# Patient Record
Sex: Female | Born: 1935
Health system: Southern US, Community
[De-identification: ages and names within clinical notes are randomized; demographics above are authoritative.]

## PROBLEM LIST (undated history)

## (undated) DIAGNOSIS — I10 Essential (primary) hypertension: Secondary | ICD-10-CM

## (undated) DIAGNOSIS — R7989 Other specified abnormal findings of blood chemistry: Secondary | ICD-10-CM

## (undated) DIAGNOSIS — M6282 Rhabdomyolysis: Secondary | ICD-10-CM

## (undated) DIAGNOSIS — E785 Hyperlipidemia, unspecified: Secondary | ICD-10-CM

## (undated) DIAGNOSIS — I639 Cerebral infarction, unspecified: Secondary | ICD-10-CM

## (undated) DIAGNOSIS — N289 Disorder of kidney and ureter, unspecified: Secondary | ICD-10-CM

## (undated) HISTORY — DX: Hyperlipidemia, unspecified: E78.5

## (undated) HISTORY — DX: Other specified abnormal findings of blood chemistry: R79.89

---

## 1978-05-18 HISTORY — PX: BREAST SURGERY: SHX581

## 1997-11-07 ENCOUNTER — Ambulatory Visit (HOSPITAL_COMMUNITY): Admission: RE | Admit: 1997-11-07 | Discharge: 1997-11-08 | Payer: Self-pay | Admitting: Cardiology

## 1998-02-20 ENCOUNTER — Encounter: Payer: Self-pay | Admitting: Endocrinology

## 1998-02-20 ENCOUNTER — Ambulatory Visit (HOSPITAL_COMMUNITY): Admission: RE | Admit: 1998-02-20 | Discharge: 1998-02-20 | Payer: Self-pay | Admitting: Endocrinology

## 1998-09-11 ENCOUNTER — Ambulatory Visit (HOSPITAL_COMMUNITY): Admission: RE | Admit: 1998-09-11 | Discharge: 1998-09-11 | Payer: Self-pay | Admitting: *Deleted

## 1999-03-17 ENCOUNTER — Encounter: Payer: Self-pay | Admitting: Endocrinology

## 1999-03-17 ENCOUNTER — Ambulatory Visit (HOSPITAL_COMMUNITY): Admission: RE | Admit: 1999-03-17 | Discharge: 1999-03-17 | Payer: Self-pay | Admitting: Endocrinology

## 1999-06-27 ENCOUNTER — Encounter: Payer: Self-pay | Admitting: Endocrinology

## 1999-06-27 ENCOUNTER — Ambulatory Visit (HOSPITAL_COMMUNITY): Admission: RE | Admit: 1999-06-27 | Discharge: 1999-06-27 | Payer: Self-pay | Admitting: Endocrinology

## 1999-11-25 ENCOUNTER — Ambulatory Visit (HOSPITAL_COMMUNITY): Admission: RE | Admit: 1999-11-25 | Discharge: 1999-11-25 | Payer: Self-pay | Admitting: Cardiology

## 2000-01-08 ENCOUNTER — Encounter: Payer: Self-pay | Admitting: Orthopedic Surgery

## 2000-01-12 ENCOUNTER — Inpatient Hospital Stay (HOSPITAL_COMMUNITY): Admission: RE | Admit: 2000-01-12 | Discharge: 2000-01-16 | Payer: Self-pay | Admitting: Orthopedic Surgery

## 2000-01-16 ENCOUNTER — Inpatient Hospital Stay (HOSPITAL_COMMUNITY)
Admission: RE | Admit: 2000-01-16 | Discharge: 2000-01-23 | Payer: Self-pay | Admitting: Physical Medicine & Rehabilitation

## 2000-01-22 ENCOUNTER — Encounter: Payer: Self-pay | Admitting: Physical Medicine & Rehabilitation

## 2000-03-04 ENCOUNTER — Encounter: Admission: RE | Admit: 2000-03-04 | Discharge: 2000-03-15 | Payer: Self-pay | Admitting: Orthopedic Surgery

## 2000-03-24 ENCOUNTER — Ambulatory Visit (HOSPITAL_COMMUNITY): Admission: RE | Admit: 2000-03-24 | Discharge: 2000-03-24 | Payer: Self-pay | Admitting: Orthopedic Surgery

## 2000-03-24 ENCOUNTER — Encounter: Payer: Self-pay | Admitting: Orthopedic Surgery

## 2000-04-06 ENCOUNTER — Encounter: Payer: Self-pay | Admitting: Orthopedic Surgery

## 2000-04-07 ENCOUNTER — Ambulatory Visit (HOSPITAL_COMMUNITY): Admission: RE | Admit: 2000-04-07 | Discharge: 2000-04-08 | Payer: Self-pay | Admitting: Orthopedic Surgery

## 2000-07-14 ENCOUNTER — Ambulatory Visit (HOSPITAL_BASED_OUTPATIENT_CLINIC_OR_DEPARTMENT_OTHER): Admission: RE | Admit: 2000-07-14 | Discharge: 2000-07-15 | Payer: Self-pay | Admitting: Orthopedic Surgery

## 2000-08-16 ENCOUNTER — Encounter (HOSPITAL_COMMUNITY): Payer: Self-pay | Admitting: Oncology

## 2000-08-16 ENCOUNTER — Ambulatory Visit (HOSPITAL_COMMUNITY): Admission: RE | Admit: 2000-08-16 | Discharge: 2000-08-16 | Payer: Self-pay | Admitting: Oncology

## 2000-09-20 ENCOUNTER — Encounter: Payer: Self-pay | Admitting: Endocrinology

## 2000-09-20 ENCOUNTER — Ambulatory Visit (HOSPITAL_COMMUNITY): Admission: RE | Admit: 2000-09-20 | Discharge: 2000-09-20 | Payer: Self-pay | Admitting: Endocrinology

## 2001-05-14 ENCOUNTER — Encounter: Payer: Self-pay | Admitting: Emergency Medicine

## 2001-05-15 ENCOUNTER — Encounter: Payer: Self-pay | Admitting: Orthopedic Surgery

## 2001-05-15 ENCOUNTER — Observation Stay (HOSPITAL_COMMUNITY): Admission: EM | Admit: 2001-05-15 | Discharge: 2001-05-15 | Payer: Self-pay | Admitting: Emergency Medicine

## 2001-09-21 ENCOUNTER — Encounter: Admission: RE | Admit: 2001-09-21 | Discharge: 2001-09-21 | Payer: Self-pay | Admitting: Endocrinology

## 2001-09-21 ENCOUNTER — Encounter: Payer: Self-pay | Admitting: Endocrinology

## 2001-09-22 ENCOUNTER — Ambulatory Visit (HOSPITAL_COMMUNITY): Admission: RE | Admit: 2001-09-22 | Discharge: 2001-09-22 | Payer: Self-pay | Admitting: *Deleted

## 2001-12-05 ENCOUNTER — Inpatient Hospital Stay (HOSPITAL_COMMUNITY): Admission: RE | Admit: 2001-12-05 | Discharge: 2001-12-07 | Payer: Self-pay | Admitting: Orthopedic Surgery

## 2001-12-05 ENCOUNTER — Encounter: Payer: Self-pay | Admitting: Orthopedic Surgery

## 2002-01-30 ENCOUNTER — Encounter: Admission: RE | Admit: 2002-01-30 | Discharge: 2002-03-29 | Payer: Self-pay | Admitting: Orthopedic Surgery

## 2003-05-03 ENCOUNTER — Ambulatory Visit (HOSPITAL_COMMUNITY): Admission: RE | Admit: 2003-05-03 | Discharge: 2003-05-03 | Payer: Self-pay | Admitting: *Deleted

## 2003-06-27 ENCOUNTER — Encounter: Admission: RE | Admit: 2003-06-27 | Discharge: 2003-06-27 | Payer: Self-pay | Admitting: Endocrinology

## 2003-11-22 ENCOUNTER — Ambulatory Visit (HOSPITAL_COMMUNITY): Admission: RE | Admit: 2003-11-22 | Discharge: 2003-11-22 | Payer: Self-pay | Admitting: Cardiology

## 2003-11-30 ENCOUNTER — Inpatient Hospital Stay (HOSPITAL_COMMUNITY): Admission: RE | Admit: 2003-11-30 | Discharge: 2003-12-04 | Payer: Self-pay | Admitting: Orthopedic Surgery

## 2003-12-31 ENCOUNTER — Encounter: Admission: RE | Admit: 2003-12-31 | Discharge: 2004-02-19 | Payer: Self-pay | Admitting: Orthopedic Surgery

## 2004-08-06 ENCOUNTER — Encounter: Admission: RE | Admit: 2004-08-06 | Discharge: 2004-08-06 | Payer: Self-pay | Admitting: Endocrinology

## 2004-12-24 ENCOUNTER — Ambulatory Visit (HOSPITAL_COMMUNITY): Admission: RE | Admit: 2004-12-24 | Discharge: 2004-12-24 | Payer: Self-pay | Admitting: *Deleted

## 2005-09-18 ENCOUNTER — Encounter: Admission: RE | Admit: 2005-09-18 | Discharge: 2005-09-18 | Payer: Self-pay | Admitting: Endocrinology

## 2011-08-04 DIAGNOSIS — L608 Other nail disorders: Secondary | ICD-10-CM | POA: Diagnosis not present

## 2011-08-04 DIAGNOSIS — E1149 Type 2 diabetes mellitus with other diabetic neurological complication: Secondary | ICD-10-CM | POA: Diagnosis not present

## 2011-10-21 DIAGNOSIS — I10 Essential (primary) hypertension: Secondary | ICD-10-CM | POA: Diagnosis not present

## 2011-10-21 DIAGNOSIS — M109 Gout, unspecified: Secondary | ICD-10-CM | POA: Diagnosis not present

## 2011-10-21 DIAGNOSIS — E789 Disorder of lipoprotein metabolism, unspecified: Secondary | ICD-10-CM | POA: Diagnosis not present

## 2011-10-21 DIAGNOSIS — N390 Urinary tract infection, site not specified: Secondary | ICD-10-CM | POA: Diagnosis not present

## 2011-12-08 DIAGNOSIS — R5381 Other malaise: Secondary | ICD-10-CM | POA: Diagnosis not present

## 2011-12-08 DIAGNOSIS — E789 Disorder of lipoprotein metabolism, unspecified: Secondary | ICD-10-CM | POA: Diagnosis not present

## 2011-12-08 DIAGNOSIS — R5383 Other fatigue: Secondary | ICD-10-CM | POA: Diagnosis not present

## 2011-12-08 DIAGNOSIS — M79609 Pain in unspecified limb: Secondary | ICD-10-CM | POA: Diagnosis not present

## 2011-12-14 DIAGNOSIS — E875 Hyperkalemia: Secondary | ICD-10-CM | POA: Diagnosis not present

## 2011-12-25 DIAGNOSIS — Q828 Other specified congenital malformations of skin: Secondary | ICD-10-CM | POA: Diagnosis not present

## 2011-12-25 DIAGNOSIS — M79609 Pain in unspecified limb: Secondary | ICD-10-CM | POA: Diagnosis not present

## 2011-12-25 DIAGNOSIS — B351 Tinea unguium: Secondary | ICD-10-CM | POA: Diagnosis not present

## 2012-03-25 DIAGNOSIS — Q828 Other specified congenital malformations of skin: Secondary | ICD-10-CM | POA: Diagnosis not present

## 2012-03-25 DIAGNOSIS — M79609 Pain in unspecified limb: Secondary | ICD-10-CM | POA: Diagnosis not present

## 2012-03-25 DIAGNOSIS — B351 Tinea unguium: Secondary | ICD-10-CM | POA: Diagnosis not present

## 2012-04-13 DIAGNOSIS — E789 Disorder of lipoprotein metabolism, unspecified: Secondary | ICD-10-CM | POA: Diagnosis not present

## 2012-04-13 DIAGNOSIS — I10 Essential (primary) hypertension: Secondary | ICD-10-CM | POA: Diagnosis not present

## 2012-04-21 DIAGNOSIS — Z853 Personal history of malignant neoplasm of breast: Secondary | ICD-10-CM | POA: Diagnosis not present

## 2012-04-21 DIAGNOSIS — C50919 Malignant neoplasm of unspecified site of unspecified female breast: Secondary | ICD-10-CM | POA: Diagnosis not present

## 2012-04-21 DIAGNOSIS — I700 Atherosclerosis of aorta: Secondary | ICD-10-CM | POA: Diagnosis not present

## 2012-04-21 DIAGNOSIS — IMO0001 Reserved for inherently not codable concepts without codable children: Secondary | ICD-10-CM | POA: Diagnosis not present

## 2012-04-21 DIAGNOSIS — M949 Disorder of cartilage, unspecified: Secondary | ICD-10-CM | POA: Diagnosis not present

## 2012-04-21 DIAGNOSIS — M899 Disorder of bone, unspecified: Secondary | ICD-10-CM | POA: Diagnosis not present

## 2012-04-21 DIAGNOSIS — E789 Disorder of lipoprotein metabolism, unspecified: Secondary | ICD-10-CM | POA: Diagnosis not present

## 2012-06-22 DIAGNOSIS — I10 Essential (primary) hypertension: Secondary | ICD-10-CM | POA: Diagnosis not present

## 2012-06-22 DIAGNOSIS — E789 Disorder of lipoprotein metabolism, unspecified: Secondary | ICD-10-CM | POA: Diagnosis not present

## 2012-06-22 DIAGNOSIS — M109 Gout, unspecified: Secondary | ICD-10-CM | POA: Diagnosis not present

## 2012-07-29 DIAGNOSIS — Q828 Other specified congenital malformations of skin: Secondary | ICD-10-CM | POA: Diagnosis not present

## 2012-07-29 DIAGNOSIS — E1149 Type 2 diabetes mellitus with other diabetic neurological complication: Secondary | ICD-10-CM | POA: Diagnosis not present

## 2012-09-21 DIAGNOSIS — M109 Gout, unspecified: Secondary | ICD-10-CM | POA: Diagnosis not present

## 2012-09-21 DIAGNOSIS — E789 Disorder of lipoprotein metabolism, unspecified: Secondary | ICD-10-CM | POA: Diagnosis not present

## 2012-09-21 DIAGNOSIS — I10 Essential (primary) hypertension: Secondary | ICD-10-CM | POA: Diagnosis not present

## 2012-11-08 DIAGNOSIS — L600 Ingrowing nail: Secondary | ICD-10-CM | POA: Diagnosis not present

## 2012-11-08 DIAGNOSIS — E1149 Type 2 diabetes mellitus with other diabetic neurological complication: Secondary | ICD-10-CM | POA: Diagnosis not present

## 2012-11-08 DIAGNOSIS — Q828 Other specified congenital malformations of skin: Secondary | ICD-10-CM | POA: Diagnosis not present

## 2012-11-30 ENCOUNTER — Other Ambulatory Visit: Payer: Self-pay | Admitting: Endocrinology

## 2012-11-30 DIAGNOSIS — I10 Essential (primary) hypertension: Secondary | ICD-10-CM | POA: Diagnosis not present

## 2012-11-30 DIAGNOSIS — R1310 Dysphagia, unspecified: Secondary | ICD-10-CM

## 2012-11-30 DIAGNOSIS — M109 Gout, unspecified: Secondary | ICD-10-CM | POA: Diagnosis not present

## 2012-11-30 DIAGNOSIS — E789 Disorder of lipoprotein metabolism, unspecified: Secondary | ICD-10-CM | POA: Diagnosis not present

## 2012-11-30 DIAGNOSIS — C50919 Malignant neoplasm of unspecified site of unspecified female breast: Secondary | ICD-10-CM | POA: Diagnosis not present

## 2012-12-09 ENCOUNTER — Other Ambulatory Visit: Payer: Self-pay

## 2012-12-21 DIAGNOSIS — M109 Gout, unspecified: Secondary | ICD-10-CM | POA: Diagnosis not present

## 2012-12-21 DIAGNOSIS — I10 Essential (primary) hypertension: Secondary | ICD-10-CM | POA: Diagnosis not present

## 2012-12-21 DIAGNOSIS — IMO0001 Reserved for inherently not codable concepts without codable children: Secondary | ICD-10-CM | POA: Diagnosis not present

## 2013-01-04 DIAGNOSIS — M109 Gout, unspecified: Secondary | ICD-10-CM | POA: Diagnosis not present

## 2013-01-04 DIAGNOSIS — I10 Essential (primary) hypertension: Secondary | ICD-10-CM | POA: Diagnosis not present

## 2013-01-04 DIAGNOSIS — M353 Polymyalgia rheumatica: Secondary | ICD-10-CM | POA: Diagnosis not present

## 2013-02-07 DIAGNOSIS — Q828 Other specified congenital malformations of skin: Secondary | ICD-10-CM | POA: Diagnosis not present

## 2013-02-07 DIAGNOSIS — E1149 Type 2 diabetes mellitus with other diabetic neurological complication: Secondary | ICD-10-CM | POA: Diagnosis not present

## 2013-04-06 DIAGNOSIS — M79609 Pain in unspecified limb: Secondary | ICD-10-CM | POA: Diagnosis not present

## 2013-04-06 DIAGNOSIS — R82998 Other abnormal findings in urine: Secondary | ICD-10-CM | POA: Diagnosis not present

## 2013-04-06 DIAGNOSIS — Z23 Encounter for immunization: Secondary | ICD-10-CM | POA: Diagnosis not present

## 2013-04-06 DIAGNOSIS — M109 Gout, unspecified: Secondary | ICD-10-CM | POA: Diagnosis not present

## 2013-05-16 ENCOUNTER — Ambulatory Visit (INDEPENDENT_AMBULATORY_CARE_PROVIDER_SITE_OTHER): Payer: Medicare Other

## 2013-05-16 VITALS — BP 169/70 | HR 80 | Resp 12

## 2013-05-16 DIAGNOSIS — M2040 Other hammer toe(s) (acquired), unspecified foot: Secondary | ICD-10-CM

## 2013-05-16 DIAGNOSIS — E1142 Type 2 diabetes mellitus with diabetic polyneuropathy: Secondary | ICD-10-CM | POA: Diagnosis not present

## 2013-05-16 DIAGNOSIS — Q828 Other specified congenital malformations of skin: Secondary | ICD-10-CM | POA: Diagnosis not present

## 2013-05-16 DIAGNOSIS — E1140 Type 2 diabetes mellitus with diabetic neuropathy, unspecified: Secondary | ICD-10-CM

## 2013-05-16 DIAGNOSIS — L608 Other nail disorders: Secondary | ICD-10-CM

## 2013-05-16 DIAGNOSIS — M79609 Pain in unspecified limb: Secondary | ICD-10-CM

## 2013-05-16 DIAGNOSIS — E1149 Type 2 diabetes mellitus with other diabetic neurological complication: Secondary | ICD-10-CM | POA: Diagnosis not present

## 2013-05-16 DIAGNOSIS — M2041 Other hammer toe(s) (acquired), right foot: Secondary | ICD-10-CM

## 2013-05-16 NOTE — Progress Notes (Signed)
   Subjective:    Patient ID: Toni Gomez, female    DOB: September 02, 1923, 79 y.o.   MRN: [id number]  HPI Comments: ''Toenails and corn trim.''     Review of Systems deferred at this visit     Objective:   Physical Exam Neurovascular status is intact as follows pedal pulses palpable dorsalis pedis pulse one over 4 bilateral PT thready nonpalpable bilateral. Capillary fill time 4 seconds all digits. Skin temperature warm turgor diminished no edema rubor noted mild varicosities noted bilateral. Neurologically epicritic and proprioceptive sensations intact and symmetric bilateral sensations diminished on Semmes Weinstein testing to the digits and plantar forefoot. Orthopedic biomechanical ex deviation digits overlapping is noted. The digits are rigidly contracted. There is associated keratoses distal clavus second digit right foot due to hammertoe deformity there is hemorrhage a keratoses no acute ulceration noted.am remarkable for severe digiDermatologically nails thick brittle crumbly friable discolored x10 also distal clavus second right. No open wounds or ulcerations occurred visit. Digital contractures HAV deformity bilateral       Assessment & Plan:  Assessment this time his diabetes with peripheral neuropathy. Severe arthropathy digital contractures HAV deformity bilateral. Nails thick brittle friable criptotic debrided x10 the presence of diabetes and complications. Also debridement of distal clavus second right and dispensed tubercle padding to cushion the toe. Maintain accommodative shoe gear reappointed in 3 months for continued palliative care  Alvan Dame DPM

## 2013-05-16 NOTE — Patient Instructions (Signed)
Diabetes and Foot Care Diabetes may cause you to have problems because of poor blood supply (circulation) to your feet and legs. This may cause the skin on your feet to become thinner, break easier, and heal more slowly. Your skin may become dry, and the skin may peel and crack. You may also have nerve damage in your legs and feet causing decreased feeling in them. You may not notice minor injuries to your feet that could lead to infections or more serious problems. Taking care of your feet is one of the most important things you can do for yourself.  HOME CARE INSTRUCTIONS  Wear shoes at all times, even in the house. Do not go barefoot. Bare feet are easily injured.  Check your feet daily for blisters, cuts, and redness. If you cannot see the bottom of your feet, use a mirror or ask someone for help.  Wash your feet with warm water (do not use hot water) and mild soap. Then pat your feet and the areas between your toes until they are completely dry. Do not soak your feet as this can dry your skin.  Apply a moisturizing lotion or petroleum jelly (that does not contain alcohol and is unscented) to the skin on your feet and to dry, brittle toenails. Do not apply lotion between your toes.  Trim your toenails straight across. Do not dig under them or around the cuticle. File the edges of your nails with an emery board or nail file.  Do not cut corns or calluses or try to remove them with medicine.  Wear clean socks or stockings every day. Make sure they are not too tight. Do not wear knee-high stockings since they may decrease blood flow to your legs.  Wear shoes that fit properly and have enough cushioning. To break in new shoes, wear them for just a few hours a day. This prevents you from injuring your feet. Always look in your shoes before you put them on to be sure there are no objects inside.  Do not cross your legs. This may decrease the blood flow to your feet.  If you find a minor scrape,  cut, or break in the skin on your feet, keep it and the skin around it clean and dry. These areas may be cleansed with mild soap and water. Do not cleanse the area with peroxide, alcohol, or iodine.  When you remove an adhesive bandage, be sure not to damage the skin around it.  If you have a wound, look at it several times a day to make sure it is healing.  Do not use heating pads or hot water bottles. They may burn your skin. If you have lost feeling in your feet or legs, you may not know it is happening until it is too late.  Make sure your health care provider performs a complete foot exam at least annually or more often if you have foot problems. Report any cuts, sores, or bruises to your health care provider immediately. SEEK MEDICAL CARE IF:   You have an injury that is not healing.  You have cuts or breaks in the skin.  You have an ingrown nail.  You notice redness on your legs or feet.  You feel burning or tingling in your legs or feet.  You have pain or cramps in your legs and feet.  Your legs or feet are numb.  Your feet always feel cold. SEEK IMMEDIATE MEDICAL CARE IF:   There is increasing redness,   swelling, or pain in or around a wound.  There is a red line that goes up your leg.  Pus is coming from a wound.  You develop a fever or as directed by your health care provider.  You notice a bad smell coming from an ulcer or wound. Document Released: 05/01/2000 Document Revised: 01/04/2013 Document Reviewed: 10/11/2012 [employer] Patient Information 2014 [employer].  

## 2013-05-22 DIAGNOSIS — Z79899 Other long term (current) drug therapy: Secondary | ICD-10-CM | POA: Diagnosis not present

## 2013-05-22 DIAGNOSIS — I10 Essential (primary) hypertension: Secondary | ICD-10-CM | POA: Diagnosis not present

## 2013-05-29 DIAGNOSIS — M79609 Pain in unspecified limb: Secondary | ICD-10-CM | POA: Diagnosis not present

## 2013-05-29 DIAGNOSIS — I10 Essential (primary) hypertension: Secondary | ICD-10-CM | POA: Diagnosis not present

## 2013-05-29 DIAGNOSIS — R0789 Other chest pain: Secondary | ICD-10-CM | POA: Diagnosis not present

## 2013-05-29 DIAGNOSIS — R0602 Shortness of breath: Secondary | ICD-10-CM | POA: Diagnosis not present

## 2013-05-29 DIAGNOSIS — R0609 Other forms of dyspnea: Secondary | ICD-10-CM | POA: Diagnosis not present

## 2013-06-22 DIAGNOSIS — H6120 Impacted cerumen, unspecified ear: Secondary | ICD-10-CM | POA: Diagnosis not present

## 2013-08-04 ENCOUNTER — Ambulatory Visit: Payer: Medicare Other

## 2013-08-21 DIAGNOSIS — I10 Essential (primary) hypertension: Secondary | ICD-10-CM | POA: Diagnosis not present

## 2013-08-23 ENCOUNTER — Ambulatory Visit: Payer: Medicare Other

## 2013-08-28 DIAGNOSIS — M79609 Pain in unspecified limb: Secondary | ICD-10-CM | POA: Diagnosis not present

## 2013-08-28 DIAGNOSIS — M109 Gout, unspecified: Secondary | ICD-10-CM | POA: Diagnosis not present

## 2013-08-28 DIAGNOSIS — I10 Essential (primary) hypertension: Secondary | ICD-10-CM | POA: Diagnosis not present

## 2013-08-28 DIAGNOSIS — R609 Edema, unspecified: Secondary | ICD-10-CM | POA: Diagnosis not present

## 2013-08-31 DIAGNOSIS — H6120 Impacted cerumen, unspecified ear: Secondary | ICD-10-CM | POA: Diagnosis not present

## 2013-09-19 ENCOUNTER — Ambulatory Visit (INDEPENDENT_AMBULATORY_CARE_PROVIDER_SITE_OTHER): Payer: Medicare Other

## 2013-09-19 VITALS — BP 111/70 | HR 64 | Resp 16

## 2013-09-19 DIAGNOSIS — B351 Tinea unguium: Secondary | ICD-10-CM | POA: Diagnosis not present

## 2013-09-19 DIAGNOSIS — M79609 Pain in unspecified limb: Secondary | ICD-10-CM

## 2013-09-19 DIAGNOSIS — L608 Other nail disorders: Secondary | ICD-10-CM

## 2013-09-19 DIAGNOSIS — Q828 Other specified congenital malformations of skin: Secondary | ICD-10-CM | POA: Diagnosis not present

## 2013-09-19 NOTE — Progress Notes (Signed)
   Subjective:    Patient ID: Toni Gomez, female    DOB: [birth year], 80 y.o.   MRN: [id number]  HPI Comments: "Trim the toenails and that corn"  Corn:  2nd toe right (tip)     Review of Systems no new systemic changes or findings no    Objective:   Physical Exam 80 year old feel is at this time for followup care of well-developed well-nourished oriented x3 patient does have intact pedal pulses DP plus one over 4 bilateral PT thready nonpalpable bilateral capillary fill time 4 seconds all digits skin temperature is warm turgor diminished no edema rubor pallor noted mild varicosities noted bilateral. Neurologically epicritic and proprioceptive sensations are intact and symmetric but diminished sensation to toes plantar foot on Semmes Weinstein testing patient also has rigid digital contractures and HAV deformity left more so than right there is severe distal clavus second digit right foot third digit left foot with associated keratoses distal tuft the digit with hemorrhage a keratoses almost pre-ulcerative keratoses subsecond right. Asked cushioning in the past and has had previous debridement. Open wounds no active ulcerations no infections no secondary lesions noted no ascending size lymphangitis and severe arthrosis and HAV deformity and digital contractures with abnormality gait. Nails thick brittle friable criptotic incurvated 1 through 5 bilateral painful both enclosed shoe and ambulation       Assessment & Plan:  Assessment this time is onychomycosis painful mycotic nails debrided 1 through 5 bilateral also painful distal clavus second digit right foot is debrided single keratotic lesion painful nucleated lesion is debrided posse noncovered service patient had previously been identified his diabetic are this time presents indicating she does not have diabetes according to her primary physician Dr.,.Kohut. This time nails are debrided [place] is single keratotic lesion some tube foam padding  applied to the second toe right foot reappointed 3 months for continued palliative mycotic nail care in the future as needed  Harriet Masson DPM

## 2013-09-19 NOTE — Patient Instructions (Signed)

## 2013-11-02 DIAGNOSIS — N390 Urinary tract infection, site not specified: Secondary | ICD-10-CM | POA: Diagnosis not present

## 2013-11-02 DIAGNOSIS — M109 Gout, unspecified: Secondary | ICD-10-CM | POA: Diagnosis not present

## 2013-11-02 DIAGNOSIS — I10 Essential (primary) hypertension: Secondary | ICD-10-CM | POA: Diagnosis not present

## 2013-11-02 DIAGNOSIS — G4700 Insomnia, unspecified: Secondary | ICD-10-CM | POA: Diagnosis not present

## 2013-11-23 DIAGNOSIS — I10 Essential (primary) hypertension: Secondary | ICD-10-CM | POA: Diagnosis not present

## 2013-11-29 DIAGNOSIS — M109 Gout, unspecified: Secondary | ICD-10-CM | POA: Diagnosis not present

## 2013-11-29 DIAGNOSIS — R5383 Other fatigue: Secondary | ICD-10-CM | POA: Diagnosis not present

## 2013-11-29 DIAGNOSIS — N19 Unspecified kidney failure: Secondary | ICD-10-CM | POA: Diagnosis not present

## 2013-11-29 DIAGNOSIS — I10 Essential (primary) hypertension: Secondary | ICD-10-CM | POA: Diagnosis not present

## 2013-11-29 DIAGNOSIS — R5381 Other malaise: Secondary | ICD-10-CM | POA: Diagnosis not present

## 2013-11-29 DIAGNOSIS — N390 Urinary tract infection, site not specified: Secondary | ICD-10-CM | POA: Diagnosis not present

## 2013-12-11 DIAGNOSIS — I10 Essential (primary) hypertension: Secondary | ICD-10-CM | POA: Diagnosis not present

## 2013-12-11 DIAGNOSIS — Z79899 Other long term (current) drug therapy: Secondary | ICD-10-CM | POA: Diagnosis not present

## 2013-12-13 DIAGNOSIS — R634 Abnormal weight loss: Secondary | ICD-10-CM | POA: Diagnosis not present

## 2013-12-13 DIAGNOSIS — I10 Essential (primary) hypertension: Secondary | ICD-10-CM | POA: Diagnosis not present

## 2013-12-13 DIAGNOSIS — M109 Gout, unspecified: Secondary | ICD-10-CM | POA: Diagnosis not present

## 2013-12-19 ENCOUNTER — Ambulatory Visit: Payer: Medicare Other

## 2014-01-08 DIAGNOSIS — I10 Essential (primary) hypertension: Secondary | ICD-10-CM | POA: Diagnosis not present

## 2014-01-08 DIAGNOSIS — Z79899 Other long term (current) drug therapy: Secondary | ICD-10-CM | POA: Diagnosis not present

## 2014-01-15 DIAGNOSIS — I10 Essential (primary) hypertension: Secondary | ICD-10-CM | POA: Diagnosis not present

## 2014-01-15 DIAGNOSIS — R630 Anorexia: Secondary | ICD-10-CM | POA: Diagnosis not present

## 2014-01-15 DIAGNOSIS — M109 Gout, unspecified: Secondary | ICD-10-CM | POA: Diagnosis not present

## 2014-01-16 ENCOUNTER — Ambulatory Visit (INDEPENDENT_AMBULATORY_CARE_PROVIDER_SITE_OTHER): Payer: Medicare Other

## 2014-01-16 DIAGNOSIS — B351 Tinea unguium: Secondary | ICD-10-CM

## 2014-01-16 DIAGNOSIS — Q828 Other specified congenital malformations of skin: Secondary | ICD-10-CM

## 2014-01-16 DIAGNOSIS — E1142 Type 2 diabetes mellitus with diabetic polyneuropathy: Secondary | ICD-10-CM | POA: Diagnosis not present

## 2014-01-16 DIAGNOSIS — L608 Other nail disorders: Secondary | ICD-10-CM

## 2014-01-16 DIAGNOSIS — E1149 Type 2 diabetes mellitus with other diabetic neurological complication: Secondary | ICD-10-CM | POA: Diagnosis not present

## 2014-01-16 DIAGNOSIS — M79676 Pain in unspecified toe(s): Secondary | ICD-10-CM

## 2014-01-16 DIAGNOSIS — E1140 Type 2 diabetes mellitus with diabetic neuropathy, unspecified: Secondary | ICD-10-CM

## 2014-01-16 DIAGNOSIS — M79609 Pain in unspecified limb: Secondary | ICD-10-CM

## 2014-01-16 DIAGNOSIS — M2041 Other hammer toe(s) (acquired), right foot: Secondary | ICD-10-CM

## 2014-01-16 DIAGNOSIS — M2040 Other hammer toe(s) (acquired), unspecified foot: Secondary | ICD-10-CM

## 2014-01-16 NOTE — Progress Notes (Signed)
   Subjective:    Patient ID: Toni Gomez, female    DOB: Mar 19, 1924, 80 y.o.   MRN: [id number]  HPI Comments: Pt request 10 toenails to be trimmed and the left 5th toe corn.     Review of Systems no new findings or systemic changes noted     Objective:   Physical Exam Lower extremity objective findings as follows vascular status is intact although diminished DP one over 4 bilateral PT thready nonpalpable bilateral capillary refill time 4 seconds all digits epicritic sensation diminished on Semmes Weinstein to forefoot digits and arch. Patient has a rigid digital contractures with arthritic changes the MTP and IP joints of digits there is distal clavus keratoses second digit right foot and diffuse keratosis and remaining digits left foot. The keratoses distal second right is pre-ulcerative no open wound ulcer no secondary infection is noted nails thick brittle yellow discolored and friable consistent with onychomycosis and proptosis of nails.       Assessment & Plan:  Assessment this time is onychomycosis painful mycotic dystrophic nails debridement presence of pain and complicating factors also single keratotic lesion distal tuft second digit right foot is debrided and the presence of pain in symptomology. Return in 3 months for long-term followup and appropriate continued palliative foot nail care in the future as needed next  Harriet Masson DPM

## 2014-01-16 NOTE — Patient Instructions (Signed)
Diabetes and Foot Care Diabetes may cause you to have problems because of poor blood supply (circulation) to your feet and legs. This may cause the skin on your feet to become thinner, break easier, and heal more slowly. Your skin may become dry, and the skin may peel and crack. You may also have nerve damage in your legs and feet causing decreased feeling in them. You may not notice minor injuries to your feet that could lead to infections or more serious problems. Taking care of your feet is one of the most important things you can do for yourself.  HOME CARE INSTRUCTIONS  Wear shoes at all times, even in the house. Do not go barefoot. Bare feet are easily injured.  Check your feet daily for blisters, cuts, and redness. If you cannot see the bottom of your feet, use a mirror or ask someone for help.  Wash your feet with warm water (do not use hot water) and mild soap. Then pat your feet and the areas between your toes until they are completely dry. Do not soak your feet as this can dry your skin.  Apply a moisturizing lotion or petroleum jelly (that does not contain alcohol and is unscented) to the skin on your feet and to dry, brittle toenails. Do not apply lotion between your toes.  Trim your toenails straight across. Do not dig under them or around the cuticle. File the edges of your nails with an emery board or nail file.  Do not cut corns or calluses or try to remove them with medicine.  Wear clean socks or stockings every day. Make sure they are not too tight. Do not wear knee-high stockings since they may decrease blood flow to your legs.  Wear shoes that fit properly and have enough cushioning. To break in new shoes, wear them for just a few hours a day. This prevents you from injuring your feet. Always look in your shoes before you put them on to be sure there are no objects inside.  Do not cross your legs. This may decrease the blood flow to your feet.  If you find a minor scrape,  cut, or break in the skin on your feet, keep it and the skin around it clean and dry. These areas may be cleansed with mild soap and water. Do not cleanse the area with peroxide, alcohol, or iodine.  When you remove an adhesive bandage, be sure not to damage the skin around it.  If you have a wound, look at it several times a day to make sure it is healing.  Do not use heating pads or hot water bottles. They may burn your skin. If you have lost feeling in your feet or legs, you may not know it is happening until it is too late.  Make sure your health care provider performs a complete foot exam at least annually or more often if you have foot problems. Report any cuts, sores, or bruises to your health care provider immediately. SEEK MEDICAL CARE IF:   You have an injury that is not healing.  You have cuts or breaks in the skin.  You have an ingrown nail.  You notice redness on your legs or feet.  You feel burning or tingling in your legs or feet.  You have pain or cramps in your legs and feet.  Your legs or feet are numb.  Your feet always feel cold. SEEK IMMEDIATE MEDICAL CARE IF:   There is increasing redness,   swelling, or pain in or around a wound.  There is a red line that goes up your leg.  Pus is coming from a wound.  You develop a fever or as directed by your health care provider.  You notice a bad smell coming from an ulcer or wound. Document Released: 05/01/2000 Document Revised: 01/04/2013 Document Reviewed: 10/11/2012 [employer] Patient Information 2015 [employer]. This information is not intended to replace advice given to you by your health care provider. Make sure you discuss any questions you have with your health care provider.  

## 2014-03-29 DIAGNOSIS — M25569 Pain in unspecified knee: Secondary | ICD-10-CM | POA: Diagnosis not present

## 2014-03-29 DIAGNOSIS — I10 Essential (primary) hypertension: Secondary | ICD-10-CM | POA: Diagnosis not present

## 2014-03-29 DIAGNOSIS — M109 Gout, unspecified: Secondary | ICD-10-CM | POA: Diagnosis not present

## 2014-03-30 ENCOUNTER — Other Ambulatory Visit: Payer: Self-pay | Admitting: Endocrinology

## 2014-03-30 DIAGNOSIS — R1310 Dysphagia, unspecified: Secondary | ICD-10-CM

## 2014-04-17 ENCOUNTER — Ambulatory Visit: Payer: Medicare Other

## 2014-05-04 ENCOUNTER — Ambulatory Visit
Admission: RE | Admit: 2014-05-04 | Discharge: 2014-05-04 | Disposition: A | Discharge status: Home / Self Care | Payer: Medicare Other | Source: Ambulatory Visit | Attending: Endocrinology | Admitting: Endocrinology

## 2014-05-04 DIAGNOSIS — R1310 Dysphagia, unspecified: Secondary | ICD-10-CM

## 2014-05-04 DIAGNOSIS — K224 Dyskinesia of esophagus: Secondary | ICD-10-CM | POA: Diagnosis not present

## 2014-05-04 DIAGNOSIS — K225 Diverticulum of esophagus, acquired: Secondary | ICD-10-CM | POA: Diagnosis not present

## 2014-05-15 ENCOUNTER — Ambulatory Visit: Payer: Medicare Other

## 2014-05-22 ENCOUNTER — Ambulatory Visit (INDEPENDENT_AMBULATORY_CARE_PROVIDER_SITE_OTHER): Payer: Medicare Other

## 2014-05-22 DIAGNOSIS — M79676 Pain in unspecified toe(s): Secondary | ICD-10-CM

## 2014-05-22 DIAGNOSIS — B351 Tinea unguium: Secondary | ICD-10-CM | POA: Diagnosis not present

## 2014-05-22 DIAGNOSIS — Q828 Other specified congenital malformations of skin: Secondary | ICD-10-CM | POA: Diagnosis not present

## 2014-05-22 DIAGNOSIS — E1140 Type 2 diabetes mellitus with diabetic neuropathy, unspecified: Secondary | ICD-10-CM

## 2014-05-22 NOTE — Progress Notes (Signed)
   Subjective:    Patient ID: Toni Gomez, female    DOB: Oct 11, 1923, 81 y.o.   MRN: [id number]  HPI Comments: "Cut the toenails"  Callus: 2nd toe right (tip)     Review of Systems no new findings or systemic changes noted     Objective:   Physical Exam No other new changes vascular status reveals pedal pulses diminished DP plus one over 4 PT thready nonpalpable bilateral noted HAV deformity hammertoe deformity bilateral distal clavus second right no open wounds no ulcers the to pan has been helping the calluses been over 3 months as debridement nails thick brittle [place] friable dystrophic and discolored all debrided this time and the presence of pain in symptomology and onychomycosis as well vascular compromise. No secondary infections no open wounds no cellulitis noted       Assessment & Plan:  Multiple dystrophic painful mycotic nails debrided 1 through 5 bilateral to foam padding applied to the second right after debridement of distal clavus second digit return for future palliative care is needed likely 3 month follow-up recommended  Harriet Masson DPM

## 2014-06-05 ENCOUNTER — Ambulatory Visit: Payer: Medicare Other

## 2014-07-24 DIAGNOSIS — I10 Essential (primary) hypertension: Secondary | ICD-10-CM | POA: Diagnosis not present

## 2014-07-30 DIAGNOSIS — E790 Hyperuricemia without signs of inflammatory arthritis and tophaceous disease: Secondary | ICD-10-CM | POA: Diagnosis not present

## 2014-07-30 DIAGNOSIS — I10 Essential (primary) hypertension: Secondary | ICD-10-CM | POA: Diagnosis not present

## 2014-08-28 ENCOUNTER — Ambulatory Visit: Payer: Medicare Other

## 2014-10-16 ENCOUNTER — Encounter: Payer: Self-pay | Admitting: Podiatry

## 2014-10-16 ENCOUNTER — Ambulatory Visit (INDEPENDENT_AMBULATORY_CARE_PROVIDER_SITE_OTHER): Payer: Medicare Other | Admitting: Podiatry

## 2014-10-16 DIAGNOSIS — M79676 Pain in unspecified toe(s): Secondary | ICD-10-CM

## 2014-10-16 DIAGNOSIS — B351 Tinea unguium: Secondary | ICD-10-CM | POA: Diagnosis not present

## 2014-10-16 NOTE — Progress Notes (Signed)
Patient ID: Toni Gomez, female   DOB: 11-06-1923, 81 y.o.   MRN: [id number] Complaint:  Visit Type: Patient returns to my office for continued preventative foot care services. Complaint: Patient states" my nails have grown long and thick and become painful to walk and wear shoes"  She presents for preventative foot care services. No changes to ROS  Podiatric Exam: Vascular: dorsalis pedis and posterior tibial pulses are palpable bilateral. Capillary return is immediate. Temperature gradient is WNL. Skin turgor WNL  Sensorium: Normal Semmes Weinstein monofilament test. Normal tactile sensation bilaterally. Nail Exam: Pt has thick disfigured discolored nails with subungual debris noted bilateral entire nail hallux through fifth toenails Ulcer Exam: There is no evidence of ulcer or pre-ulcerative changes or infection. Orthopedic Exam: Muscle tone and strength are WNL. No limitations in general ROM. No crepitus or effusions noted. Foot type and digits show no abnormalities. Bony prominences are unremarkable. Asymptomatic HAV B/L. Skin: No Porokeratosis. No infection or ulcers  Diagnosis:  Tinea unguium, Pain in right toe, pain in left toes  Treatment & Plan Procedures and Treatment: Consent by patient was obtained for treatment procedures. The patient understood the discussion of treatment and procedures well. All questions were answered thoroughly reviewed. Debridement of mycotic and hypertrophic toenails, 1 through 5 bilateral and clearing of subungual debris. No ulceration, no infection noted.  Return Visit-Office Procedure: Patient instructed to return to the office for a follow up visit 3 months for continued evaluation and treatment.

## 2014-10-29 DIAGNOSIS — Z0000 Encounter for general adult medical examination without abnormal findings: Secondary | ICD-10-CM | POA: Diagnosis not present

## 2014-10-29 DIAGNOSIS — I10 Essential (primary) hypertension: Secondary | ICD-10-CM | POA: Diagnosis not present

## 2014-10-29 DIAGNOSIS — N390 Urinary tract infection, site not specified: Secondary | ICD-10-CM | POA: Diagnosis not present

## 2014-10-29 DIAGNOSIS — Z79899 Other long term (current) drug therapy: Secondary | ICD-10-CM | POA: Diagnosis not present

## 2014-10-29 DIAGNOSIS — E756 Lipid storage disorder, unspecified: Secondary | ICD-10-CM | POA: Diagnosis not present

## 2014-11-05 DIAGNOSIS — M109 Gout, unspecified: Secondary | ICD-10-CM | POA: Diagnosis not present

## 2014-11-05 DIAGNOSIS — I739 Peripheral vascular disease, unspecified: Secondary | ICD-10-CM | POA: Diagnosis not present

## 2014-11-05 DIAGNOSIS — N390 Urinary tract infection, site not specified: Secondary | ICD-10-CM | POA: Diagnosis not present

## 2014-12-04 DIAGNOSIS — M25552 Pain in left hip: Secondary | ICD-10-CM | POA: Diagnosis not present

## 2014-12-04 DIAGNOSIS — S79912A Unspecified injury of left hip, initial encounter: Secondary | ICD-10-CM | POA: Diagnosis not present

## 2014-12-04 DIAGNOSIS — R634 Abnormal weight loss: Secondary | ICD-10-CM | POA: Diagnosis not present

## 2015-01-22 ENCOUNTER — Ambulatory Visit: Payer: Medicare Other | Admitting: Podiatry

## 2015-01-29 DIAGNOSIS — I10 Essential (primary) hypertension: Secondary | ICD-10-CM | POA: Diagnosis not present

## 2015-01-29 DIAGNOSIS — N390 Urinary tract infection, site not specified: Secondary | ICD-10-CM | POA: Diagnosis not present

## 2015-01-29 DIAGNOSIS — E789 Disorder of lipoprotein metabolism, unspecified: Secondary | ICD-10-CM | POA: Diagnosis not present

## 2015-01-31 ENCOUNTER — Ambulatory Visit (INDEPENDENT_AMBULATORY_CARE_PROVIDER_SITE_OTHER): Payer: Medicare Other | Admitting: Podiatry

## 2015-01-31 ENCOUNTER — Encounter: Payer: Self-pay | Admitting: Podiatry

## 2015-01-31 DIAGNOSIS — M79676 Pain in unspecified toe(s): Secondary | ICD-10-CM | POA: Diagnosis not present

## 2015-01-31 DIAGNOSIS — B351 Tinea unguium: Secondary | ICD-10-CM

## 2015-01-31 NOTE — Progress Notes (Signed)
She presents today with a chief complaint of painful elongated toenails. She states that she would like to have them cut.  Objective: Vital signs are stable she is alert and oriented 3 pulses are strongly palpable bilateral. Neurologic sensorium is intact. Her nails are thick yellow dystrophic onychomycotic and painfully elongated. She has hammertoe deformities and foot deformities associated with hallux valgus and pes planus.  Assessment: Pain in limb secondary to onychomycosis.  Plan: Debridement of nails 1 through 5 bilateral cover service secondary to pain.

## 2015-02-05 DIAGNOSIS — N390 Urinary tract infection, site not specified: Secondary | ICD-10-CM | POA: Diagnosis not present

## 2015-02-05 DIAGNOSIS — I10 Essential (primary) hypertension: Secondary | ICD-10-CM | POA: Diagnosis not present

## 2015-02-05 DIAGNOSIS — R634 Abnormal weight loss: Secondary | ICD-10-CM | POA: Diagnosis not present

## 2015-02-05 DIAGNOSIS — E790 Hyperuricemia without signs of inflammatory arthritis and tophaceous disease: Secondary | ICD-10-CM | POA: Diagnosis not present

## 2015-04-01 DIAGNOSIS — I10 Essential (primary) hypertension: Secondary | ICD-10-CM | POA: Diagnosis not present

## 2015-04-01 DIAGNOSIS — E789 Disorder of lipoprotein metabolism, unspecified: Secondary | ICD-10-CM | POA: Diagnosis not present

## 2015-04-08 DIAGNOSIS — F419 Anxiety disorder, unspecified: Secondary | ICD-10-CM | POA: Diagnosis not present

## 2015-04-08 DIAGNOSIS — I10 Essential (primary) hypertension: Secondary | ICD-10-CM | POA: Diagnosis not present

## 2015-04-08 DIAGNOSIS — F5101 Primary insomnia: Secondary | ICD-10-CM | POA: Diagnosis not present

## 2015-05-07 ENCOUNTER — Ambulatory Visit (INDEPENDENT_AMBULATORY_CARE_PROVIDER_SITE_OTHER): Payer: Medicare Other | Admitting: Podiatry

## 2015-05-07 ENCOUNTER — Encounter: Payer: Self-pay | Admitting: Podiatry

## 2015-05-07 DIAGNOSIS — M79676 Pain in unspecified toe(s): Secondary | ICD-10-CM | POA: Diagnosis not present

## 2015-05-07 DIAGNOSIS — B351 Tinea unguium: Secondary | ICD-10-CM | POA: Diagnosis not present

## 2015-05-07 NOTE — Progress Notes (Signed)
Patient ID: Toni Gomez, female   DOB: 1923-10-17, 81 y.o.   MRN: [id number] Complaint:  Visit Type: Patient returns to my office for continued preventative foot care services. Complaint: Patient states" my nails have grown long and thick and become painful to walk and wear shoes" . The patient presents for preventative foot care services. No changes to ROS  Podiatric Exam: Vascular: dorsalis pedis and posterior tibial pulses are palpable bilateral. Capillary return is immediate. Temperature gradient is WNL. Skin turgor WNL  Sensorium: Normal Semmes Weinstein monofilament test. Normal tactile sensation bilaterally. Nail Exam: Pt has thick disfigured discolored nails with subungual debris noted bilateral entire nail hallux through fifth toenails Ulcer Exam: There is no evidence of ulcer or pre-ulcerative changes or infection. Orthopedic Exam: Muscle tone and strength are WNL. No limitations in general ROM. No crepitus or effusions noted. Foot type and digits show no abnormalities. Bony prominences are unremarkable. Skin: No Porokeratosis. No infection or ulcers  Diagnosis:  Onychomycosis, , Pain in right toe, pain in left toes  Treatment & Plan Procedures and Treatment: Consent by patient was obtained for treatment procedures. The patient understood the discussion of treatment and procedures well. All questions were answered thoroughly reviewed. Debridement of mycotic and hypertrophic toenails, 1 through 5 bilateral and clearing of subungual debris. No ulceration, no infection noted.  Return Visit-Office Procedure: Patient instructed to return to the office for a follow up visit 3 months for continued evaluation and treatment.    Gardiner Barefoot DPM

## 2015-05-14 ENCOUNTER — Ambulatory Visit: Payer: Medicare Other | Admitting: Podiatry

## 2015-07-02 ENCOUNTER — Ambulatory Visit: Payer: Medicare Other | Admitting: Podiatry

## 2015-07-30 DIAGNOSIS — I10 Essential (primary) hypertension: Secondary | ICD-10-CM | POA: Diagnosis not present

## 2015-07-30 DIAGNOSIS — E756 Lipid storage disorder, unspecified: Secondary | ICD-10-CM | POA: Diagnosis not present

## 2015-08-06 DIAGNOSIS — I10 Essential (primary) hypertension: Secondary | ICD-10-CM | POA: Diagnosis not present

## 2015-08-06 DIAGNOSIS — F419 Anxiety disorder, unspecified: Secondary | ICD-10-CM | POA: Diagnosis not present

## 2015-08-16 ENCOUNTER — Encounter: Payer: Self-pay | Admitting: Podiatry

## 2015-08-16 ENCOUNTER — Ambulatory Visit (INDEPENDENT_AMBULATORY_CARE_PROVIDER_SITE_OTHER): Payer: Medicare Other | Admitting: Podiatry

## 2015-08-16 DIAGNOSIS — B351 Tinea unguium: Secondary | ICD-10-CM | POA: Diagnosis not present

## 2015-08-16 DIAGNOSIS — M79676 Pain in unspecified toe(s): Secondary | ICD-10-CM

## 2015-08-16 NOTE — Progress Notes (Signed)
Patient ID: Toni Gomez, female   DOB: 1923-08-19, 82 y.o.   MRN: [id number] Complaint:  Visit Type: Patient returns to my office for continued preventative foot care services. Complaint: Patient states" my nails have grown long and thick and become painful to walk and wear shoes" . The patient presents for preventative foot care services. No changes to ROS  Podiatric Exam: Vascular: dorsalis pedis and posterior tibial pulses are palpable bilateral. Capillary return is immediate. Temperature gradient is WNL. Skin turgor WNL  Sensorium: Normal Semmes Weinstein monofilament test. Normal tactile sensation bilaterally. Nail Exam: Pt has thick disfigured discolored nails with subungual debris noted bilateral entire nail hallux through fifth toenails Ulcer Exam: There is no evidence of ulcer or pre-ulcerative changes or infection. Orthopedic Exam: Muscle tone and strength are WNL. No limitations in general ROM. No crepitus or effusions noted. Foot type and digits show no abnormalities. Bony prominences are unremarkable. Skin: No Porokeratosis. No infection or ulcers  Diagnosis:  Onychomycosis, , Pain in right toe, pain in left toes  Treatment & Plan Procedures and Treatment: Consent by patient was obtained for treatment procedures. The patient understood the discussion of treatment and procedures well. All questions were answered thoroughly reviewed. Debridement of mycotic and hypertrophic toenails, 1 through 5 bilateral and clearing of subungual debris. No ulceration, no infection noted.  Return Visit-Office Procedure: Patient instructed to return to the office for a follow up visit 3 months for continued evaluation and treatment.    Gardiner Barefoot DPM

## 2015-11-22 ENCOUNTER — Ambulatory Visit: Payer: Medicare Other | Admitting: Podiatry

## 2015-12-05 DIAGNOSIS — R634 Abnormal weight loss: Secondary | ICD-10-CM | POA: Diagnosis not present

## 2015-12-05 DIAGNOSIS — E789 Disorder of lipoprotein metabolism, unspecified: Secondary | ICD-10-CM | POA: Diagnosis not present

## 2015-12-05 DIAGNOSIS — I10 Essential (primary) hypertension: Secondary | ICD-10-CM | POA: Diagnosis not present

## 2015-12-05 DIAGNOSIS — R630 Anorexia: Secondary | ICD-10-CM | POA: Diagnosis not present

## 2016-01-08 DIAGNOSIS — M549 Dorsalgia, unspecified: Secondary | ICD-10-CM | POA: Diagnosis not present

## 2016-01-08 DIAGNOSIS — Z0000 Encounter for general adult medical examination without abnormal findings: Secondary | ICD-10-CM | POA: Diagnosis not present

## 2016-01-08 DIAGNOSIS — M19011 Primary osteoarthritis, right shoulder: Secondary | ICD-10-CM | POA: Diagnosis not present

## 2016-01-08 DIAGNOSIS — I10 Essential (primary) hypertension: Secondary | ICD-10-CM | POA: Diagnosis not present

## 2016-01-08 DIAGNOSIS — M25519 Pain in unspecified shoulder: Secondary | ICD-10-CM | POA: Diagnosis not present

## 2016-02-11 DIAGNOSIS — M79676 Pain in unspecified toe(s): Secondary | ICD-10-CM | POA: Diagnosis not present

## 2016-02-11 DIAGNOSIS — L84 Corns and callosities: Secondary | ICD-10-CM | POA: Diagnosis not present

## 2016-02-11 DIAGNOSIS — B351 Tinea unguium: Secondary | ICD-10-CM | POA: Diagnosis not present

## 2016-03-24 DIAGNOSIS — M1990 Unspecified osteoarthritis, unspecified site: Secondary | ICD-10-CM | POA: Diagnosis not present

## 2016-03-24 DIAGNOSIS — I10 Essential (primary) hypertension: Secondary | ICD-10-CM | POA: Diagnosis not present

## 2016-03-24 DIAGNOSIS — R634 Abnormal weight loss: Secondary | ICD-10-CM | POA: Diagnosis not present

## 2016-07-02 DIAGNOSIS — I10 Essential (primary) hypertension: Secondary | ICD-10-CM | POA: Diagnosis not present

## 2016-07-02 DIAGNOSIS — M1990 Unspecified osteoarthritis, unspecified site: Secondary | ICD-10-CM | POA: Diagnosis not present

## 2016-07-02 DIAGNOSIS — F419 Anxiety disorder, unspecified: Secondary | ICD-10-CM | POA: Diagnosis not present

## 2016-09-08 DIAGNOSIS — M79676 Pain in unspecified toe(s): Secondary | ICD-10-CM | POA: Diagnosis not present

## 2016-09-08 DIAGNOSIS — B351 Tinea unguium: Secondary | ICD-10-CM | POA: Diagnosis not present

## 2016-09-29 DIAGNOSIS — M542 Cervicalgia: Secondary | ICD-10-CM | POA: Diagnosis not present

## 2016-09-29 DIAGNOSIS — F419 Anxiety disorder, unspecified: Secondary | ICD-10-CM | POA: Diagnosis not present

## 2016-09-29 DIAGNOSIS — I10 Essential (primary) hypertension: Secondary | ICD-10-CM | POA: Diagnosis not present

## 2016-11-03 DIAGNOSIS — F419 Anxiety disorder, unspecified: Secondary | ICD-10-CM | POA: Diagnosis not present

## 2016-11-03 DIAGNOSIS — I10 Essential (primary) hypertension: Secondary | ICD-10-CM | POA: Diagnosis not present

## 2016-11-03 DIAGNOSIS — G4700 Insomnia, unspecified: Secondary | ICD-10-CM | POA: Diagnosis not present

## 2016-12-08 DIAGNOSIS — M79676 Pain in unspecified toe(s): Secondary | ICD-10-CM | POA: Diagnosis not present

## 2016-12-08 DIAGNOSIS — B351 Tinea unguium: Secondary | ICD-10-CM | POA: Diagnosis not present

## 2017-03-02 DIAGNOSIS — M542 Cervicalgia: Secondary | ICD-10-CM | POA: Diagnosis not present

## 2017-03-02 DIAGNOSIS — F419 Anxiety disorder, unspecified: Secondary | ICD-10-CM | POA: Diagnosis not present

## 2017-03-02 DIAGNOSIS — I10 Essential (primary) hypertension: Secondary | ICD-10-CM | POA: Diagnosis not present

## 2017-03-02 DIAGNOSIS — M159 Polyosteoarthritis, unspecified: Secondary | ICD-10-CM | POA: Diagnosis not present

## 2017-03-16 DIAGNOSIS — M79676 Pain in unspecified toe(s): Secondary | ICD-10-CM | POA: Diagnosis not present

## 2017-03-16 DIAGNOSIS — B351 Tinea unguium: Secondary | ICD-10-CM | POA: Diagnosis not present

## 2017-06-15 DIAGNOSIS — M79676 Pain in unspecified toe(s): Secondary | ICD-10-CM | POA: Diagnosis not present

## 2017-06-15 DIAGNOSIS — B351 Tinea unguium: Secondary | ICD-10-CM | POA: Diagnosis not present

## 2017-07-21 DIAGNOSIS — Z9841 Cataract extraction status, right eye: Secondary | ICD-10-CM | POA: Diagnosis not present

## 2017-07-21 DIAGNOSIS — Z961 Presence of intraocular lens: Secondary | ICD-10-CM | POA: Diagnosis not present

## 2017-07-21 DIAGNOSIS — H53031 Strabismic amblyopia, right eye: Secondary | ICD-10-CM | POA: Diagnosis not present

## 2017-07-21 DIAGNOSIS — Z9842 Cataract extraction status, left eye: Secondary | ICD-10-CM | POA: Diagnosis not present

## 2017-08-17 DIAGNOSIS — E789 Disorder of lipoprotein metabolism, unspecified: Secondary | ICD-10-CM | POA: Diagnosis not present

## 2017-08-17 DIAGNOSIS — I10 Essential (primary) hypertension: Secondary | ICD-10-CM | POA: Diagnosis not present

## 2017-08-24 DIAGNOSIS — H6123 Impacted cerumen, bilateral: Secondary | ICD-10-CM | POA: Diagnosis not present

## 2017-08-24 DIAGNOSIS — E7800 Pure hypercholesterolemia, unspecified: Secondary | ICD-10-CM | POA: Diagnosis not present

## 2017-08-24 DIAGNOSIS — I10 Essential (primary) hypertension: Secondary | ICD-10-CM | POA: Diagnosis not present

## 2017-08-24 DIAGNOSIS — N183 Chronic kidney disease, stage 3 (moderate): Secondary | ICD-10-CM | POA: Diagnosis not present

## 2017-08-24 DIAGNOSIS — Z0000 Encounter for general adult medical examination without abnormal findings: Secondary | ICD-10-CM | POA: Diagnosis not present

## 2017-08-24 DIAGNOSIS — M542 Cervicalgia: Secondary | ICD-10-CM | POA: Diagnosis not present

## 2017-09-21 DIAGNOSIS — M79676 Pain in unspecified toe(s): Secondary | ICD-10-CM | POA: Diagnosis not present

## 2017-09-21 DIAGNOSIS — B351 Tinea unguium: Secondary | ICD-10-CM | POA: Diagnosis not present

## 2017-12-07 DIAGNOSIS — M79676 Pain in unspecified toe(s): Secondary | ICD-10-CM | POA: Diagnosis not present

## 2017-12-07 DIAGNOSIS — B351 Tinea unguium: Secondary | ICD-10-CM | POA: Diagnosis not present

## 2018-02-22 DIAGNOSIS — M79676 Pain in unspecified toe(s): Secondary | ICD-10-CM | POA: Diagnosis not present

## 2018-02-22 DIAGNOSIS — B351 Tinea unguium: Secondary | ICD-10-CM | POA: Diagnosis not present

## 2018-03-26 ENCOUNTER — Inpatient Hospital Stay (HOSPITAL_COMMUNITY)
Admission: EM | Admit: 2018-03-26 | Discharge: 2018-03-30 | DRG: 065 | Disposition: A | Discharge status: Skilled Nursing Facility | Payer: Medicare HMO | Attending: Family Medicine | Admitting: Family Medicine

## 2018-03-26 ENCOUNTER — Encounter (HOSPITAL_COMMUNITY): Payer: Self-pay | Admitting: Emergency Medicine

## 2018-03-26 ENCOUNTER — Emergency Department (HOSPITAL_COMMUNITY): Payer: Medicare HMO

## 2018-03-26 ENCOUNTER — Other Ambulatory Visit: Payer: Self-pay

## 2018-03-26 DIAGNOSIS — R748 Abnormal levels of other serum enzymes: Secondary | ICD-10-CM | POA: Diagnosis not present

## 2018-03-26 DIAGNOSIS — Z79899 Other long term (current) drug therapy: Secondary | ICD-10-CM

## 2018-03-26 DIAGNOSIS — I4510 Unspecified right bundle-branch block: Secondary | ICD-10-CM | POA: Diagnosis present

## 2018-03-26 DIAGNOSIS — R778 Other specified abnormalities of plasma proteins: Secondary | ICD-10-CM

## 2018-03-26 DIAGNOSIS — N179 Acute kidney failure, unspecified: Secondary | ICD-10-CM

## 2018-03-26 DIAGNOSIS — I10 Essential (primary) hypertension: Secondary | ICD-10-CM | POA: Diagnosis not present

## 2018-03-26 DIAGNOSIS — R531 Weakness: Secondary | ICD-10-CM | POA: Diagnosis not present

## 2018-03-26 DIAGNOSIS — F419 Anxiety disorder, unspecified: Secondary | ICD-10-CM | POA: Diagnosis not present

## 2018-03-26 DIAGNOSIS — Z8673 Personal history of transient ischemic attack (TIA), and cerebral infarction without residual deficits: Secondary | ICD-10-CM

## 2018-03-26 DIAGNOSIS — G9340 Encephalopathy, unspecified: Secondary | ICD-10-CM | POA: Diagnosis not present

## 2018-03-26 DIAGNOSIS — R0902 Hypoxemia: Secondary | ICD-10-CM | POA: Diagnosis not present

## 2018-03-26 DIAGNOSIS — N189 Chronic kidney disease, unspecified: Secondary | ICD-10-CM

## 2018-03-26 DIAGNOSIS — N183 Chronic kidney disease, stage 3 unspecified: Secondary | ICD-10-CM

## 2018-03-26 DIAGNOSIS — R55 Syncope and collapse: Secondary | ICD-10-CM

## 2018-03-26 DIAGNOSIS — Z7982 Long term (current) use of aspirin: Secondary | ICD-10-CM | POA: Diagnosis not present

## 2018-03-26 DIAGNOSIS — W19XXXA Unspecified fall, initial encounter: Secondary | ICD-10-CM

## 2018-03-26 DIAGNOSIS — I5030 Unspecified diastolic (congestive) heart failure: Secondary | ICD-10-CM | POA: Diagnosis not present

## 2018-03-26 DIAGNOSIS — R29706 NIHSS score 6: Secondary | ICD-10-CM | POA: Diagnosis present

## 2018-03-26 DIAGNOSIS — I639 Cerebral infarction, unspecified: Secondary | ICD-10-CM | POA: Diagnosis not present

## 2018-03-26 DIAGNOSIS — R2689 Other abnormalities of gait and mobility: Secondary | ICD-10-CM | POA: Diagnosis not present

## 2018-03-26 DIAGNOSIS — R600 Localized edema: Secondary | ICD-10-CM | POA: Diagnosis not present

## 2018-03-26 DIAGNOSIS — M6281 Muscle weakness (generalized): Secondary | ICD-10-CM | POA: Diagnosis not present

## 2018-03-26 DIAGNOSIS — I6389 Other cerebral infarction: Secondary | ICD-10-CM | POA: Diagnosis not present

## 2018-03-26 DIAGNOSIS — R4182 Altered mental status, unspecified: Secondary | ICD-10-CM | POA: Diagnosis not present

## 2018-03-26 DIAGNOSIS — R262 Difficulty in walking, not elsewhere classified: Secondary | ICD-10-CM | POA: Diagnosis not present

## 2018-03-26 DIAGNOSIS — M6282 Rhabdomyolysis: Secondary | ICD-10-CM | POA: Diagnosis present

## 2018-03-26 DIAGNOSIS — E785 Hyperlipidemia, unspecified: Secondary | ICD-10-CM | POA: Diagnosis present

## 2018-03-26 DIAGNOSIS — R7989 Other specified abnormal findings of blood chemistry: Secondary | ICD-10-CM

## 2018-03-26 DIAGNOSIS — I6523 Occlusion and stenosis of bilateral carotid arteries: Secondary | ICD-10-CM | POA: Diagnosis not present

## 2018-03-26 DIAGNOSIS — M79606 Pain in leg, unspecified: Secondary | ICD-10-CM

## 2018-03-26 DIAGNOSIS — S79911A Unspecified injury of right hip, initial encounter: Secondary | ICD-10-CM | POA: Diagnosis not present

## 2018-03-26 DIAGNOSIS — R9431 Abnormal electrocardiogram [ECG] [EKG]: Secondary | ICD-10-CM | POA: Diagnosis present

## 2018-03-26 DIAGNOSIS — R4020 Unspecified coma: Secondary | ICD-10-CM | POA: Diagnosis not present

## 2018-03-26 DIAGNOSIS — I454 Nonspecific intraventricular block: Secondary | ICD-10-CM | POA: Diagnosis not present

## 2018-03-26 DIAGNOSIS — I129 Hypertensive chronic kidney disease with stage 1 through stage 4 chronic kidney disease, or unspecified chronic kidney disease: Secondary | ICD-10-CM | POA: Diagnosis present

## 2018-03-26 DIAGNOSIS — S79912A Unspecified injury of left hip, initial encounter: Secondary | ICD-10-CM | POA: Diagnosis not present

## 2018-03-26 HISTORY — DX: Essential (primary) hypertension: I10

## 2018-03-26 HISTORY — DX: Other specified abnormal findings of blood chemistry: R79.89

## 2018-03-26 HISTORY — DX: Other specified abnormalities of plasma proteins: R77.8

## 2018-03-26 LAB — HEPATIC FUNCTION PANEL
ALT: 24 U/L (ref 0–44)
AST: 65 U/L — ABNORMAL HIGH (ref 15–41)
Albumin: 3.6 g/dL (ref 3.5–5.0)
Alkaline Phosphatase: 68 U/L (ref 38–126)
BILIRUBIN DIRECT: 0.3 mg/dL — AB (ref 0.0–0.2)
BILIRUBIN INDIRECT: 0.9 mg/dL (ref 0.3–0.9)
TOTAL PROTEIN: 6.6 g/dL (ref 6.5–8.1)
Total Bilirubin: 1.2 mg/dL (ref 0.3–1.2)

## 2018-03-26 LAB — BASIC METABOLIC PANEL
Anion gap: 9 (ref 5–15)
BUN: 23 mg/dL (ref 8–23)
CHLORIDE: 108 mmol/L (ref 98–111)
CO2: 21 mmol/L — ABNORMAL LOW (ref 22–32)
Calcium: 9.8 mg/dL (ref 8.9–10.3)
Creatinine, Ser: 1.33 mg/dL — ABNORMAL HIGH (ref 0.44–1.00)
GFR calc non Af Amer: 34 mL/min — ABNORMAL LOW (ref >60)
GFR, EST AFRICAN AMERICAN: 39 mL/min — AB (ref >60)
Glucose, Bld: 106 mg/dL — ABNORMAL HIGH (ref 70–99)
POTASSIUM: 3.7 mmol/L (ref 3.5–5.1)
SODIUM: 138 mmol/L (ref 135–145)

## 2018-03-26 LAB — TROPONIN I: Troponin I: 0.06 ng/mL (ref <0.03)

## 2018-03-26 LAB — CBC
HEMATOCRIT: 42.7 % (ref 36.0–46.0)
HEMOGLOBIN: 13.4 g/dL (ref 12.0–15.0)
MCH: 26.3 pg (ref 26.0–34.0)
MCHC: 31.4 g/dL (ref 30.0–36.0)
MCV: 83.7 fL (ref 80.0–100.0)
NRBC: 0 % (ref 0.0–0.2)
Platelets: 293 10*3/uL (ref 150–400)
RBC: 5.1 MIL/uL (ref 3.87–5.11)
RDW: 15.4 % (ref 11.5–15.5)
WBC: 9.2 10*3/uL (ref 4.0–10.5)

## 2018-03-26 LAB — BRAIN NATRIURETIC PEPTIDE: B NATRIURETIC PEPTIDE 5: 455 pg/mL — AB (ref 0.0–100.0)

## 2018-03-26 LAB — CBG MONITORING, ED: Glucose-Capillary: 95 mg/dL (ref 70–99)

## 2018-03-26 LAB — CK: CK TOTAL: 3077 U/L — AB (ref 38–234)

## 2018-03-26 MED ORDER — LACTATED RINGERS IV BOLUS
1000.0000 mL | Freq: Once | INTRAVENOUS | Status: AC
  Administered 2018-03-26: 1000 mL via INTRAVENOUS

## 2018-03-26 MED ORDER — SODIUM CHLORIDE 0.9 % IV SOLN
INTRAVENOUS | Status: DC
  Administered 2018-03-26: 21:00:00 via INTRAVENOUS

## 2018-03-26 NOTE — ED Notes (Signed)
CRITICAL VALUE ALERT  Critical Value:  Trop. 0.06  Date & Time Notied:  03/26/18 2114  Provider Notified: Dr. Rogene Houston  Orders Received/Actions taken: See Chart

## 2018-03-26 NOTE — ED Triage Notes (Addendum)
Pt comes from home via EMS. Pt was sitting in her chair at home with agonal respirations. Fire dept was bagging pt upon EMS arrival. Pts initial O2 saturation was 56%. When pt received O2 via nasal cannula pt became alert and responsive. Pt does live alone. Pt states that she did have a fall on Sunday.

## 2018-03-26 NOTE — ED Provider Notes (Addendum)
[name] EMERGENCY DEPARTMENT Provider Note   CSN: [id number] Arrival date & time: 03/26/18  1952     History   Chief Complaint Chief Complaint  Patient presents with  . Weakness    HPI Toni Gomez is a 84 y.o. female.  Patient brought in by EMS.  Patient lives at home.  Patient reportedly was sitting in her chair at home with agonal respirations at respirations.  Fire department was bagging patient upon EMS arrival.  Patient's initial O2 saturation was 56%.  When patient received oxygen nasal cannula patient became alert and responsive.  Patient does live alone.  Family members think that she fell out of her chair at some point in time on the floor.  The patient seems to be fairly confused.  Here patient's oxygen saturations are fine and patient appears to be in no acute distress.     Past Medical History:  Diagnosis Date  . Hypertension     Patient Active Problem List   Diagnosis Date Noted  . Altered mental status 03/26/2018    History reviewed. No pertinent surgical history.   OB History   None      Home Medications    Prior to Admission medications   Medication Sig Start Date End Date Taking? Authorizing Provider  amLODipine (NORVASC) 2.5 MG tablet Take 2.5 mg by mouth daily.    Yes [provider]  aspirin 81 MG tablet Take 81 mg by mouth daily.   Yes [provider]  LORazepam (ATIVAN) 0.5 MG tablet Take 0.5 mg by mouth every 4 (four) hours as needed for anxiety.   Yes [provider]  torsemide (DEMADEX) 20 MG tablet Take 10-20 mg by mouth daily as needed (for fluid).  04/25/13  Yes [provider]    Family History No family history on file.  Social History Social History   Tobacco Use  . Smoking status: Never Smoker  Substance Use Topics  . Alcohol use: No  . Drug use: No     Allergies   Patient has no known allergies.   Review of Systems Review of Systems  Unable to perform ROS: Mental status  change     Physical Exam Updated Vital Signs BP (!) 170/83   Pulse 60   Temp (!) 97.4 F (36.3 C) (Oral)   Resp 14   SpO2 100%   Physical Exam  Constitutional: She appears well-developed and well-nourished. No distress.  HENT:  Head: Normocephalic and atraumatic.  Mucous membranes dry  Eyes: Pupils are equal, round, and reactive to light. Conjunctivae and EOM are normal.  Neck: Neck supple.  Cardiovascular: Normal rate, regular rhythm and normal heart sounds.  Pulmonary/Chest: Effort normal and breath sounds normal.  Abdominal: Soft. Bowel sounds are normal. There is no tenderness.  Musculoskeletal: Normal range of motion. She exhibits deformity. She exhibits no edema.  Left leg appears to be shorter than right.  But with good range of motion no hip pain.  On either side.  Neurological: She is alert.  But seems to be very confused she is awake some difficulty following commands.  Moving all 4 extremities on her own.  No obvious focal deficit.  Is verbalizing.  Skin: Skin is warm. Capillary refill takes less than 2 seconds. No rash noted.  Nursing note and vitals reviewed.    ED Treatments / Results  Labs (all labs ordered are listed, but only abnormal results are displayed) Labs Reviewed  BASIC METABOLIC PANEL - Abnormal; Notable  for the following components:      Result Value   CO2 21 (*)    Glucose, Bld 106 (*)    Creatinine, Ser 1.33 (*)    GFR calc non Af Amer 34 (*)    GFR calc Af Amer 39 (*)    All other components within normal limits  HEPATIC FUNCTION PANEL - Abnormal; Notable for the following components:   AST 65 (*)    Bilirubin, Direct 0.3 (*)    All other components within normal limits  CK - Abnormal; Notable for the following components:   Total CK 3,077 (*)    All other components within normal limits  TROPONIN I - Abnormal; Notable for the following components:   Troponin I 0.06 (*)    All other components within normal limits  CBC  URINALYSIS,  ROUTINE W REFLEX MICROSCOPIC  CBG MONITORING, ED    EKG EKG Interpretation  Date/Time:  Saturday March 26 2018 20:01:36 EST Ventricular Rate:  69 PR Interval:    QRS Duration: 156 QT Interval:  463 QTC Calculation: 497 R Axis:   -35 Text Interpretation:  Sinus rhythm Prolonged PR interval Right bundle branch block Abnormal inferior Q waves Confirmed by Fredia Sorrow [phone]) on 03/26/2018 8:09:35 PM   Radiology Dg Chest 2 View  Result Date: 03/26/2018 CLINICAL DATA:  Again all respirations., altered mental status. EXAM: CHEST - 2 VIEW COMPARISON:  Chest x-ray dated 01/08/2016. FINDINGS: Given the supine patient positioning, lungs are clear. No pleural effusion or pneumothorax seen. Heart size and mediastinal contours are within normal limits. Osseous structures about the chest are unremarkable. IMPRESSION: No active cardiopulmonary disease. Electronically Signed   By: Franki Cabot M.D.   On: 03/26/2018 22:02   Ct Head Wo Contrast  Result Date: 03/26/2018 CLINICAL DATA:  Initial evaluation for acute altered mental status, unexplained. EXAM: CT HEAD WITHOUT CONTRAST TECHNIQUE: Contiguous axial images were obtained from the base of the skull through the vertex without intravenous contrast. COMPARISON:  None. FINDINGS: Brain: Generalized age related cerebral atrophy with mild chronic small vessel ischemic disease. No acute intracranial hemorrhage. No acute large vessel territory infarct. No mass lesion, midline shift or mass effect. No hydrocephalus. No extra-axial fluid collection. Vascular: No hyperdense vessel. Calcified atherosclerosis at the skull base. Skull: Scalp soft tissues and calvarium within normal limits. Sinuses/Orbits: Globes and orbital soft tissues demonstrate no acute finding. Visualized paranasal sinuses and mastoid air cells are clear. Other: None. IMPRESSION: 1. No acute intracranial abnormality. 2. Generalized age-related cerebral atrophy with mild chronic small vessel  ischemic disease. Electronically Signed   By: Jeannine Boga M.D.   On: 03/26/2018 21:56   Dg Hips Bilat W Or Wo Pelvis 3-4 Views  Result Date: 03/26/2018 CLINICAL DATA:  Fall, altered mental status. EXAM: DG HIP (WITH OR WITHOUT PELVIS) 3-4V BILAT COMPARISON:  None. FINDINGS: Osseous alignment is normal. No fracture line or displaced fracture fragment identified. No significant degenerative change at either hip joint. Intramedullary rod within the LEFT femur is incompletely imaged. Soft tissues about the pelvis and hips are unremarkable. IMPRESSION: No acute findings.  No osseous fracture or dislocation seen. Electronically Signed   By: Franki Cabot M.D.   On: 03/26/2018 22:04    Procedures Procedures (including critical care time)  Medications Ordered in ED Medications  0.9 %  sodium chloride infusion ( Intravenous New Bag/Given 03/26/18 2100)     Initial Impression / Assessment and Plan / ED Course  I have reviewed the triage  vital signs and the nursing notes.  Pertinent labs & imaging results that were available during my care of the patient were reviewed by me and considered in my medical decision making (see chart for details).    Patient will be admitted by the hospitalist service.  Patient here with improvement in her mental status seems to be more oriented.  Work-up head CT chest x-ray that any acute findings.  Urinalysis still pending.  Patient had mild elevation in her troponin but not denied any chest pain.  And patient also had some elevation in her CK.  Patient will receive IV fluids overnight and serial cardiac enzymes.  Urinalysis pending still.  Clinically had some concerns for possible hip fracture even though she had good range of motion.  X-rays of pelvis and both hips without any acute abnormalities.  Final Clinical Impressions(s) / ED Diagnoses   Final diagnoses:  Fall, initial encounter  Altered mental status, unspecified altered mental status type    Elevated troponin    ED Discharge Orders    None       Fredia Sorrow, MD 03/26/18 2302    Fredia Sorrow, MD 03/26/18 2303

## 2018-03-26 NOTE — H&P (Addendum)
History and Physical    Toni Gomez:[id number] DOB: [birth year] DOA: 03/26/2018  PCP: Anda Kraft, MD   Patient coming from: Home.  I have personally briefly reviewed patient's old medical records in [place]  Chief Complaint: AMS.  HPI: Toni Gomez is a 84 y.o. female with medical history significant of hypertension who was brought to the emergency department via EMS after being found sitting in her chair at home with agonal respirations.  EMS found her to be 56% on room air.  They gave her rescue breathing with AMBU bag and supplemental oxygen, she became responsive.  The patient is unable to remember what happened, but was able to response to all my questions properly.  She denies fever, headache, sore throat, wheezing, chest pain, palpitations, dizziness, diaphoresis, PND orthopnea.  She occasionally gets lower extremity edema.  She denies nausea, emesis, diarrhea, constipation, melena or hematochezia.  No dysuria or hematuria.  Denies polyuria, polydipsia or polyphagia.  She denies skin pruritus.  She stated that her appetite and sleep are good.  ED Course: Initial vital signs were temperature 97.4 F, pulse 96, respirations 15, blood pressure 163/81 mmHg and O2 sat 96% on room air.  No medications were given in the emergency department  Urinalysis still not collected.  AMS.  White counts 9.2, hemoglobin 13.4 g/dL and platelets 293.  Sodium 138, potassium 3.7, chloride 108 and CO2 21 mmol/L.  Glucose 106, BUN 23, creatinine 1.33 and calcium 9.8 mg/dL.  Troponin was 0.06 ng/mL.  BNP 455.0 pg/mL.  LFTs showed a mildly elevated AST of 65 U/L, but all other values are within normal limits.  CT head without contrast did not show any acute intracranial pathology.  Review of Systems: As per HPI otherwise 10 point review of systems negative.  Past Medical History:  Diagnosis Date  . Hypertension     History reviewed. No pertinent surgical history.   reports that she has  never smoked. She does not have any smokeless tobacco history on file. She reports that she does not drink alcohol or use drugs.  No Known Allergies  Family History  Problem Relation Age of Onset  . Hypertension Other    Prior to Admission medications   Medication Sig Start Date End Date Taking? Authorizing Provider  amLODipine (NORVASC) 2.5 MG tablet Take 2.5 mg by mouth daily.    Yes [provider]  aspirin 81 MG tablet Take 81 mg by mouth daily.   Yes [provider]  LORazepam (ATIVAN) 0.5 MG tablet Take 0.5 mg by mouth every 4 (four) hours as needed for anxiety.   Yes [provider]  torsemide (DEMADEX) 20 MG tablet Take 10-20 mg by mouth daily as needed (for fluid).  04/25/13  Yes [provider]    Physical Exam: Vitals:   03/26/18 2324 03/26/18 2330 03/26/18 2341 03/26/18 2348  BP: (!) 127/109 (!) 151/139 (!) 145/123 (!) 172/70  Pulse: 70 65 73   Resp: 14 15 15    Temp:      TempSrc:      SpO2: 100% 100% 97%     Constitutional: NAD, calm, comfortable Eyes: PERRL, lids and conjunctivae normal ENMT: Mucous membranes are moist. Posterior pharynx clear of any exudate or lesions. Neck: normal, supple, no masses, no thyromegaly Respiratory: Clear to auscultation bilaterally, no wheezing, no crackles. Normal respiratory effort. No accessory muscle use.  Cardiovascular: Regular rate and rhythm, no murmurs / rubs / gallops. No extremity edema. 2+ pedal pulses.  No carotid bruits.  Abdomen: Soft, no tenderness, no masses palpated. No hepatosplenomegaly. Bowel sounds positive.  Musculoskeletal: no clubbing / cyanosis.  Left lower extremities is shorter than the right one.  The patient states that this is not a new finding.  She denied any left hip, knee or foot tenderness.Good ROM, no contractures. Normal muscle tone.  Skin: Areas of ecchymosis on upper extremities. Neurologic: CN 2-12 grossly intact. Sensation intact, DTR normal. Strength 5/5 in  all 4.  Psychiatric: Normal judgment and insight. Alert and oriented x 4. Normal mood.   Labs on Admission: I have personally reviewed following labs and imaging studies  CBC: Recent Labs  Lab 03/26/18 2016  WBC 9.2  HGB 13.4  HCT 42.7  MCV 83.7  PLT 465   Basic Metabolic Panel: Recent Labs  Lab 03/26/18 2016  NA 138  K 3.7  CL 108  CO2 21*  GLUCOSE 106*  BUN 23  CREATININE 1.33*  CALCIUM 9.8   GFR: CrCl cannot be calculated (Unknown ideal weight.). Liver Function Tests: Recent Labs  Lab 03/26/18 2016  AST 65*  ALT 24  ALKPHOS 68  BILITOT 1.2  PROT 6.6  ALBUMIN 3.6   No results for input(s): LIPASE, AMYLASE in the last 168 hours. No results for input(s): AMMONIA in the last 168 hours. Coagulation Profile: No results for input(s): INR, PROTIME in the last 168 hours. Cardiac Enzymes: Recent Labs  Lab 03/26/18 2016  CKTOTAL 3,077*  TROPONINI 0.06*   BNP (last 3 results) No results for input(s): PROBNP in the last 8760 hours. HbA1C: No results for input(s): HGBA1C in the last 72 hours. CBG: Recent Labs  Lab 03/26/18 2033  GLUCAP 95   Lipid Profile: No results for input(s): CHOL, HDL, LDLCALC, TRIG, CHOLHDL, LDLDIRECT in the last 72 hours. Thyroid Function Tests: No results for input(s): TSH, T4TOTAL, FREET4, T3FREE, THYROIDAB in the last 72 hours. Anemia Panel: No results for input(s): VITAMINB12, FOLATE, FERRITIN, TIBC, IRON, RETICCTPCT in the last 72 hours. Urine analysis: No results found for: COLORURINE, APPEARANCEUR, LABSPEC, [place], GLUCOSEU, [place], [place], KETONESUR, PROTEINUR, UROBILINOGEN, NITRITE, LEUKOCYTESUR  Radiological Exams on Admission: Dg Chest 2 View  Result Date: 03/26/2018 CLINICAL DATA:  Again all respirations., altered mental status. EXAM: CHEST - 2 VIEW COMPARISON:  Chest x-ray dated 01/08/2016. FINDINGS: Given the supine patient positioning, lungs are clear. No pleural effusion or pneumothorax seen. Heart size and  mediastinal contours are within normal limits. Osseous structures about the chest are unremarkable. IMPRESSION: No active cardiopulmonary disease. Electronically Signed   By: Franki Cabot M.D.   On: 03/26/2018 22:02   Ct Head Wo Contrast  Result Date: 03/26/2018 CLINICAL DATA:  Initial evaluation for acute altered mental status, unexplained. EXAM: CT HEAD WITHOUT CONTRAST TECHNIQUE: Contiguous axial images were obtained from the base of the skull through the vertex without intravenous contrast. COMPARISON:  None. FINDINGS: Brain: Generalized age related cerebral atrophy with mild chronic small vessel ischemic disease. No acute intracranial hemorrhage. No acute large vessel territory infarct. No mass lesion, midline shift or mass effect. No hydrocephalus. No extra-axial fluid collection. Vascular: No hyperdense vessel. Calcified atherosclerosis at the skull base. Skull: Scalp soft tissues and calvarium within normal limits. Sinuses/Orbits: Globes and orbital soft tissues demonstrate no acute finding. Visualized paranasal sinuses and mastoid air cells are clear. Other: None. IMPRESSION: 1. No acute intracranial abnormality. 2. Generalized age-related cerebral atrophy with mild chronic small vessel ischemic disease. Electronically Signed   By: Pincus Badder.D.  On: 03/26/2018 21:56   Dg Hips Bilat W Or Wo Pelvis 3-4 Views  Result Date: 03/26/2018 CLINICAL DATA:  Fall, altered mental status. EXAM: DG HIP (WITH OR WITHOUT PELVIS) 3-4V BILAT COMPARISON:  None. FINDINGS: Osseous alignment is normal. No fracture line or displaced fracture fragment identified. No significant degenerative change at either hip joint. Intramedullary rod within the LEFT femur is incompletely imaged. Soft tissues about the pelvis and hips are unremarkable. IMPRESSION: No acute findings.  No osseous fracture or dislocation seen. Electronically Signed   By: Franki Cabot M.D.   On: 03/26/2018 22:04    EKG: Independently  reviewed.  Vent. rate 69 BPM PR interval * ms QRS duration 156 ms QT/QTc 463/497 ms P-R-T axes 76 -35 27 Sinus rhythm Prolonged PR interval Right bundle branch block Abnormal inferior Q waves  Assessment/Plan Principal Problem:   Rhabdomyolysis Likely due to prolonged immobilization while altered. Continue gentle IV fluids. Judicious use of IV diuretics. Monitor intake and output. Monitor renal function/electrolytes. Check echocardiogram for better management.  Active Problems:   Altered mental status Resolved. The patient was awake, alert, oriented x4.    Elevated troponin Likely secondary to rhabdomyolysis.    Hypertension Continue amlodipine 2.5 mg p.o. daily. Continue aspirin 81 mg p.o. daily. Continue lorazepam 0.5 mg every 4 hours as needed. Hold torsemide.    Abnormal EKG Check echocardiogram.    DVT prophylaxis: Heparin SQ. Code Status: Full code. Family Communication: None at bedside. Disposition Plan: Admit for IV hydration for rhabdomyolysis treatment. Consults called: Admission status: Inpatient/telemetry.   Reubin Milan MD [facility] Pager [phone].  If 7PM-7AM, please contact night-coverage [email or web address] Password [email or web address]  03/26/2018, 11:52 PM

## 2018-03-27 ENCOUNTER — Encounter (HOSPITAL_COMMUNITY): Payer: Self-pay

## 2018-03-27 ENCOUNTER — Inpatient Hospital Stay (HOSPITAL_COMMUNITY): Payer: Medicare HMO

## 2018-03-27 DIAGNOSIS — M6282 Rhabdomyolysis: Secondary | ICD-10-CM

## 2018-03-27 DIAGNOSIS — I10 Essential (primary) hypertension: Secondary | ICD-10-CM

## 2018-03-27 DIAGNOSIS — R7989 Other specified abnormal findings of blood chemistry: Secondary | ICD-10-CM

## 2018-03-27 DIAGNOSIS — R9431 Abnormal electrocardiogram [ECG] [EKG]: Secondary | ICD-10-CM

## 2018-03-27 DIAGNOSIS — R55 Syncope and collapse: Secondary | ICD-10-CM

## 2018-03-27 DIAGNOSIS — I5030 Unspecified diastolic (congestive) heart failure: Secondary | ICD-10-CM

## 2018-03-27 DIAGNOSIS — G9340 Encephalopathy, unspecified: Secondary | ICD-10-CM

## 2018-03-27 LAB — URINALYSIS, ROUTINE W REFLEX MICROSCOPIC
Bacteria, UA: NONE SEEN
Bilirubin Urine: NEGATIVE
Glucose, UA: NEGATIVE mg/dL
Ketones, ur: 5 mg/dL — AB
LEUKOCYTES UA: NEGATIVE
NITRITE: NEGATIVE
PROTEIN: 100 mg/dL — AB
SPECIFIC GRAVITY, URINE: 1.012 (ref 1.005–1.030)
pH: 5 (ref 5.0–8.0)

## 2018-03-27 LAB — CK: Total CK: 1856 U/L — ABNORMAL HIGH (ref 38–234)

## 2018-03-27 LAB — TROPONIN I: Troponin I: 0.06 ng/mL (ref <0.03)

## 2018-03-27 LAB — ECHOCARDIOGRAM COMPLETE
HEIGHTINCHES: 61 in
WEIGHTICAEL: 2412.71 [oz_av]

## 2018-03-27 MED ORDER — ASPIRIN EC 81 MG PO TBEC
81.0000 mg | DELAYED_RELEASE_TABLET | Freq: Every day | ORAL | Status: DC
  Administered 2018-03-27 – 2018-03-30 (×4): 81 mg via ORAL
  Filled 2018-03-27 (×4): qty 1

## 2018-03-27 MED ORDER — TORSEMIDE 20 MG PO TABS: 10.0000 mg | ORAL_TABLET | Freq: Every day | ORAL | Status: DC | PRN

## 2018-03-27 MED ORDER — ONDANSETRON HCL 4 MG/2ML IJ SOLN: 4.0000 mg | Freq: Four times a day (QID) | INTRAMUSCULAR | Status: DC | PRN

## 2018-03-27 MED ORDER — ONDANSETRON HCL 4 MG PO TABS: 4.0000 mg | ORAL_TABLET | Freq: Four times a day (QID) | ORAL | Status: DC | PRN

## 2018-03-27 MED ORDER — POTASSIUM CHLORIDE IN NACL 20-0.9 MEQ/L-% IV SOLN
INTRAVENOUS | Status: AC
  Administered 2018-03-27: 16:00:00 via INTRAVENOUS

## 2018-03-27 MED ORDER — LACTATED RINGERS IV SOLN
INTRAVENOUS | Status: DC
  Administered 2018-03-27: 06:00:00 via INTRAVENOUS

## 2018-03-27 MED ORDER — ACETAMINOPHEN 325 MG PO TABS
650.0000 mg | ORAL_TABLET | Freq: Four times a day (QID) | ORAL | Status: DC | PRN
  Administered 2018-03-28 – 2018-03-29 (×2): 650 mg via ORAL
  Filled 2018-03-27 (×2): qty 2

## 2018-03-27 MED ORDER — AMLODIPINE BESYLATE 5 MG PO TABS
2.5000 mg | ORAL_TABLET | Freq: Every day | ORAL | Status: DC
  Administered 2018-03-27: 2.5 mg via ORAL
  Filled 2018-03-27 (×2): qty 1

## 2018-03-27 MED ORDER — HEPARIN SODIUM (PORCINE) 5000 UNIT/ML IJ SOLN
5000.0000 [IU] | Freq: Three times a day (TID) | INTRAMUSCULAR | Status: DC
  Administered 2018-03-27 – 2018-03-30 (×10): 5000 [IU] via SUBCUTANEOUS
  Filled 2018-03-27 (×11): qty 1

## 2018-03-27 MED ORDER — ACETAMINOPHEN 650 MG RE SUPP: 650.0000 mg | Freq: Four times a day (QID) | RECTAL | Status: DC | PRN

## 2018-03-27 MED ORDER — HYDRALAZINE HCL 20 MG/ML IJ SOLN: 10.0000 mg | INTRAMUSCULAR | Status: DC | PRN

## 2018-03-27 MED ORDER — FUROSEMIDE 10 MG/ML IJ SOLN
20.0000 mg | Freq: Once | INTRAMUSCULAR | Status: AC
  Administered 2018-03-27: 20 mg via INTRAVENOUS
  Filled 2018-03-27: qty 2

## 2018-03-27 MED ORDER — LORAZEPAM 0.5 MG PO TABS: 0.5000 mg | ORAL_TABLET | ORAL | Status: DC | PRN

## 2018-03-27 NOTE — Progress Notes (Signed)
PROGRESS NOTE  Toni Gomez QQP:[id number] DOB: 19-May-1923 DOA: 03/26/2018 PCP: Anda Kraft, MD  Brief History:  84 year old female with history of hypertension and anxiety presenting with a syncopal episode.  The patient was sitting next to her nephew when she slumped over her chair and became unconscious in the afternoon of 03/26/2018.  The patient denied any aura, dizziness, chest discomfort, shortness breath, or palpitations.  EMS was activated, and the patient was noted to have oxygen saturation in the 50s.  The patient was subsequently Ambu bag and subsequently woke up.  During the right in the ambulance, the patient was awake and conversive, but was confused.  The patient's mental status continued to improve such that by the time she had a work-up in the emergency department, she was awake and conversant.  There was no tonic-clonic activity.  There was no tongue biting or bowel or bladder incontinence.  The patient's nephew states that the patient has been in her usual state of health prior to this episode.  There is been no new changes in her medications.  She denies taking any over-the-counter medicines except for occasional acetaminophen.  There is not been any headaches, nausea, vomiting, diarrhea, abdominal pain, dysuria, hematuria.  In the emergency department, the patient was afebrile hemodynamically stable.  BMP and CBC were essentially unremarkable.  CT the brain was negative.  Chest x-ray was negative.  EKG shows sinus rhythm with right bundle branch block.  Assessment/Plan: Syncope -Echocardiogram -Orthostatic vital signs -EEG  Acute encephalopathy, type unspecified -MRI brain -Urinalysis negative for pyuria -Serum Z12 -Folic acid -TSH -ammonia  Rhabdomyolysis -CPK peaked 1856 -Continue IV fluids -A.m. CPK  Lower extremity edema and pain -Venous duplex rule out DVT  Essential hypertension -Continue home dose of amlodipine  Elevated troponin -trend is  flat -personally reviewed EKG--sinus, RBBB -no chest pain     Disposition Plan:   Home vs SNF 1-2 days  Family Communication:   Niece/nephew at bedside 11/10  Consultants:  none  Code Status:  FULL  DVT Prophylaxis:  [place] Heparin    Procedures: As Listed in Progress Note Above  Antibiotics: None      Subjective: Patient denies fevers, chills, headache, chest pain, dyspnea, nausea, vomiting, diarrhea, abdominal pain, dysuria, hematuria, hematochezia, and melena.   Objective: Vitals:   03/27/18 0200 03/27/18 0226 03/27/18 0640 03/27/18 1348  BP: (!) 157/55 (!) 163/76 (!) 163/66 (!) 137/54  Pulse: (!) 59 71 72 86  Resp: 14 18 18 19   Temp:  (!) 97.5 F (36.4 C) 98.2 F (36.8 C) 98 F (36.7 C)  TempSrc:  Oral Oral Oral  SpO2: 100% 100% 100% 96%  Weight:  68.4 kg    Height:  5\' 1"  (1.549 m)      Intake/Output Summary (Last 24 hours) at 03/27/2018 1500 Last data filed at 03/27/2018 0608 Gross per 24 hour  Intake 1135.36 ml  Output -  Net 1135.36 ml   Weight change:  Exam:   General:  Pt is alert, follows commands appropriately, not in acute distress  HEENT: No icterus, No thrush, No neck mass, Hydro/AT  Cardiovascular: RRR, S1/S2, no rubs, no gallops  Respiratory: CTA bilaterally, no wheezing, no crackles, no rhonchi  Abdomen: Soft/+BS, non tender, non distended, no guarding  Extremities: 1 + LE edema, No lymphangitis, No petechiae, No rashes, no synovitis   Data Reviewed: I have personally reviewed following labs and imaging studies Basic Metabolic Panel: Recent  Labs  Lab 03/26/18 2016  NA 138  K 3.7  CL 108  CO2 21*  GLUCOSE 106*  BUN 23  CREATININE 1.33*  CALCIUM 9.8   Liver Function Tests: Recent Labs  Lab 03/26/18 2016  AST 65*  ALT 24  ALKPHOS 68  BILITOT 1.2  PROT 6.6  ALBUMIN 3.6   No results for input(s): LIPASE, AMYLASE in the last 168 hours. No results for input(s): AMMONIA in the last 168 hours. Coagulation  Profile: No results for input(s): INR, PROTIME in the last 168 hours. CBC: Recent Labs  Lab 03/26/18 2016  WBC 9.2  HGB 13.4  HCT 42.7  MCV 83.7  PLT 293   Cardiac Enzymes: Recent Labs  Lab 03/26/18 2016 03/27/18 0611  CKTOTAL 3,077* 1,856*  TROPONINI 0.06* 0.06*   BNP: Invalid input(s): POCBNP CBG: Recent Labs  Lab 03/26/18 2033  GLUCAP 95   HbA1C: No results for input(s): HGBA1C in the last 72 hours. Urine analysis:    Component Value Date/Time   COLORURINE YELLOW 03/27/2018 0800   APPEARANCEUR CLEAR 03/27/2018 0800   LABSPEC 1.012 03/27/2018 0800   PHURINE 5.0 03/27/2018 0800   GLUCOSEU NEGATIVE 03/27/2018 0800   HGBUR MODERATE (A) 03/27/2018 0800   BILIRUBINUR NEGATIVE 03/27/2018 0800   KETONESUR 5 (A) 03/27/2018 0800   PROTEINUR 100 (A) 03/27/2018 0800   NITRITE NEGATIVE 03/27/2018 0800   LEUKOCYTESUR NEGATIVE 03/27/2018 0800   Sepsis Labs: @LABRCNTIP (procalcitonin:4,lacticidven:4) )No results found for this or any previous visit (from the past 240 hour(s)).   Scheduled Meds: . amLODipine  2.5 mg Oral Daily  . aspirin EC  81 mg Oral Daily  . heparin  5,000 Units Subcutaneous Q8H   Continuous Infusions: . lactated ringers 75 mL/hr at 03/27/18 7616    Procedures/Studies: Dg Chest 2 View  Result Date: 03/26/2018 CLINICAL DATA:  Again all respirations., altered mental status. EXAM: CHEST - 2 VIEW COMPARISON:  Chest x-ray dated 01/08/2016. FINDINGS: Given the supine patient positioning, lungs are clear. No pleural effusion or pneumothorax seen. Heart size and mediastinal contours are within normal limits. Osseous structures about the chest are unremarkable. IMPRESSION: No active cardiopulmonary disease. Electronically Signed   By: Franki Cabot M.D.   On: 03/26/2018 22:02   Ct Head Wo Contrast  Result Date: 03/26/2018 CLINICAL DATA:  Initial evaluation for acute altered mental status, unexplained. EXAM: CT HEAD WITHOUT CONTRAST TECHNIQUE: Contiguous  axial images were obtained from the base of the skull through the vertex without intravenous contrast. COMPARISON:  None. FINDINGS: Brain: Generalized age related cerebral atrophy with mild chronic small vessel ischemic disease. No acute intracranial hemorrhage. No acute large vessel territory infarct. No mass lesion, midline shift or mass effect. No hydrocephalus. No extra-axial fluid collection. Vascular: No hyperdense vessel. Calcified atherosclerosis at the skull base. Skull: Scalp soft tissues and calvarium within normal limits. Sinuses/Orbits: Globes and orbital soft tissues demonstrate no acute finding. Visualized paranasal sinuses and mastoid air cells are clear. Other: None. IMPRESSION: 1. No acute intracranial abnormality. 2. Generalized age-related cerebral atrophy with mild chronic small vessel ischemic disease. Electronically Signed   By: Jeannine Boga M.D.   On: 03/26/2018 21:56   Dg Hips Bilat W Or Wo Pelvis 3-4 Views  Result Date: 03/26/2018 CLINICAL DATA:  Fall, altered mental status. EXAM: DG HIP (WITH OR WITHOUT PELVIS) 3-4V BILAT COMPARISON:  None. FINDINGS: Osseous alignment is normal. No fracture line or displaced fracture fragment identified. No significant degenerative change at either hip joint. Intramedullary rod within  the LEFT femur is incompletely imaged. Soft tissues about the pelvis and hips are unremarkable. IMPRESSION: No acute findings.  No osseous fracture or dislocation seen. Electronically Signed   By: Franki Cabot M.D.   On: 03/26/2018 22:04    Orson Eva, DO  [facility] Pager [phone]  If 7PM-7AM, please contact night-coverage [email or web address] Password [email or web address] 03/27/2018, 3:00 PM   LOS: 1 day

## 2018-03-27 NOTE — Progress Notes (Signed)
*  PRELIMINARY RESULTS* Echocardiogram 2D Echocardiogram has been performed.  Leavy Cella 03/27/2018, 11:48 AM

## 2018-03-28 ENCOUNTER — Inpatient Hospital Stay (HOSPITAL_COMMUNITY): Payer: Medicare HMO

## 2018-03-28 DIAGNOSIS — N183 Chronic kidney disease, stage 3 unspecified: Secondary | ICD-10-CM

## 2018-03-28 DIAGNOSIS — Z8673 Personal history of transient ischemic attack (TIA), and cerebral infarction without residual deficits: Secondary | ICD-10-CM

## 2018-03-28 DIAGNOSIS — W19XXXA Unspecified fall, initial encounter: Secondary | ICD-10-CM

## 2018-03-28 DIAGNOSIS — R4182 Altered mental status, unspecified: Secondary | ICD-10-CM

## 2018-03-28 DIAGNOSIS — N189 Chronic kidney disease, unspecified: Secondary | ICD-10-CM

## 2018-03-28 DIAGNOSIS — N179 Acute kidney failure, unspecified: Secondary | ICD-10-CM

## 2018-03-28 DIAGNOSIS — I639 Cerebral infarction, unspecified: Secondary | ICD-10-CM

## 2018-03-28 LAB — BASIC METABOLIC PANEL
ANION GAP: 7 (ref 5–15)
BUN: 22 mg/dL (ref 8–23)
CALCIUM: 9.1 mg/dL (ref 8.9–10.3)
CHLORIDE: 111 mmol/L (ref 98–111)
CO2: 21 mmol/L — ABNORMAL LOW (ref 22–32)
CREATININE: 1.54 mg/dL — AB (ref 0.44–1.00)
GFR calc non Af Amer: 28 mL/min — ABNORMAL LOW (ref >60)
GFR, EST AFRICAN AMERICAN: 33 mL/min — AB (ref >60)
Glucose, Bld: 82 mg/dL (ref 70–99)
Potassium: 3.7 mmol/L (ref 3.5–5.1)
SODIUM: 139 mmol/L (ref 135–145)

## 2018-03-28 LAB — LIPID PANEL
Cholesterol: 182 mg/dL (ref 0–200)
HDL: 41 mg/dL (ref >40)
LDL CALC: 118 mg/dL — AB (ref 0–99)
Total CHOL/HDL Ratio: 4.4 RATIO
Triglycerides: 113 mg/dL (ref <150)
VLDL: 23 mg/dL (ref 0–40)

## 2018-03-28 LAB — T4, FREE: Free T4: 0.89 ng/dL (ref 0.82–1.77)

## 2018-03-28 LAB — AMMONIA: AMMONIA: 25 umol/L (ref 9–35)

## 2018-03-28 LAB — HEMOGLOBIN A1C
HEMOGLOBIN A1C: 5.3 % (ref 4.8–5.6)
Mean Plasma Glucose: 105.41 mg/dL

## 2018-03-28 LAB — CK: Total CK: 598 U/L — ABNORMAL HIGH (ref 38–234)

## 2018-03-28 LAB — TSH: TSH: 3.619 u[IU]/mL (ref 0.350–4.500)

## 2018-03-28 LAB — VITAMIN B12: Vitamin B-12: 188 pg/mL (ref 180–914)

## 2018-03-28 LAB — FOLATE: FOLATE: 6 ng/mL (ref >5.9)

## 2018-03-28 MED ORDER — CLOPIDOGREL BISULFATE 75 MG PO TABS
75.0000 mg | ORAL_TABLET | Freq: Every day | ORAL | Status: DC
  Administered 2018-03-28 – 2018-03-30 (×3): 75 mg via ORAL
  Filled 2018-03-28 (×3): qty 1

## 2018-03-28 MED ORDER — CLOPIDOGREL BISULFATE 75 MG PO TABS: 75.0000 mg | ORAL_TABLET | Freq: Every day | ORAL | 0 refills | Status: DC

## 2018-03-28 MED ORDER — POTASSIUM CHLORIDE IN NACL 20-0.9 MEQ/L-% IV SOLN
INTRAVENOUS | Status: AC
  Administered 2018-03-28: 21:00:00 via INTRAVENOUS

## 2018-03-28 MED ORDER — HYDRALAZINE HCL 20 MG/ML IJ SOLN: 10.0000 mg | Freq: Four times a day (QID) | INTRAMUSCULAR | Status: DC | PRN

## 2018-03-28 MED ORDER — ASPIRIN 81 MG PO TBEC: 81.0000 mg | DELAYED_RELEASE_TABLET | Freq: Every day | ORAL | 0 refills | Status: DC

## 2018-03-28 NOTE — Final Consult Note (Addendum)
Cardiology Consultation:   Patient ID: Toni Gomez MRN: [id number]; DOB: [birth year]  Admit date: 03/26/2018 Date of Consult: 03/28/2018  Primary Care Provider: Anda Kraft, MD Primary Cardiologist: Kate Sable, MD  New Primary Electrophysiologist:  None    Patient Profile:   Toni Gomez is a 84 y.o. female with a hx of hypertension   who is being seen today for the evaluation of rhabdomyolysis and unresponsiveness at the request of Dr. Carles Collet.  History of Present Illness:   Toni Gomez is a 84 year old female patient with history of hypertension who came to the emergency room yesterday after she was witnessed slumping in her chair and became unresponsive with agonal respirations.  EMS found her to be at 56% on room air.  They gave her rescue breathing with a bag and supplemental oxygen and she became responsive.  She was given gentle IV fluids.  She was felt to have rhabdomyolysis secondary to prolonged immobilization.  EKG showed first-degree AV block, right bundle branch block.  No EKGs to compare to.  2D echo today showed normal LV function with mild LVH grade 1 DD mild to moderate aortic stenosis with mild AI and mild MR.  CK initially 3077, 1856, now 598 troponin 0.06.  BNP 455.  Creatinine 1.33 but now 1.54.  Minimal to moderate plaque in the carotids right greater than left not hemodynamically significant.  No DVT on lower extremity Dopplers.  MRI with subcentimeter acute infarct in the right cerebellum and punctate subacute infarct in the left frontoparietal white matter.  MRA without acute finding. She is very alert and oriented now. Only complaint is left leg isn't working like it used to. Very active 84 yo that still drives. Denies cardiac history-no chest pain, dyspnea, dizziness,presyncope.   Past Medical History:  Diagnosis Date  . Hypertension     History reviewed. No pertinent surgical history.   Home Medications:  Prior to Admission medications   Medication  Sig Start Date End Date Taking? Authorizing Provider  amLODipine (NORVASC) 2.5 MG tablet Take 2.5 mg by mouth daily.    Yes [provider]  aspirin 81 MG tablet Take 81 mg by mouth daily.   Yes [provider]  LORazepam (ATIVAN) 0.5 MG tablet Take 0.5 mg by mouth every 4 (four) hours as needed for anxiety.   Yes [provider]  torsemide (DEMADEX) 20 MG tablet Take 10-20 mg by mouth daily as needed (for fluid).  04/25/13  Yes [provider]    Inpatient Medications: Scheduled Meds: . aspirin EC  81 mg Oral Daily  . heparin  5,000 Units Subcutaneous Q8H   Continuous Infusions: . 0.9 % NaCl with KCl 20 mEq / L 75 mL/hr at 03/27/18 1536   PRN Meds: acetaminophen **OR** acetaminophen, hydrALAZINE, LORazepam, ondansetron **OR** ondansetron (ZOFRAN) IV  Allergies:   No Known Allergies  Social History:   Social History   Socioeconomic History  . Marital status: Widowed    Spouse name: Not on file  . Number of children: Not on file  . Years of education: Not on file  . Highest education level: Not on file  Occupational History  . Not on file  Social Needs  . Financial resource strain: Patient refused  . Food insecurity:    Worry: Patient refused    Inability: Patient refused  . Transportation needs:    Medical: Patient refused    Non-medical: Patient refused  Tobacco Use  . Smoking status: Never Smoker  Substance and  Sexual Activity  . Alcohol use: No  . Drug use: No  . Sexual activity: Not Currently  Lifestyle  . Physical activity:    Days per week: Patient refused    Minutes per session: Patient refused  . Stress: Patient refused  Relationships  . Social connections:    Talks on phone: Patient refused    Gets together: Patient refused    Attends religious service: Patient refused    Active member of club or organization: Patient refused    Attends meetings of clubs or organizations: Patient refused    Relationship status:  Patient refused  . Intimate partner violence:    Fear of current or ex partner: Patient refused    Emotionally abused: Patient refused    Physically abused: Patient refused    Forced sexual activity: Patient refused  Other Topics Concern  . Not on file  Social History Narrative  . Not on file    Family History:    Family History  Problem Relation Age of Onset  . Hypertension Other      ROS:  Please see the history of present illness.  Review of Systems  Constitution: Negative.  HENT: Negative.   Eyes: Negative.   Cardiovascular: Negative.   Respiratory: Negative.   Hematologic/Lymphatic: Negative.   Musculoskeletal: Positive for arthritis, joint pain and stiffness.  Gastrointestinal: Negative.   Genitourinary: Negative.   Neurological: Negative.     All other ROS reviewed and negative.     Physical Exam/Data:   Vitals:   03/27/18 1348 03/27/18 2157 03/28/18 0616 03/28/18 1000  BP: (!) 137/54 (!) 147/6 (!) 140/57 (!) 168/69  Pulse: 86 67 71 74  Resp: 19 18 18    Temp: 98 F (36.7 C) 98.5 F (36.9 C) 98.2 F (36.8 C) (!) 97.4 F (36.3 C)  TempSrc: Oral Oral Oral Axillary  SpO2: 96% 98% 99% 100%  Weight:      Height:        Intake/Output Summary (Last 24 hours) at 03/28/2018 1220 Last data filed at 03/28/2018 0617 Gross per 24 hour  Intake 1477.29 ml  Output 1400 ml  Net 77.29 ml   Filed Weights   03/27/18 0226  Weight: 68.4 kg   Body mass index is 28.49 kg/m.  General:  Well nourished, well developed, in no acute distress HEENT: normal Lymph: no adenopathy Neck: no JVD Endocrine:  No thryomegaly Vascular: No carotid bruits; FA pulses 2+ bilaterally without bruits  Cardiac:  normal S1, S2; RRR; no murmur  Lungs:  clear to auscultation bilaterally, no wheezing, rhonchi or rales  Abd: soft, nontender, no hepatomegaly  Ext: no edema Musculoskeletal:  No deformities, BUE and BLE strength normal and equal Skin: warm and dry  Neuro:  CNs 2-12  intact, no focal abnormalities noted Psych:  Normal affect   EKG:  The EKG was personally reviewed and demonstrates:   first-degree AV block, right bundle branch block  Telemetry:  Telemetry was personally reviewed and demonstrates:  NSR with PVC's  Relevant CV Studies: 2D echo 11/11/2019Study Conclusions   - Left ventricle: The cavity size was normal. Wall thickness was   increased in a pattern of mild LVH. Systolic function was   vigorous. The estimated ejection fraction was in the range of 65%   to 70%. Wall motion was normal; there were no regional wall   motion abnormalities. Doppler parameters are consistent with   abnormal left ventricular relaxation (grade 1 diastolic   dysfunction). Doppler parameters are consistent with  high   ventricular filling pressure. - Aortic valve: Moderately calcified annulus. Trileaflet;   moderately thickened leaflets. Morphologically, there appears to   be calcific aortic stenosis. Unable to quantify degree, although   may be mild to moderate. There was mild regurgitation. - Mitral valve: Normal thickness leaflets . There was mild   regurgitation. - Atrial septum: No defect or patent foramen ovale was identified. - Tricuspid valve: There was mild regurgitation.     Laboratory Data:  Chemistry Recent Labs  Lab 03/26/18 2016 03/28/18 0512  NA 138 139  K 3.7 3.7  CL 108 111  CO2 21* 21*  GLUCOSE 106* 82  BUN 23 22  CREATININE 1.33* 1.54*  CALCIUM 9.8 9.1  GFRNONAA 34* 28*  GFRAA 39* 33*  ANIONGAP 9 7    Recent Labs  Lab 03/26/18 2016  PROT 6.6  ALBUMIN 3.6  AST 65*  ALT 24  ALKPHOS 68  BILITOT 1.2   Hematology Recent Labs  Lab 03/26/18 2016  WBC 9.2  RBC 5.10  HGB 13.4  HCT 42.7  MCV 83.7  MCH 26.3  MCHC 31.4  RDW 15.4  PLT 293   Cardiac Enzymes Recent Labs  Lab 03/26/18 2016 03/27/18 0611  TROPONINI 0.06* 0.06*   No results for input(s): TROPIPOC in the last 168 hours.  BNP Recent Labs  Lab  03/26/18 2016  BNP 455.0*    DDimer No results for input(s): DDIMER in the last 168 hours.  Radiology/Studies:  Dg Chest 2 View  Result Date: 03/26/2018 CLINICAL DATA:  Again all respirations., altered mental status. EXAM: CHEST - 2 VIEW COMPARISON:  Chest x-ray dated 01/08/2016. FINDINGS: Given the supine patient positioning, lungs are clear. No pleural effusion or pneumothorax seen. Heart size and mediastinal contours are within normal limits. Osseous structures about the chest are unremarkable. IMPRESSION: No active cardiopulmonary disease. Electronically Signed   By: Franki Cabot M.D.   On: 03/26/2018 22:02   Ct Head Wo Contrast  Result Date: 03/26/2018 CLINICAL DATA:  Initial evaluation for acute altered mental status, unexplained. EXAM: CT HEAD WITHOUT CONTRAST TECHNIQUE: Contiguous axial images were obtained from the base of the skull through the vertex without intravenous contrast. COMPARISON:  None. FINDINGS: Brain: Generalized age related cerebral atrophy with mild chronic small vessel ischemic disease. No acute intracranial hemorrhage. No acute large vessel territory infarct. No mass lesion, midline shift or mass effect. No hydrocephalus. No extra-axial fluid collection. Vascular: No hyperdense vessel. Calcified atherosclerosis at the skull base. Skull: Scalp soft tissues and calvarium within normal limits. Sinuses/Orbits: Globes and orbital soft tissues demonstrate no acute finding. Visualized paranasal sinuses and mastoid air cells are clear. Other: None. IMPRESSION: 1. No acute intracranial abnormality. 2. Generalized age-related cerebral atrophy with mild chronic small vessel ischemic disease. Electronically Signed   By: Jeannine Boga M.D.   On: 03/26/2018 21:56   Mr Jodene Nam Head Wo Contrast  Result Date: 03/28/2018 CLINICAL DATA:  Altered mental status for 2 days EXAM: MRI HEAD WITHOUT CONTRAST MRA HEAD WITHOUT CONTRAST TECHNIQUE: Multiplanar, multiecho pulse sequences of the  brain and surrounding structures were obtained without intravenous contrast. Angiographic images of the head were obtained using MRA technique without contrast. COMPARISON:  Head CT from 2 days ago FINDINGS: MRI HEAD FINDINGS Brain: Subcentimeter acute infarct in the right upper cerebellum. There is a punctate focus of diffusion hyperintensity in the left frontal parietal white matter that is slightly dark on ADC map. Minimal small vessel ischemic gliosis in the cerebral white  matter. There have been remote lacunar infarcts in the centrum semiovale. Mild for age cerebral volume loss. No hemorrhage, hydrocephalus, collection, or masslike finding. Vascular: Major flow voids are preserved. Skull and upper cervical spine: Normal marrow signal. Sinuses/Orbits: No acute finding.  Bilateral cataract resection Other: Progressively motion degraded study. MRA HEAD FINDINGS Symmetric carotid and vertebral arteries. Bilateral conical inferiorly directed supraclinoid ICA outpouching, likely infundibula. Bilateral P2 segment stenosis that is at least moderate, greater on the left. There is a moderate or advanced distal left M1 segment narrowing. No major branch occlusion. IMPRESSION: Brain MRI: 1. Subcentimeter acute infarct in the right cerebellum. 2. Punctate subacute infarct in the left frontal parietal white matter. 3. Age normal brain volume and mild for age chronic microvascular ischemia. Intracranial MRA: 1. No emergent finding. 2. At least moderate narrowing of the left M1 and bilateral P2 segments. Electronically Signed   By: Monte Fantasia M.D.   On: 03/28/2018 08:31   Mr Brain Wo Contrast  Result Date: 03/28/2018 CLINICAL DATA:  Altered mental status for 2 days EXAM: MRI HEAD WITHOUT CONTRAST MRA HEAD WITHOUT CONTRAST TECHNIQUE: Multiplanar, multiecho pulse sequences of the brain and surrounding structures were obtained without intravenous contrast. Angiographic images of the head were obtained using MRA  technique without contrast. COMPARISON:  Head CT from 2 days ago FINDINGS: MRI HEAD FINDINGS Brain: Subcentimeter acute infarct in the right upper cerebellum. There is a punctate focus of diffusion hyperintensity in the left frontal parietal white matter that is slightly dark on ADC map. Minimal small vessel ischemic gliosis in the cerebral white matter. There have been remote lacunar infarcts in the centrum semiovale. Mild for age cerebral volume loss. No hemorrhage, hydrocephalus, collection, or masslike finding. Vascular: Major flow voids are preserved. Skull and upper cervical spine: Normal marrow signal. Sinuses/Orbits: No acute finding.  Bilateral cataract resection Other: Progressively motion degraded study. MRA HEAD FINDINGS Symmetric carotid and vertebral arteries. Bilateral conical inferiorly directed supraclinoid ICA outpouching, likely infundibula. Bilateral P2 segment stenosis that is at least moderate, greater on the left. There is a moderate or advanced distal left M1 segment narrowing. No major branch occlusion. IMPRESSION: Brain MRI: 1. Subcentimeter acute infarct in the right cerebellum. 2. Punctate subacute infarct in the left frontal parietal white matter. 3. Age normal brain volume and mild for age chronic microvascular ischemia. Intracranial MRA: 1. No emergent finding. 2. At least moderate narrowing of the left M1 and bilateral P2 segments. Electronically Signed   By: Monte Fantasia M.D.   On: 03/28/2018 08:31   US Renal  Result Date: 03/28/2018 CLINICAL DATA:  Acute on chronic renal failure, history hypertension EXAM: RENAL / URINARY TRACT ULTRASOUND COMPLETE COMPARISON:  None FINDINGS: Right Kidney: Renal measurements: 8.0 x 4.8 x 5.1 cm = volume: 101.7 mL. Normal cortical thickness for age. Increased cortical echogenicity. Single complicated cyst at mid RIGHT kidney 3.1 x 2.9 x 3.0 cm containing a single thin partial septation and minimal scattered internal echogenicity. No definite  hydronephrosis or shadowing calcification. Left Kidney: Renal measurements: 8.4 x 5.8 x 4.5 cm = volume: 114.3 mL. Increased cortical echogenicity. Probably normal cortical thickness for age. Large cyst at inferior pole 9.4 x 5.9 x 9.2 cm with minimal dependent debris. No septations or mural nodularity. Additional smaller cysts, largest 3.0 x 3.1 x 3.2 cm with simple features at mid LEFT kidney. Fever than 10 cysts. No definite hydronephrosis or shadowing calcification. Bladder: Appears normal for degree of bladder distention. IMPRESSION: Medical renal disease changes  of both kidneys. Multiple at lateral renal cysts including a large simple cyst at the inferior pole of the RIGHT kidney 9.4 cm in greatest size. Minimally complicated cyst mid RIGHT kidney 3.1 cm greatest size containing a thin partial septation and minimal internal echogenicity. Electronically Signed   By: Lavonia Dana M.D.   On: 03/28/2018 11:02   US Carotid Bilateral  Result Date: 03/28/2018 CLINICAL DATA:  Syncopal episode.  History of hypertension. EXAM: BILATERAL CAROTID DUPLEX ULTRASOUND TECHNIQUE: Pearline Cables scale imaging, color Doppler and duplex ultrasound were performed of bilateral carotid and vertebral arteries in the neck. COMPARISON:  None. FINDINGS: Criteria: Quantification of carotid stenosis is based on velocity parameters that correlate the residual internal carotid diameter with NASCET-based stenosis levels, using the diameter of the distal internal carotid lumen as the denominator for stenosis measurement. The following velocity measurements were obtained: RIGHT ICA:  61/15 cm/sec CCA:  17/6 cm/sec SYSTOLIC ICA/CCA RATIO:  1.0 ECA:  50 cm/sec LEFT ICA:  69/19 cm/sec CCA:  16/07 cm/sec SYSTOLIC ICA/CCA RATIO:  1.1 ECA:  41 cm/sec RIGHT CAROTID ARTERY: There is mild tortuosity of the right common carotid artery (representative image 3). There is a minimal amount of mixed echogenic plaque within the distal aspect the right common  carotid artery (image 11). There is a minimal amount of echogenic plaque within the right carotid bulb (images 14 and 16), extending to involve the origin and proximal aspects of the right internal carotid artery (image 24), not resulting in elevated peak systolic velocities within the interrogated course of the right internal carotid artery to suggest a hemodynamically significant stenosis. RIGHT VERTEBRAL ARTERY:  Antegrade flow LEFT CAROTID ARTERY: There is a minimal amount of echogenic plaque within the distal aspect the left common carotid artery (image 45). There is a minimal amount of mixed echogenic partially shadowing plaque within the left carotid bulb (image 50), extending to involve the origin and proximal aspects of the left internal carotid artery (image 58), not resulting in elevated peak systolic velocities within the interrogated course of the left internal carotid artery to suggest a hemodynamically significant stenosis. LEFT VERTEBRAL ARTERY:  Antegrade flow IMPRESSION: Minimal to moderate amount of bilateral atherosclerotic plaque, right greater than left, not resulting in a hemodynamically significant stenosis within either internal carotid artery. Electronically Signed   By: Sandi Mariscal M.D.   On: 03/28/2018 10:25   US Venous Img Lower Bilateral  Result Date: 03/28/2018 CLINICAL DATA:  BILATERAL leg pain and edema EXAM: BILATERAL LOWER EXTREMITY VENOUS DOPPLER ULTRASOUND TECHNIQUE: Gray-scale sonography with graded compression, as well as color Doppler and duplex ultrasound were performed to evaluate the lower extremity deep venous systems from the level of the common femoral vein and including the common femoral, femoral, profunda femoral, popliteal and calf veins including the posterior tibial, peroneal and gastrocnemius veins when visible. The superficial great saphenous vein was also interrogated. Spectral Doppler was utilized to evaluate flow at rest and with distal augmentation  maneuvers in the common femoral, femoral and popliteal veins. COMPARISON:  None FINDINGS: RIGHT LOWER EXTREMITY Common Femoral Vein: No evidence of thrombus. Normal compressibility, respiratory phasicity and response to augmentation. Saphenofemoral Junction: No evidence of thrombus. Normal compressibility and flow on color Doppler imaging. Profunda Femoral Vein: No evidence of thrombus. Normal compressibility and flow on color Doppler imaging. Femoral Vein: No evidence of thrombus. Normal compressibility, respiratory phasicity and response to augmentation. Popliteal Vein: No evidence of thrombus. Normal compressibility, respiratory phasicity and response to augmentation. Calf Veins: No  evidence of thrombus. Normal compressibility and flow on color Doppler imaging. Superficial Great Saphenous Vein: No evidence of thrombus. Normal compressibility. Venous Reflux:  None. Other Findings:  None. LEFT LOWER EXTREMITY Common Femoral Vein: No evidence of thrombus. Normal compressibility, respiratory phasicity and response to augmentation. Saphenofemoral Junction: No evidence of thrombus. Normal compressibility and flow on color Doppler imaging. Profunda Femoral Vein: No evidence of thrombus. Normal compressibility and flow on color Doppler imaging. Femoral Vein: No evidence of thrombus. Normal compressibility, respiratory phasicity and response to augmentation. Popliteal Vein: No evidence of thrombus. Normal compressibility, respiratory phasicity and response to augmentation. Calf Veins: No evidence of thrombus. Normal compressibility and flow on color Doppler imaging. Superficial Great Saphenous Vein: No evidence of thrombus. Normal compressibility. Venous Reflux:  None. Other Findings:  None. IMPRESSION: No evidence of deep venous thrombosis in either lower extremity. Electronically Signed   By: Lavonia Dana M.D.   On: 03/28/2018 09:45   Dg Hips Bilat W Or Wo Pelvis 3-4 Views  Result Date: 03/26/2018 CLINICAL DATA:   Fall, altered mental status. EXAM: DG HIP (WITH OR WITHOUT PELVIS) 3-4V BILAT COMPARISON:  None. FINDINGS: Osseous alignment is normal. No fracture line or displaced fracture fragment identified. No significant degenerative change at either hip joint. Intramedullary rod within the LEFT femur is incompletely imaged. Soft tissues about the pelvis and hips are unremarkable. IMPRESSION: No acute findings.  No osseous fracture or dislocation seen. Electronically Signed   By: Franki Cabot M.D.   On: 03/26/2018 22:04    Assessment and Plan:   1. Unresponsiveness with agonal breathing responded to oxygen suspect secondary to CVA. No evidence of cardiac event. Continue to monitor on telemetry. 2. Acute CVA on MRI 3. Abnormal EKG with right bundle branch block and first-degree AV block, normal LV function on 2D echo with mild LVH, grade 1 DD, mild to moderate aortic stenosis.  Troponins flat 4. Hypertension 5. Rhabdomyolysis secondary to collapse  [facility] will sign off.   Medication Recommendations:    Other recommendations (labs, testing, etc):  none Follow up as an outpatient:  prn  For questions or updates, please contact [place] Please consult [email or web address] for contact info under     Signed, Ermalinda Barrios, PA-C  03/28/2018 12:20 PM   The patient was seen and examined, and I agree with the history, physical exam, assessment and plan as documented above, with modifications as noted below. I have also personally reviewed all relevant documentation, old records, labs, and both radiographic and cardiovascular studies. I have also independently interpreted old and new ECG's.  Briefly, this is a 84 year old woman who lives independently and continues to drive.  She was sitting with her nephew and she slumped over in her chair and became unresponsive with agonal respirations.  She denies any antecedent chest pain, shortness of breath, or palpitations.  She was hypoxic with O2 sats of  56% and EMS provided rescue breathing.  She developed some rhabdomyolysis.  Her nephew provides a good history and says his aunt is always enjoyed good health and does things and apparently.  She told me that she walks with a walker.  I personally reviewed the ECG which demonstrates normal sinus rhythm with first-degree AV block and right bundle branch block.  I also reviewed the echocardiogram which demonstrates normal left ventricular systolic function, mild LVH, grade 1 diastolic dysfunction, and mild to moderate aortic stenosis with mild aortic and mitral regurgitation.  She is given IV fluids for rhabdomyolysis.  She  is acute on chronic renal insufficiency.  Creatinine is improving.  Carotid Doppler showed no evidence of hemodynamically significant ICA stenosis.  Brain MRI/MRA demonstrated a subcentimeter acute infarct in the right cerebellum and a punctate subacute infarct in the left frontal parietal white matter.  There was otherwise normal age-related chronic microvascular ischemia.  Her presentation appears to be consistent with her acute CVA.  This does not appear to be cardiac in etiology.  She has been monitored on telemetry which shows sinus rhythm with PVCs.  There has been no significant bradycardia nor sinus pauses.  At the present time, I do not feel any further cardiac testing is indicated.  Kate Sable, MD, [facility]  03/28/2018 12:36 PM

## 2018-03-28 NOTE — Progress Notes (Signed)
SLP Cancellation Note  Patient Details Name: Toni Gomez MRN: [id number] DOB: 01/21/1924   Cancelled treatment:       Reason Eval/Treat Not Completed: Other (comment)(Pt passed [facility] swallow screen and RN reports that Pt is eating and drinking well.). If SLE desired, please order.   Thank you,  Genene Churn, [place]    [place] 03/28/2018, 11:54 AM

## 2018-03-28 NOTE — Progress Notes (Signed)
OT Cancellation Note  Patient Details Name: Toni Gomez MRN: [id number] DOB: May 19, 1923   Cancelled Treatment:    Reason Eval/Treat Not Completed: Patient at procedure or test/ unavailable.   Ailene Ravel, OTR/L,CBIS  [phone]  03/28/2018, 9:11 AM

## 2018-03-28 NOTE — Evaluation (Signed)
Physical Therapy Evaluation Patient Details Name: Toni Gomez MRN: [id number] DOB: December 29, 1923 Today's Date: 03/28/2018   History of Present Illness   Toni Gomez is a 84 y.o. female with medical history significant of hypertension who was brought to the emergency department via EMS after being found sitting in her chair at home with agonal respirations.  EMS found her to be 56% on room air.  They gave her rescue breathing with AMBU bag and supplemental oxygen, she became responsive.  The patient is unable to remember what happened, but was able to response to all my questions properly.  She denies fever, headache, sore throat, wheezing, chest pain, palpitations, dizziness, diaphoresis, PND orthopnea.  She occasionally gets lower extremity edema.  She denies nausea, emesis, diarrhea, constipation, melena or hematochezia.  No dysuria or hematuria.  Denies polyuria, polydipsia or polyphagia.  She denies skin pruritus.  She stated that her appetite and sleep are good.    Clinical Impression  Patient demonstrates slow labored movement for sitting up at bedside, unable to stand using RW mostly due to poor carryover after instructions, tends to lean backwards pushing against RW, required Max assist stand pivot to transfer to chair, unable to take steps due to BLE weakness.  Patient tolerated sitting up in chair after therapy with her nephew present in room.  Patient will benefit from continued physical therapy in hospital and recommended venue below to increase strength, balance, endurance for safe ADLs and gait.    Follow Up Recommendations SNF    Equipment Recommendations  None recommended by PT    Recommendations for Other Services       Precautions / Restrictions Precautions Precautions: Fall Restrictions Weight Bearing Restrictions: No      Mobility  Bed Mobility Overal bed mobility: Needs Assistance Bed Mobility: Supine to Sit     Supine to sit: Max assist     General bed  mobility comments: slow labored  Transfers Overall transfer level: Needs assistance Equipment used: Rolling walker (2 wheeled) Transfers: Sit to/from [facility] Sit to Stand: Max assist Stand pivot transfers: Max assist       General transfer comment: Patient unable to stand using RW, required stand pivot to tansfer to chair  Ambulation/Gait                Stairs            Wheelchair Mobility    Modified Rankin (Stroke Patients Only)       Balance Overall balance assessment: Needs assistance Sitting-balance support: Feet supported;No upper extremity supported Sitting balance-Leahy Scale: Fair     Standing balance support: During functional activity;Bilateral upper extremity supported Standing balance-Leahy Scale: Poor Standing balance comment: unable to maintain standing balance using RW                             Pertinent Vitals/Pain Pain Assessment: No/denies pain    Home Living Family/patient expects to be discharged to:: Private residence Living Arrangements: Alone Available Help at Discharge: Family(nephew) Type of Home: House Home Access: Level entry     Home Layout: One level Home Equipment: Environmental consultant - 2 wheels;Cane - single point;Shower seat;Bedside commode;Wheelchair - manual      Prior Function Level of Independence: Independent with assistive device(s)         Comments: household ambulator with SPC, uses RW for longer distances     Hand Dominance  Extremity/Trunk Assessment   Upper Extremity Assessment Upper Extremity Assessment: Generalized weakness    Lower Extremity Assessment Lower Extremity Assessment: Generalized weakness    Cervical / Trunk Assessment Cervical / Trunk Assessment: Normal  Communication   Communication: No difficulties  Cognition Arousal/Alertness: Awake/alert Behavior During Therapy: WFL for tasks assessed/performed Overall Cognitive Status: Within  Functional Limits for tasks assessed                                        General Comments      Exercises     Assessment/Plan    PT Assessment Patient needs continued PT services  PT Problem List Decreased strength;Decreased activity tolerance;Decreased balance;Decreased mobility       PT Treatment Interventions Gait training;Functional mobility training;Therapeutic activities;Therapeutic exercise;Patient/family education    PT Goals (Current goals can be found in the Care Plan section)  Acute Rehab PT Goals Patient Stated Goal: return home after rehab PT Goal Formulation: With patient/family Time For Goal Achievement: 04/11/18 Potential to Achieve Goals: Good    Frequency Min 3X/week   Barriers to discharge        Co-evaluation               AM-PAC PT "6 Clicks" Daily Activity  Outcome Measure Difficulty turning over in bed (including adjusting bedclothes, sheets and blankets)?: Unable Difficulty moving from lying on back to sitting on the side of the bed? : Unable Difficulty sitting down on and standing up from a chair with arms (e.g., wheelchair, bedside commode, etc,.)?: Unable Help needed moving to and from a bed to chair (including a wheelchair)?: A Lot Help needed walking in hospital room?: Total Help needed climbing 3-5 steps with a railing? : Total 6 Click Score: 7    End of Session Equipment Utilized During Treatment: Gait belt Activity Tolerance: Patient limited by fatigue;Patient tolerated treatment well Patient left: in chair;with call bell/phone within reach;with family/visitor present;with chair alarm set Nurse Communication: Mobility status PT Visit Diagnosis: Unsteadiness on feet (R26.81);Other abnormalities of gait and mobility (R26.89);Muscle weakness (generalized) (M62.81)    Time: 4665-9935 PT Time Calculation (min) (ACUTE ONLY): 34 min   Charges:   PT Evaluation $PT Eval Moderate Complexity: 1 Mod PT  Treatments $Therapeutic Activity: 23-37 mins        2:39 PM, 03/28/18 Lonell Grandchild, MPT Physical Therapist with [facility] 336 [phone] office [phone] mobile phone

## 2018-03-28 NOTE — Clinical Social Work Note (Signed)
Clinical Social Work Assessment  Patient Details  Name: Toni Gomez MRN: [id number] Date of Birth: Apr 03, 1924  Date of referral:  03/28/18               Reason for consult:  Facility Placement                Permission sought to share information with:    Permission granted to share information::     Name::        Agency::     Relationship::     Contact Information:  neice and nephew both at bedside.   Housing/Transportation Living arrangements for the past 2 months:  [place] of Information:  Patient Patient Interpreter Needed:  None Criminal Activity/Legal Involvement Pertinent to Current Situation/Hospitalization:  No - Comment as needed Significant Relationships:  Other Family Members Lives with:  Self Do you feel safe going back to the place where you live?  Yes Need for family participation in patient care:  Yes (Comment)  Care giving concerns:  None identified at baseline. Patient was independent.    Social Worker assessment / plan:  Patient is independent at baseline. She drives and is independent in ADLs. She is agreeable to short term rehab at [facility].   Employment status:  Retired Nurse, adult PT Recommendations:  [place] / Referral to community resources:  [facility]  Patient/Family's Response to care:  Patient is agreeable to short term rehab at [facility].   Patient/Family's Understanding of and Emotional Response to Diagnosis, Current Treatment, and Prognosis:  Patient understands her diagnosis, treatment and prognosis and feels that short term rehab is appropriate at this time.   Emotional Assessment Appearance:  Appears stated age Attitude/Demeanor/Rapport:    Affect (typically observed):  Calm, Accepting Orientation:  Oriented to Self, Oriented to Place, Oriented to  Time, Oriented to Situation Alcohol / Substance use:  Not Applicable Psych involvement (Current and /or in  the community):  No (Comment)  Discharge Needs  Concerns to be addressed:  Discharge Planning Concerns Readmission within the last 30 days:  No Current discharge risk:  None Barriers to Discharge:  No Barriers Identified   Ihor Gully, LCSW 03/28/2018, 3:32 PM

## 2018-03-28 NOTE — NC FL2 (Signed)
[place] LEVEL OF CARE SCREENING TOOL     IDENTIFICATION  Patient Name: AKIMA SLAUGH Birthdate: [birth year] Sex: female Admission Date (Current Location): 03/26/2018  [facility] and [place] Number:  [employer] and Address:  [facility] [address] [place]      Provider Number: [id number]  Attending Physician Name and Address:  Orson Eva, MD  Relative Name and Phone Number:       Current Level of Care: Hospital Recommended Level of Care: [place] Prior Approval Number:    Date Approved/Denied:   PASRR Number: [id number]  Discharge Plan: SNF    Current Diagnoses: Patient Active Problem List   Diagnosis Date Noted  . Abnormal EKG 03/27/2018  . Syncope and collapse 03/27/2018  . Acute encephalopathy 03/27/2018  . Altered mental status 03/26/2018  . Rhabdomyolysis 03/26/2018  . Hypertension 03/26/2018  . Elevated troponin 03/26/2018    Orientation RESPIRATION BLADDER Height & Weight     Self, Time, Situation, Place  Normal Incontinent Weight: 150 lb 12.7 oz (68.4 kg) Height:  5\' 1"  (154.9 cm)  BEHAVIORAL SYMPTOMS/MOOD NEUROLOGICAL BOWEL NUTRITION STATUS      Continent Diet(regular )  AMBULATORY STATUS COMMUNICATION OF NEEDS Skin   Extensive Assist Verbally Normal                       Personal Care Assistance Level of Assistance  Bathing, Feeding, Dressing Bathing Assistance: Limited assistance Feeding assistance: Independent Dressing Assistance: Limited assistance     Functional Limitations Info  Sight, Hearing, Speech Sight Info: Adequate Hearing Info: Adequate Speech Info: Adequate    SPECIAL CARE FACTORS FREQUENCY  PT (By licensed PT)     PT Frequency: 5x/week              Contractures Contractures Info: Not present    Additional Factors Info  Code Status, Allergies, Psychotropic Code Status Info: Full Code Allergies Info: NKA Psychotropic Info: Ativan         Current Medications (03/28/2018):  This is the current hospital active medication list Current Facility-Administered Medications  Medication Dose Route Frequency Provider Last Rate Last Dose  . 0.9 % NaCl with KCl 20 mEq/ L  infusion   Intravenous Continuous Tat, David, MD 75 mL/hr at 03/27/18 1536    . acetaminophen (TYLENOL) tablet 650 mg  650 mg Oral Q6H PRN Reubin Milan, MD   650 mg at 03/28/18 1143   Or  . acetaminophen (TYLENOL) suppository 650 mg  650 mg Rectal Q6H PRN Reubin Milan, MD      . aspirin EC tablet 81 mg  81 mg Oral Daily Reubin Milan, MD   81 mg at 03/28/18 0939  . heparin injection 5,000 Units  5,000 Units Subcutaneous Q8H Reubin Milan, MD   5,000 Units at 03/28/18 0446  . hydrALAZINE (APRESOLINE) injection 10 mg  10 mg Intravenous Q4H PRN Reubin Milan, MD      . LORazepam (ATIVAN) tablet 0.5 mg  0.5 mg Oral Q4H PRN Reubin Milan, MD      . ondansetron [facility]) tablet 4 mg  4 mg Oral Q6H PRN Reubin Milan, MD       Or  . ondansetron [facility]) injection 4 mg  4 mg Intravenous Q6H PRN Reubin Milan, MD         Discharge Medications: Please see discharge summary for a list of discharge medications.  Relevant  Imaging Results:  Relevant Lab Results:   Additional Information SSN [id number] Clydene Pugh, LCSW

## 2018-03-28 NOTE — Progress Notes (Signed)
PROGRESS NOTE  Toni Gomez TGG:[id number] DOB: [birth year] DOA: 03/26/2018 PCP: Anda Kraft, MD   Brief History:  84 year old female with history of hypertension and anxiety presenting with a syncopal episode.  The patient was sitting next to her nephew when she slumped over her chair and became unconscious in the afternoon of 03/26/2018.  The patient denied any aura, dizziness, chest discomfort, shortness breath, or palpitations.  EMS was activated, and the patient was noted to have oxygen saturation in the 50s.  The patient was subsequently Ambu bag and subsequently woke up.  During the right in the ambulance, the patient was awake and conversive, but was confused.  The patient's mental status continued to improve such that by the time she had a work-up in the emergency department, she was awake and conversant.  There was no tonic-clonic activity.  There was no tongue biting or bowel or bladder incontinence.  The patient's nephew states that the patient has been in her usual state of health prior to this episode.  There is been no new changes in her medications.  She denies taking any over-the-counter medicines except for occasional acetaminophen.  There is not been any headaches, nausea, vomiting, diarrhea, abdominal pain, dysuria, hematuria.  In the emergency department, the patient was afebrile hemodynamically stable.  BMP and CBC were essentially unremarkable.  CT the brain was negative.  Chest x-ray was negative.  EKG shows sinus rhythm with right bundle branch block.  Assessment/Plan: Syncope -Echocardiogram--EF 65-70%, on WMA, mild AI,TR,MR; mild to mod AS -cardiology consult appreciated-->no further workup -Orthostatic vital signs -EEG--pending  Acute Ischemic Stroke -PT/OT evaluation-->SNF -Speech therapy eval--regular diet -CT brain--neg -MRI brain--sub cm acute infart of R-cerebellum; punctate subacute infarct L-frontal parietal lobe -Urinalysis negative for  pyuria -MRA brain--neg for hemodynamically significant stenosis -Carotid Duplex--neg for hemodynamically significant stenosis -Echo---EF 65-70%, on WMA, mild AI,TR,MR; mild to mod AS -LDL--118 -HbA1C--5.3 -Antiplatelet--ASA 81+plavix  Acute encephalopathy, type unspecified -MRI brain--sub cm acute infart of R-cerebellum; punctate subacute infarct L-frontal parietal lobe -Urinalysis negative for pyuria -Serum B12--188-->since low normal-->start supplementation -Folic VOJJ--0.0 -XFG--1.829 -ammonia--25  Rhabdomyolysis -CPK peaked 1856>>>598 -Continue IV fluids  Hyperlipidemia -start statin when CK improves  Lower extremity edema and pain -Venous duplex rule out DVT--neg  Essential hypertension -holding amlodipine to allow for permissive hypertension  Elevated troponin -trend is flat -personally reviewed EKG--sinus, RBBB -no chest pain  CKD stage 3 -baseline creatinine 1.3-1.5     Disposition Plan:   SNF 11/12 if stable  Family Communication:   Niece/nephew at bedside 11/11--Total time spent 35 minutes.  Greater than 50% spent face to face counseling and coordinating care.   Consultants:  none  Code Status:  FULL  DVT Prophylaxis:  [place] Heparin    Procedures: As Listed in Progress Note Above  Antibiotics: None     Subjective: Patient denies fevers, chills, headache, chest pain, dyspnea, nausea, vomiting, diarrhea, abdominal pain, dysuria, hematuria, hematochezia, and melena. She complains of some pain and soreness of bilateral legs, mild to moderate, worse with movement.  Objective: Vitals:   03/27/18 2157 03/28/18 0616 03/28/18 1000 03/28/18 1403  BP: (!) 147/6 (!) 140/57 (!) 168/69 (!) 158/60  Pulse: 67 71 74 61  Resp: 18 18  20   Temp: 98.5 F (36.9 C) 98.2 F (36.8 C) (!) 97.4 F (36.3 C)   TempSrc: Oral Oral Axillary   SpO2: 98% 99% 100% 100%  Weight:      Height:  Intake/Output Summary (Last 24 hours) at 03/28/2018  1651 Last data filed at 03/28/2018 1300 Gross per 24 hour  Intake 960 ml  Output 2000 ml  Net -1040 ml   Weight change:  Exam:   General:  Pt is alert, follows commands appropriately, not in acute distress  HEENT: No icterus, No thrush, No neck mass, Gerald/AT  Cardiovascular: RRR, S1/S2, no rubs, no gallops  Respiratory: CTA bilaterally, no wheezing, no crackles, no rhonchi  Abdomen: Soft/+BS, non tender, non distended, no guarding  Extremities: 1+ LE edema, No lymphangitis, No petechiae, No rashes, no synovitis   Data Reviewed: I have personally reviewed following labs and imaging studies Basic Metabolic Panel: Recent Labs  Lab 03/26/18 2016 03/28/18 0512  NA 138 139  K 3.7 3.7  CL 108 111  CO2 21* 21*  GLUCOSE 106* 82  BUN 23 22  CREATININE 1.33* 1.54*  CALCIUM 9.8 9.1   Liver Function Tests: Recent Labs  Lab 03/26/18 2016  AST 65*  ALT 24  ALKPHOS 68  BILITOT 1.2  PROT 6.6  ALBUMIN 3.6   No results for input(s): LIPASE, AMYLASE in the last 168 hours. Recent Labs  Lab 03/28/18 0512  AMMONIA 25   Coagulation Profile: No results for input(s): INR, PROTIME in the last 168 hours. CBC: Recent Labs  Lab 03/26/18 2016  WBC 9.2  HGB 13.4  HCT 42.7  MCV 83.7  PLT 293   Cardiac Enzymes: Recent Labs  Lab 03/26/18 2016 03/27/18 0611 03/28/18 0512  CKTOTAL 3,077* 1,856* 598*  TROPONINI 0.06* 0.06*  --    BNP: Invalid input(s): POCBNP CBG: Recent Labs  Lab 03/26/18 2033  GLUCAP 95   HbA1C: Recent Labs    03/27/18 0611  HGBA1C 5.3   Urine analysis:    Component Value Date/Time   COLORURINE YELLOW 03/27/2018 0800   APPEARANCEUR CLEAR 03/27/2018 0800   LABSPEC 1.012 03/27/2018 0800   PHURINE 5.0 03/27/2018 0800   GLUCOSEU NEGATIVE 03/27/2018 0800   HGBUR MODERATE (A) 03/27/2018 0800   BILIRUBINUR NEGATIVE 03/27/2018 0800   KETONESUR 5 (A) 03/27/2018 0800   PROTEINUR 100 (A) 03/27/2018 0800   NITRITE NEGATIVE 03/27/2018 0800    LEUKOCYTESUR NEGATIVE 03/27/2018 0800   Sepsis Labs: @LABRCNTIP (procalcitonin:4,lacticidven:4) )No results found for this or any previous visit (from the past 240 hour(s)).   Scheduled Meds: . aspirin EC  81 mg Oral Daily  . clopidogrel  75 mg Oral Daily  . heparin  5,000 Units Subcutaneous Q8H   Continuous Infusions:  Procedures/Studies: Dg Chest 2 View  Result Date: 03/26/2018 CLINICAL DATA:  Again all respirations., altered mental status. EXAM: CHEST - 2 VIEW COMPARISON:  Chest x-ray dated 01/08/2016. FINDINGS: Given the supine patient positioning, lungs are clear. No pleural effusion or pneumothorax seen. Heart size and mediastinal contours are within normal limits. Osseous structures about the chest are unremarkable. IMPRESSION: No active cardiopulmonary disease. Electronically Signed   By: Franki Cabot M.D.   On: 03/26/2018 22:02   Ct Head Wo Contrast  Result Date: 03/26/2018 CLINICAL DATA:  Initial evaluation for acute altered mental status, unexplained. EXAM: CT HEAD WITHOUT CONTRAST TECHNIQUE: Contiguous axial images were obtained from the base of the skull through the vertex without intravenous contrast. COMPARISON:  None. FINDINGS: Brain: Generalized age related cerebral atrophy with mild chronic small vessel ischemic disease. No acute intracranial hemorrhage. No acute large vessel territory infarct. No mass lesion, midline shift or mass effect. No hydrocephalus. No extra-axial fluid collection. Vascular: No hyperdense vessel.  Calcified atherosclerosis at the skull base. Skull: Scalp soft tissues and calvarium within normal limits. Sinuses/Orbits: Globes and orbital soft tissues demonstrate no acute finding. Visualized paranasal sinuses and mastoid air cells are clear. Other: None. IMPRESSION: 1. No acute intracranial abnormality. 2. Generalized age-related cerebral atrophy with mild chronic small vessel ischemic disease. Electronically Signed   By: Jeannine Boga M.D.   On:  03/26/2018 21:56   Mr Jodene Nam Head Wo Contrast  Result Date: 03/28/2018 CLINICAL DATA:  Altered mental status for 2 days EXAM: MRI HEAD WITHOUT CONTRAST MRA HEAD WITHOUT CONTRAST TECHNIQUE: Multiplanar, multiecho pulse sequences of the brain and surrounding structures were obtained without intravenous contrast. Angiographic images of the head were obtained using MRA technique without contrast. COMPARISON:  Head CT from 2 days ago FINDINGS: MRI HEAD FINDINGS Brain: Subcentimeter acute infarct in the right upper cerebellum. There is a punctate focus of diffusion hyperintensity in the left frontal parietal white matter that is slightly dark on ADC map. Minimal small vessel ischemic gliosis in the cerebral white matter. There have been remote lacunar infarcts in the centrum semiovale. Mild for age cerebral volume loss. No hemorrhage, hydrocephalus, collection, or masslike finding. Vascular: Major flow voids are preserved. Skull and upper cervical spine: Normal marrow signal. Sinuses/Orbits: No acute finding.  Bilateral cataract resection Other: Progressively motion degraded study. MRA HEAD FINDINGS Symmetric carotid and vertebral arteries. Bilateral conical inferiorly directed supraclinoid ICA outpouching, likely infundibula. Bilateral P2 segment stenosis that is at least moderate, greater on the left. There is a moderate or advanced distal left M1 segment narrowing. No major branch occlusion. IMPRESSION: Brain MRI: 1. Subcentimeter acute infarct in the right cerebellum. 2. Punctate subacute infarct in the left frontal parietal white matter. 3. Age normal brain volume and mild for age chronic microvascular ischemia. Intracranial MRA: 1. No emergent finding. 2. At least moderate narrowing of the left M1 and bilateral P2 segments. Electronically Signed   By: Monte Fantasia M.D.   On: 03/28/2018 08:31   Mr Brain Wo Contrast  Result Date: 03/28/2018 CLINICAL DATA:  Altered mental status for 2 days EXAM: MRI HEAD  WITHOUT CONTRAST MRA HEAD WITHOUT CONTRAST TECHNIQUE: Multiplanar, multiecho pulse sequences of the brain and surrounding structures were obtained without intravenous contrast. Angiographic images of the head were obtained using MRA technique without contrast. COMPARISON:  Head CT from 2 days ago FINDINGS: MRI HEAD FINDINGS Brain: Subcentimeter acute infarct in the right upper cerebellum. There is a punctate focus of diffusion hyperintensity in the left frontal parietal white matter that is slightly dark on ADC map. Minimal small vessel ischemic gliosis in the cerebral white matter. There have been remote lacunar infarcts in the centrum semiovale. Mild for age cerebral volume loss. No hemorrhage, hydrocephalus, collection, or masslike finding. Vascular: Major flow voids are preserved. Skull and upper cervical spine: Normal marrow signal. Sinuses/Orbits: No acute finding.  Bilateral cataract resection Other: Progressively motion degraded study. MRA HEAD FINDINGS Symmetric carotid and vertebral arteries. Bilateral conical inferiorly directed supraclinoid ICA outpouching, likely infundibula. Bilateral P2 segment stenosis that is at least moderate, greater on the left. There is a moderate or advanced distal left M1 segment narrowing. No major branch occlusion. IMPRESSION: Brain MRI: 1. Subcentimeter acute infarct in the right cerebellum. 2. Punctate subacute infarct in the left frontal parietal white matter. 3. Age normal brain volume and mild for age chronic microvascular ischemia. Intracranial MRA: 1. No emergent finding. 2. At least moderate narrowing of the left M1 and bilateral P2 segments. Electronically  Signed   By: Monte Fantasia M.D.   On: 03/28/2018 08:31   US Renal  Result Date: 03/28/2018 CLINICAL DATA:  Acute on chronic renal failure, history hypertension EXAM: RENAL / URINARY TRACT ULTRASOUND COMPLETE COMPARISON:  None FINDINGS: Right Kidney: Renal measurements: 8.0 x 4.8 x 5.1 cm = volume: 101.7 mL.  Normal cortical thickness for age. Increased cortical echogenicity. Single complicated cyst at mid RIGHT kidney 3.1 x 2.9 x 3.0 cm containing a single thin partial septation and minimal scattered internal echogenicity. No definite hydronephrosis or shadowing calcification. Left Kidney: Renal measurements: 8.4 x 5.8 x 4.5 cm = volume: 114.3 mL. Increased cortical echogenicity. Probably normal cortical thickness for age. Large cyst at inferior pole 9.4 x 5.9 x 9.2 cm with minimal dependent debris. No septations or mural nodularity. Additional smaller cysts, largest 3.0 x 3.1 x 3.2 cm with simple features at mid LEFT kidney. Fever than 10 cysts. No definite hydronephrosis or shadowing calcification. Bladder: Appears normal for degree of bladder distention. IMPRESSION: Medical renal disease changes of both kidneys. Multiple at lateral renal cysts including a large simple cyst at the inferior pole of the RIGHT kidney 9.4 cm in greatest size. Minimally complicated cyst mid RIGHT kidney 3.1 cm greatest size containing a thin partial septation and minimal internal echogenicity. Electronically Signed   By: Lavonia Dana M.D.   On: 03/28/2018 11:02   US Carotid Bilateral  Result Date: 03/28/2018 CLINICAL DATA:  Syncopal episode.  History of hypertension. EXAM: BILATERAL CAROTID DUPLEX ULTRASOUND TECHNIQUE: Pearline Cables scale imaging, color Doppler and duplex ultrasound were performed of bilateral carotid and vertebral arteries in the neck. COMPARISON:  None. FINDINGS: Criteria: Quantification of carotid stenosis is based on velocity parameters that correlate the residual internal carotid diameter with NASCET-based stenosis levels, using the diameter of the distal internal carotid lumen as the denominator for stenosis measurement. The following velocity measurements were obtained: RIGHT ICA:  61/15 cm/sec CCA:  81/8 cm/sec SYSTOLIC ICA/CCA RATIO:  1.0 ECA:  50 cm/sec LEFT ICA:  69/19 cm/sec CCA:  29/93 cm/sec SYSTOLIC ICA/CCA  RATIO:  1.1 ECA:  41 cm/sec RIGHT CAROTID ARTERY: There is mild tortuosity of the right common carotid artery (representative image 3). There is a minimal amount of mixed echogenic plaque within the distal aspect the right common carotid artery (image 11). There is a minimal amount of echogenic plaque within the right carotid bulb (images 14 and 16), extending to involve the origin and proximal aspects of the right internal carotid artery (image 24), not resulting in elevated peak systolic velocities within the interrogated course of the right internal carotid artery to suggest a hemodynamically significant stenosis. RIGHT VERTEBRAL ARTERY:  Antegrade flow LEFT CAROTID ARTERY: There is a minimal amount of echogenic plaque within the distal aspect the left common carotid artery (image 45). There is a minimal amount of mixed echogenic partially shadowing plaque within the left carotid bulb (image 50), extending to involve the origin and proximal aspects of the left internal carotid artery (image 58), not resulting in elevated peak systolic velocities within the interrogated course of the left internal carotid artery to suggest a hemodynamically significant stenosis. LEFT VERTEBRAL ARTERY:  Antegrade flow IMPRESSION: Minimal to moderate amount of bilateral atherosclerotic plaque, right greater than left, not resulting in a hemodynamically significant stenosis within either internal carotid artery. Electronically Signed   By: Sandi Mariscal M.D.   On: 03/28/2018 10:25   US Venous Img Lower Bilateral  Result Date: 03/28/2018 CLINICAL DATA:  BILATERAL  leg pain and edema EXAM: BILATERAL LOWER EXTREMITY VENOUS DOPPLER ULTRASOUND TECHNIQUE: Gray-scale sonography with graded compression, as well as color Doppler and duplex ultrasound were performed to evaluate the lower extremity deep venous systems from the level of the common femoral vein and including the common femoral, femoral, profunda femoral, popliteal and calf  veins including the posterior tibial, peroneal and gastrocnemius veins when visible. The superficial great saphenous vein was also interrogated. Spectral Doppler was utilized to evaluate flow at rest and with distal augmentation maneuvers in the common femoral, femoral and popliteal veins. COMPARISON:  None FINDINGS: RIGHT LOWER EXTREMITY Common Femoral Vein: No evidence of thrombus. Normal compressibility, respiratory phasicity and response to augmentation. Saphenofemoral Junction: No evidence of thrombus. Normal compressibility and flow on color Doppler imaging. Profunda Femoral Vein: No evidence of thrombus. Normal compressibility and flow on color Doppler imaging. Femoral Vein: No evidence of thrombus. Normal compressibility, respiratory phasicity and response to augmentation. Popliteal Vein: No evidence of thrombus. Normal compressibility, respiratory phasicity and response to augmentation. Calf Veins: No evidence of thrombus. Normal compressibility and flow on color Doppler imaging. Superficial Great Saphenous Vein: No evidence of thrombus. Normal compressibility. Venous Reflux:  None. Other Findings:  None. LEFT LOWER EXTREMITY Common Femoral Vein: No evidence of thrombus. Normal compressibility, respiratory phasicity and response to augmentation. Saphenofemoral Junction: No evidence of thrombus. Normal compressibility and flow on color Doppler imaging. Profunda Femoral Vein: No evidence of thrombus. Normal compressibility and flow on color Doppler imaging. Femoral Vein: No evidence of thrombus. Normal compressibility, respiratory phasicity and response to augmentation. Popliteal Vein: No evidence of thrombus. Normal compressibility, respiratory phasicity and response to augmentation. Calf Veins: No evidence of thrombus. Normal compressibility and flow on color Doppler imaging. Superficial Great Saphenous Vein: No evidence of thrombus. Normal compressibility. Venous Reflux:  None. Other Findings:  None.  IMPRESSION: No evidence of deep venous thrombosis in either lower extremity. Electronically Signed   By: Lavonia Dana M.D.   On: 03/28/2018 09:45   Dg Hips Bilat W Or Wo Pelvis 3-4 Views  Result Date: 03/26/2018 CLINICAL DATA:  Fall, altered mental status. EXAM: DG HIP (WITH OR WITHOUT PELVIS) 3-4V BILAT COMPARISON:  None. FINDINGS: Osseous alignment is normal. No fracture line or displaced fracture fragment identified. No significant degenerative change at either hip joint. Intramedullary rod within the LEFT femur is incompletely imaged. Soft tissues about the pelvis and hips are unremarkable. IMPRESSION: No acute findings.  No osseous fracture or dislocation seen. Electronically Signed   By: Franki Cabot M.D.   On: 03/26/2018 22:04    Orson Eva, DO  [facility] Pager [phone]  If 7PM-7AM, please contact night-coverage [email or web address] Password [email or web address] 03/28/2018, 4:51 PM   LOS: 2 days

## 2018-03-28 NOTE — Plan of Care (Signed)
  Problem: Acute Rehab PT Goals(only PT should resolve) Goal: Pt Will Go Supine/Side To Sit Outcome: Progressing Flowsheets (Taken 03/28/2018 1440) Pt will go Supine/Side to Sit: with moderate assist Goal: Patient Will Transfer Sit To/From Stand Outcome: Progressing Flowsheets (Taken 03/28/2018 1440) Patient will transfer sit to/from stand: with moderate assist Goal: Pt Will Transfer Bed To Chair/Chair To Bed Outcome: Progressing Flowsheets (Taken 03/28/2018 1440) Pt will Transfer Bed to Chair/Chair to Bed: with mod assist Goal: Pt Will Ambulate Outcome: Progressing Flowsheets (Taken 03/28/2018 1440) Pt will Ambulate: 15 feet; with moderate assist; with rolling walker   2:40 PM, 03/28/18 Lonell Grandchild, MPT Physical Therapist with [facility] [phone] office [phone] mobile phone

## 2018-03-28 NOTE — Discharge Summary (Signed)
Physician Discharge Summary  Toni Gomez:[id number] DOB: 1923/07/23 DOA: 03/26/2018  PCP: Anda Kraft, MD  Admit date: 03/26/2018 Discharge date: 03/30/2018  Admitted From: Home Disposition:  SNF  Recommendations for Outpatient Follow-up:  1. Follow up with PCP in 1-2 weeks 2. Please obtain BMP/CBC in one week    Discharge Condition: Stable CODE STATUS: FULL Diet recommendation: Heart Healthy   Brief/Interim Summary: 84 year old female with history of hypertension and anxiety presenting with a syncopal episode. The patient was sitting next to her nephew when she slumped over her chair and became unconscious in the afternoon of 03/26/2018. The patient denied any aura, dizziness, chest discomfort, shortness breath, or palpitations. EMS was activated, and the patient was noted to have oxygen saturation in the 50s. The patient was subsequently Ambu bag and subsequently woke up. During the right in the ambulance, the patient was awake and conversive, but was confused. The patient's mental status continued to improve such that by the time she had a work-up in the emergency department, she was awake and conversant. There was no tonic-clonic activity. There was no tongue biting or bowel or bladder incontinence. The patient's nephew states that the patient has been in her usual state of health prior to this episode. There is been no new changes in her medications. She denies taking any over-the-counter medicines except for occasional acetaminophen. There is not been any headaches, nausea, vomiting, diarrhea, abdominal pain, dysuria, hematuria. In the emergency department, the patient was afebrile hemodynamically stable. BMP and CBC were essentially unremarkable. CT the brain was negative. Chest x-ray was negative. EKG shows sinus rhythm with right bundle branch block.  Discharge Diagnoses:  Syncope -Echocardiogram--EF 65-70%, on WMA, mild AI,TR,MR; mild to mod  AS -cardiology consult appreciated-->no further workup -Orthostatic vital signs -EEG--results pending  Acute Ischemic Stroke -PT/OT evaluation-->SNF -Speech therapy eval--regular diet -CT brain--neg -MRI brain--sub cm acute infart of R-cerebellum; punctate subacute infarct L-frontal parietal lobe -Urinalysis negative for pyuria -MRA brain--neg for hemodynamically significant stenosis -Carotid Duplex--neg for hemodynamically significant stenosis -Echo---EF 65-70%, on WMA, mild AI,TR,MR; mild to mod AS -LDL--118 -HbA1C--5.3 -Antiplatelet--ASA 81+plavix x 21 days, then plavix alone starting 04/18/18  Acute encephalopathy, type unspecified -MRI brain--sub cm acute infart of R-cerebellum; punctate subacute infarct L-frontal parietal lobe -Urinalysis negative for pyuria -Serum B12--188-->since low normal-->start supplementation -Folic WJXB--1.4 -NWG--9.562 -ammonia--25  Rhabdomyolysis -CPK peaked [id number]>>>[id number]>>>296 -Continue IV fluids  Hyperlipidemia -start statin as CK is improved -start lipitor 20 mg daily  Lower extremity edema and pain -Venous duplex rule out DVT--neg  Essential hypertension -holding amlodipine to allow for permissive hypertension -restart after d.c  Elevated troponin -trend is flat -personally reviewed EKG--sinus, RBBB -no chest pain  CKD stage 3 -baseline creatinine 1.3-1.5 -serum creatinine 1.30 at time of d/c    Discharge Instructions   Allergies as of 03/29/2018   No Known Allergies     Medication List    STOP taking these medications   aspirin 81 MG tablet Replaced by:  aspirin 81 MG EC tablet     TAKE these medications   amLODipine 5 MG tablet Commonly known as:  NORVASC Take 0.5 tablets (2.5 mg total) by mouth daily. What changed:  medication strength   aspirin 81 MG EC tablet Take 1 tablet (81 mg total) by mouth daily. X 20 days--last day on 04/17/18 Replaces:  aspirin 81 MG tablet   atorvastatin 20 MG  tablet Commonly known as:  LIPITOR Take 1 tablet (20 mg total) by mouth daily at 6 PM.  clopidogrel 75 MG tablet Commonly known as:  PLAVIX Take 1 tablet (75 mg total) by mouth daily.   LORazepam 0.5 MG tablet Commonly known as:  ATIVAN Take 0.5 mg by mouth every 4 (four) hours as needed for anxiety.   torsemide 20 MG tablet Commonly known as:  DEMADEX Take 10-20 mg by mouth daily as needed (for fluid).      Contact information for after-discharge care    [facility] .   Service:  Skilled Nursing Contact information: [address] [place] [phone]             No Known Allergies  Consultations:  none   Procedures/Studies: Dg Chest 2 View  Result Date: 03/26/2018 CLINICAL DATA:  Again all respirations., altered mental status. EXAM: CHEST - 2 VIEW COMPARISON:  Chest x-ray dated 01/08/2016. FINDINGS: Given the supine patient positioning, lungs are clear. No pleural effusion or pneumothorax seen. Heart size and mediastinal contours are within normal limits. Osseous structures about the chest are unremarkable. IMPRESSION: No active cardiopulmonary disease. Electronically Signed   By: Franki Cabot M.D.   On: 03/26/2018 22:02   Ct Head Wo Contrast  Result Date: 03/26/2018 CLINICAL DATA:  Initial evaluation for acute altered mental status, unexplained. EXAM: CT HEAD WITHOUT CONTRAST TECHNIQUE: Contiguous axial images were obtained from the base of the skull through the vertex without intravenous contrast. COMPARISON:  None. FINDINGS: Brain: Generalized age related cerebral atrophy with mild chronic small vessel ischemic disease. No acute intracranial hemorrhage. No acute large vessel territory infarct. No mass lesion, midline shift or mass effect. No hydrocephalus. No extra-axial fluid collection. Vascular: No hyperdense vessel. Calcified atherosclerosis at the skull base. Skull: Scalp soft tissues and  calvarium within normal limits. Sinuses/Orbits: Globes and orbital soft tissues demonstrate no acute finding. Visualized paranasal sinuses and mastoid air cells are clear. Other: None. IMPRESSION: 1. No acute intracranial abnormality. 2. Generalized age-related cerebral atrophy with mild chronic small vessel ischemic disease. Electronically Signed   By: Jeannine Boga M.D.   On: 03/26/2018 21:56   Mr Jodene Nam Head Wo Contrast  Result Date: 03/28/2018 CLINICAL DATA:  Altered mental status for 2 days EXAM: MRI HEAD WITHOUT CONTRAST MRA HEAD WITHOUT CONTRAST TECHNIQUE: Multiplanar, multiecho pulse sequences of the brain and surrounding structures were obtained without intravenous contrast. Angiographic images of the head were obtained using MRA technique without contrast. COMPARISON:  Head CT from 2 days ago FINDINGS: MRI HEAD FINDINGS Brain: Subcentimeter acute infarct in the right upper cerebellum. There is a punctate focus of diffusion hyperintensity in the left frontal parietal white matter that is slightly dark on ADC map. Minimal small vessel ischemic gliosis in the cerebral white matter. There have been remote lacunar infarcts in the centrum semiovale. Mild for age cerebral volume loss. No hemorrhage, hydrocephalus, collection, or masslike finding. Vascular: Major flow voids are preserved. Skull and upper cervical spine: Normal marrow signal. Sinuses/Orbits: No acute finding.  Bilateral cataract resection Other: Progressively motion degraded study. MRA HEAD FINDINGS Symmetric carotid and vertebral arteries. Bilateral conical inferiorly directed supraclinoid ICA outpouching, likely infundibula. Bilateral P2 segment stenosis that is at least moderate, greater on the left. There is a moderate or advanced distal left M1 segment narrowing. No major branch occlusion. IMPRESSION: Brain MRI: 1. Subcentimeter acute infarct in the right cerebellum. 2. Punctate subacute infarct in the left frontal parietal white  matter. 3. Age normal brain volume and mild for age chronic microvascular  ischemia. Intracranial MRA: 1. No emergent finding. 2. At least moderate narrowing of the left M1 and bilateral P2 segments. Electronically Signed   By: Monte Fantasia M.D.   On: 03/28/2018 08:31   Mr Brain Wo Contrast  Result Date: 03/28/2018 CLINICAL DATA:  Altered mental status for 2 days EXAM: MRI HEAD WITHOUT CONTRAST MRA HEAD WITHOUT CONTRAST TECHNIQUE: Multiplanar, multiecho pulse sequences of the brain and surrounding structures were obtained without intravenous contrast. Angiographic images of the head were obtained using MRA technique without contrast. COMPARISON:  Head CT from 2 days ago FINDINGS: MRI HEAD FINDINGS Brain: Subcentimeter acute infarct in the right upper cerebellum. There is a punctate focus of diffusion hyperintensity in the left frontal parietal white matter that is slightly dark on ADC map. Minimal small vessel ischemic gliosis in the cerebral white matter. There have been remote lacunar infarcts in the centrum semiovale. Mild for age cerebral volume loss. No hemorrhage, hydrocephalus, collection, or masslike finding. Vascular: Major flow voids are preserved. Skull and upper cervical spine: Normal marrow signal. Sinuses/Orbits: No acute finding.  Bilateral cataract resection Other: Progressively motion degraded study. MRA HEAD FINDINGS Symmetric carotid and vertebral arteries. Bilateral conical inferiorly directed supraclinoid ICA outpouching, likely infundibula. Bilateral P2 segment stenosis that is at least moderate, greater on the left. There is a moderate or advanced distal left M1 segment narrowing. No major branch occlusion. IMPRESSION: Brain MRI: 1. Subcentimeter acute infarct in the right cerebellum. 2. Punctate subacute infarct in the left frontal parietal white matter. 3. Age normal brain volume and mild for age chronic microvascular ischemia. Intracranial MRA: 1. No emergent finding. 2. At least  moderate narrowing of the left M1 and bilateral P2 segments. Electronically Signed   By: Monte Fantasia M.D.   On: 03/28/2018 08:31   US Renal  Result Date: 03/28/2018 CLINICAL DATA:  Acute on chronic renal failure, history hypertension EXAM: RENAL / URINARY TRACT ULTRASOUND COMPLETE COMPARISON:  None FINDINGS: Right Kidney: Renal measurements: 8.0 x 4.8 x 5.1 cm = volume: 101.7 mL. Normal cortical thickness for age. Increased cortical echogenicity. Single complicated cyst at mid RIGHT kidney 3.1 x 2.9 x 3.0 cm containing a single thin partial septation and minimal scattered internal echogenicity. No definite hydronephrosis or shadowing calcification. Left Kidney: Renal measurements: 8.4 x 5.8 x 4.5 cm = volume: 114.3 mL. Increased cortical echogenicity. Probably normal cortical thickness for age. Large cyst at inferior pole 9.4 x 5.9 x 9.2 cm with minimal dependent debris. No septations or mural nodularity. Additional smaller cysts, largest 3.0 x 3.1 x 3.2 cm with simple features at mid LEFT kidney. Fever than 10 cysts. No definite hydronephrosis or shadowing calcification. Bladder: Appears normal for degree of bladder distention. IMPRESSION: Medical renal disease changes of both kidneys. Multiple at lateral renal cysts including a large simple cyst at the inferior pole of the RIGHT kidney 9.4 cm in greatest size. Minimally complicated cyst mid RIGHT kidney 3.1 cm greatest size containing a thin partial septation and minimal internal echogenicity. Electronically Signed   By: Lavonia Dana M.D.   On: 03/28/2018 11:02   US Carotid Bilateral  Result Date: 03/28/2018 CLINICAL DATA:  Syncopal episode.  History of hypertension. EXAM: BILATERAL CAROTID DUPLEX ULTRASOUND TECHNIQUE: Pearline Cables scale imaging, color Doppler and duplex ultrasound were performed of bilateral carotid and vertebral arteries in the neck. COMPARISON:  None. FINDINGS: Criteria: Quantification of carotid stenosis is based on velocity parameters  that correlate the residual internal carotid diameter with NASCET-based stenosis levels, using the  diameter of the distal internal carotid lumen as the denominator for stenosis measurement. The following velocity measurements were obtained: RIGHT ICA:  61/15 cm/sec CCA:  96/2 cm/sec SYSTOLIC ICA/CCA RATIO:  1.0 ECA:  50 cm/sec LEFT ICA:  69/19 cm/sec CCA:  95/28 cm/sec SYSTOLIC ICA/CCA RATIO:  1.1 ECA:  41 cm/sec RIGHT CAROTID ARTERY: There is mild tortuosity of the right common carotid artery (representative image 3). There is a minimal amount of mixed echogenic plaque within the distal aspect the right common carotid artery (image 11). There is a minimal amount of echogenic plaque within the right carotid bulb (images 14 and 16), extending to involve the origin and proximal aspects of the right internal carotid artery (image 24), not resulting in elevated peak systolic velocities within the interrogated course of the right internal carotid artery to suggest a hemodynamically significant stenosis. RIGHT VERTEBRAL ARTERY:  Antegrade flow LEFT CAROTID ARTERY: There is a minimal amount of echogenic plaque within the distal aspect the left common carotid artery (image 45). There is a minimal amount of mixed echogenic partially shadowing plaque within the left carotid bulb (image 50), extending to involve the origin and proximal aspects of the left internal carotid artery (image 58), not resulting in elevated peak systolic velocities within the interrogated course of the left internal carotid artery to suggest a hemodynamically significant stenosis. LEFT VERTEBRAL ARTERY:  Antegrade flow IMPRESSION: Minimal to moderate amount of bilateral atherosclerotic plaque, right greater than left, not resulting in a hemodynamically significant stenosis within either internal carotid artery. Electronically Signed   By: Sandi Mariscal M.D.   On: 03/28/2018 10:25   US Venous Img Lower Bilateral  Result Date: 03/28/2018 CLINICAL  DATA:  BILATERAL leg pain and edema EXAM: BILATERAL LOWER EXTREMITY VENOUS DOPPLER ULTRASOUND TECHNIQUE: Gray-scale sonography with graded compression, as well as color Doppler and duplex ultrasound were performed to evaluate the lower extremity deep venous systems from the level of the common femoral vein and including the common femoral, femoral, profunda femoral, popliteal and calf veins including the posterior tibial, peroneal and gastrocnemius veins when visible. The superficial great saphenous vein was also interrogated. Spectral Doppler was utilized to evaluate flow at rest and with distal augmentation maneuvers in the common femoral, femoral and popliteal veins. COMPARISON:  None FINDINGS: RIGHT LOWER EXTREMITY Common Femoral Vein: No evidence of thrombus. Normal compressibility, respiratory phasicity and response to augmentation. Saphenofemoral Junction: No evidence of thrombus. Normal compressibility and flow on color Doppler imaging. Profunda Femoral Vein: No evidence of thrombus. Normal compressibility and flow on color Doppler imaging. Femoral Vein: No evidence of thrombus. Normal compressibility, respiratory phasicity and response to augmentation. Popliteal Vein: No evidence of thrombus. Normal compressibility, respiratory phasicity and response to augmentation. Calf Veins: No evidence of thrombus. Normal compressibility and flow on color Doppler imaging. Superficial Great Saphenous Vein: No evidence of thrombus. Normal compressibility. Venous Reflux:  None. Other Findings:  None. LEFT LOWER EXTREMITY Common Femoral Vein: No evidence of thrombus. Normal compressibility, respiratory phasicity and response to augmentation. Saphenofemoral Junction: No evidence of thrombus. Normal compressibility and flow on color Doppler imaging. Profunda Femoral Vein: No evidence of thrombus. Normal compressibility and flow on color Doppler imaging. Femoral Vein: No evidence of thrombus. Normal compressibility,  respiratory phasicity and response to augmentation. Popliteal Vein: No evidence of thrombus. Normal compressibility, respiratory phasicity and response to augmentation. Calf Veins: No evidence of thrombus. Normal compressibility and flow on color Doppler imaging. Superficial Great Saphenous Vein: No evidence of thrombus. Normal compressibility. Venous  Reflux:  None. Other Findings:  None. IMPRESSION: No evidence of deep venous thrombosis in either lower extremity. Electronically Signed   By: Lavonia Dana M.D.   On: 03/28/2018 09:45   Dg Hips Bilat W Or Wo Pelvis 3-4 Views  Result Date: 03/26/2018 CLINICAL DATA:  Fall, altered mental status. EXAM: DG HIP (WITH OR WITHOUT PELVIS) 3-4V BILAT COMPARISON:  None. FINDINGS: Osseous alignment is normal. No fracture line or displaced fracture fragment identified. No significant degenerative change at either hip joint. Intramedullary rod within the LEFT femur is incompletely imaged. Soft tissues about the pelvis and hips are unremarkable. IMPRESSION: No acute findings.  No osseous fracture or dislocation seen. Electronically Signed   By: Franki Cabot M.D.   On: 03/26/2018 22:04        Discharge Exam: Vitals:   03/29/18 0606 03/29/18 0822  BP: (!) 167/67   Pulse: 76   Resp:    Temp: 98.7 F (37.1 C)   SpO2: 99% 98%   Vitals:   03/29/18 0202 03/29/18 0203 03/29/18 0606 03/29/18 0822  BP: (!) 176/61  (!) 167/67   Pulse: 79  76   Resp: 17     Temp:  98.1 F (36.7 C) 98.7 F (37.1 C)   TempSrc:  Oral Oral   SpO2: 100%  99% 98%  Weight:      Height:        General: Pt is alert, awake, not in acute distress Cardiovascular: RRR, S1/S2 +, no rubs, no gallops Respiratory: CTA bilaterally, no wheezing, no rhonchi Abdominal: Soft, NT, ND, bowel sounds + Extremities: trace LE edema, no cyanosis   The results of significant diagnostics from this hospitalization (including imaging, microbiology, ancillary and laboratory) are listed below for  reference.    Significant Diagnostic Studies: Dg Chest 2 View  Result Date: 03/26/2018 CLINICAL DATA:  Again all respirations., altered mental status. EXAM: CHEST - 2 VIEW COMPARISON:  Chest x-ray dated 01/08/2016. FINDINGS: Given the supine patient positioning, lungs are clear. No pleural effusion or pneumothorax seen. Heart size and mediastinal contours are within normal limits. Osseous structures about the chest are unremarkable. IMPRESSION: No active cardiopulmonary disease. Electronically Signed   By: Franki Cabot M.D.   On: 03/26/2018 22:02   Ct Head Wo Contrast  Result Date: 03/26/2018 CLINICAL DATA:  Initial evaluation for acute altered mental status, unexplained. EXAM: CT HEAD WITHOUT CONTRAST TECHNIQUE: Contiguous axial images were obtained from the base of the skull through the vertex without intravenous contrast. COMPARISON:  None. FINDINGS: Brain: Generalized age related cerebral atrophy with mild chronic small vessel ischemic disease. No acute intracranial hemorrhage. No acute large vessel territory infarct. No mass lesion, midline shift or mass effect. No hydrocephalus. No extra-axial fluid collection. Vascular: No hyperdense vessel. Calcified atherosclerosis at the skull base. Skull: Scalp soft tissues and calvarium within normal limits. Sinuses/Orbits: Globes and orbital soft tissues demonstrate no acute finding. Visualized paranasal sinuses and mastoid air cells are clear. Other: None. IMPRESSION: 1. No acute intracranial abnormality. 2. Generalized age-related cerebral atrophy with mild chronic small vessel ischemic disease. Electronically Signed   By: Jeannine Boga M.D.   On: 03/26/2018 21:56   Mr Jodene Nam Head Wo Contrast  Result Date: 03/28/2018 CLINICAL DATA:  Altered mental status for 2 days EXAM: MRI HEAD WITHOUT CONTRAST MRA HEAD WITHOUT CONTRAST TECHNIQUE: Multiplanar, multiecho pulse sequences of the brain and surrounding structures were obtained without intravenous  contrast. Angiographic images of the head were obtained using MRA technique without contrast.  COMPARISON:  Head CT from 2 days ago FINDINGS: MRI HEAD FINDINGS Brain: Subcentimeter acute infarct in the right upper cerebellum. There is a punctate focus of diffusion hyperintensity in the left frontal parietal white matter that is slightly dark on ADC map. Minimal small vessel ischemic gliosis in the cerebral white matter. There have been remote lacunar infarcts in the centrum semiovale. Mild for age cerebral volume loss. No hemorrhage, hydrocephalus, collection, or masslike finding. Vascular: Major flow voids are preserved. Skull and upper cervical spine: Normal marrow signal. Sinuses/Orbits: No acute finding.  Bilateral cataract resection Other: Progressively motion degraded study. MRA HEAD FINDINGS Symmetric carotid and vertebral arteries. Bilateral conical inferiorly directed supraclinoid ICA outpouching, likely infundibula. Bilateral P2 segment stenosis that is at least moderate, greater on the left. There is a moderate or advanced distal left M1 segment narrowing. No major branch occlusion. IMPRESSION: Brain MRI: 1. Subcentimeter acute infarct in the right cerebellum. 2. Punctate subacute infarct in the left frontal parietal white matter. 3. Age normal brain volume and mild for age chronic microvascular ischemia. Intracranial MRA: 1. No emergent finding. 2. At least moderate narrowing of the left M1 and bilateral P2 segments. Electronically Signed   By: Monte Fantasia M.D.   On: 03/28/2018 08:31   Mr Brain Wo Contrast  Result Date: 03/28/2018 CLINICAL DATA:  Altered mental status for 2 days EXAM: MRI HEAD WITHOUT CONTRAST MRA HEAD WITHOUT CONTRAST TECHNIQUE: Multiplanar, multiecho pulse sequences of the brain and surrounding structures were obtained without intravenous contrast. Angiographic images of the head were obtained using MRA technique without contrast. COMPARISON:  Head CT from 2 days ago FINDINGS:  MRI HEAD FINDINGS Brain: Subcentimeter acute infarct in the right upper cerebellum. There is a punctate focus of diffusion hyperintensity in the left frontal parietal white matter that is slightly dark on ADC map. Minimal small vessel ischemic gliosis in the cerebral white matter. There have been remote lacunar infarcts in the centrum semiovale. Mild for age cerebral volume loss. No hemorrhage, hydrocephalus, collection, or masslike finding. Vascular: Major flow voids are preserved. Skull and upper cervical spine: Normal marrow signal. Sinuses/Orbits: No acute finding.  Bilateral cataract resection Other: Progressively motion degraded study. MRA HEAD FINDINGS Symmetric carotid and vertebral arteries. Bilateral conical inferiorly directed supraclinoid ICA outpouching, likely infundibula. Bilateral P2 segment stenosis that is at least moderate, greater on the left. There is a moderate or advanced distal left M1 segment narrowing. No major branch occlusion. IMPRESSION: Brain MRI: 1. Subcentimeter acute infarct in the right cerebellum. 2. Punctate subacute infarct in the left frontal parietal white matter. 3. Age normal brain volume and mild for age chronic microvascular ischemia. Intracranial MRA: 1. No emergent finding. 2. At least moderate narrowing of the left M1 and bilateral P2 segments. Electronically Signed   By: Monte Fantasia M.D.   On: 03/28/2018 08:31   US Renal  Result Date: 03/28/2018 CLINICAL DATA:  Acute on chronic renal failure, history hypertension EXAM: RENAL / URINARY TRACT ULTRASOUND COMPLETE COMPARISON:  None FINDINGS: Right Kidney: Renal measurements: 8.0 x 4.8 x 5.1 cm = volume: 101.7 mL. Normal cortical thickness for age. Increased cortical echogenicity. Single complicated cyst at mid RIGHT kidney 3.1 x 2.9 x 3.0 cm containing a single thin partial septation and minimal scattered internal echogenicity. No definite hydronephrosis or shadowing calcification. Left Kidney: Renal measurements:  8.4 x 5.8 x 4.5 cm = volume: 114.3 mL. Increased cortical echogenicity. Probably normal cortical thickness for age. Large cyst at inferior pole 9.4 x 5.9  x 9.2 cm with minimal dependent debris. No septations or mural nodularity. Additional smaller cysts, largest 3.0 x 3.1 x 3.2 cm with simple features at mid LEFT kidney. Fever than 10 cysts. No definite hydronephrosis or shadowing calcification. Bladder: Appears normal for degree of bladder distention. IMPRESSION: Medical renal disease changes of both kidneys. Multiple at lateral renal cysts including a large simple cyst at the inferior pole of the RIGHT kidney 9.4 cm in greatest size. Minimally complicated cyst mid RIGHT kidney 3.1 cm greatest size containing a thin partial septation and minimal internal echogenicity. Electronically Signed   By: Lavonia Dana M.D.   On: 03/28/2018 11:02   US Carotid Bilateral  Result Date: 03/28/2018 CLINICAL DATA:  Syncopal episode.  History of hypertension. EXAM: BILATERAL CAROTID DUPLEX ULTRASOUND TECHNIQUE: Pearline Cables scale imaging, color Doppler and duplex ultrasound were performed of bilateral carotid and vertebral arteries in the neck. COMPARISON:  None. FINDINGS: Criteria: Quantification of carotid stenosis is based on velocity parameters that correlate the residual internal carotid diameter with NASCET-based stenosis levels, using the diameter of the distal internal carotid lumen as the denominator for stenosis measurement. The following velocity measurements were obtained: RIGHT ICA:  61/15 cm/sec CCA:  35/3 cm/sec SYSTOLIC ICA/CCA RATIO:  1.0 ECA:  50 cm/sec LEFT ICA:  69/19 cm/sec CCA:  29/92 cm/sec SYSTOLIC ICA/CCA RATIO:  1.1 ECA:  41 cm/sec RIGHT CAROTID ARTERY: There is mild tortuosity of the right common carotid artery (representative image 3). There is a minimal amount of mixed echogenic plaque within the distal aspect the right common carotid artery (image 11). There is a minimal amount of echogenic plaque within  the right carotid bulb (images 14 and 16), extending to involve the origin and proximal aspects of the right internal carotid artery (image 24), not resulting in elevated peak systolic velocities within the interrogated course of the right internal carotid artery to suggest a hemodynamically significant stenosis. RIGHT VERTEBRAL ARTERY:  Antegrade flow LEFT CAROTID ARTERY: There is a minimal amount of echogenic plaque within the distal aspect the left common carotid artery (image 45). There is a minimal amount of mixed echogenic partially shadowing plaque within the left carotid bulb (image 50), extending to involve the origin and proximal aspects of the left internal carotid artery (image 58), not resulting in elevated peak systolic velocities within the interrogated course of the left internal carotid artery to suggest a hemodynamically significant stenosis. LEFT VERTEBRAL ARTERY:  Antegrade flow IMPRESSION: Minimal to moderate amount of bilateral atherosclerotic plaque, right greater than left, not resulting in a hemodynamically significant stenosis within either internal carotid artery. Electronically Signed   By: Sandi Mariscal M.D.   On: 03/28/2018 10:25   US Venous Img Lower Bilateral  Result Date: 03/28/2018 CLINICAL DATA:  BILATERAL leg pain and edema EXAM: BILATERAL LOWER EXTREMITY VENOUS DOPPLER ULTRASOUND TECHNIQUE: Gray-scale sonography with graded compression, as well as color Doppler and duplex ultrasound were performed to evaluate the lower extremity deep venous systems from the level of the common femoral vein and including the common femoral, femoral, profunda femoral, popliteal and calf veins including the posterior tibial, peroneal and gastrocnemius veins when visible. The superficial great saphenous vein was also interrogated. Spectral Doppler was utilized to evaluate flow at rest and with distal augmentation maneuvers in the common femoral, femoral and popliteal veins. COMPARISON:  None  FINDINGS: RIGHT LOWER EXTREMITY Common Femoral Vein: No evidence of thrombus. Normal compressibility, respiratory phasicity and response to augmentation. Saphenofemoral Junction: No evidence of thrombus. Normal compressibility  and flow on color Doppler imaging. Profunda Femoral Vein: No evidence of thrombus. Normal compressibility and flow on color Doppler imaging. Femoral Vein: No evidence of thrombus. Normal compressibility, respiratory phasicity and response to augmentation. Popliteal Vein: No evidence of thrombus. Normal compressibility, respiratory phasicity and response to augmentation. Calf Veins: No evidence of thrombus. Normal compressibility and flow on color Doppler imaging. Superficial Great Saphenous Vein: No evidence of thrombus. Normal compressibility. Venous Reflux:  None. Other Findings:  None. LEFT LOWER EXTREMITY Common Femoral Vein: No evidence of thrombus. Normal compressibility, respiratory phasicity and response to augmentation. Saphenofemoral Junction: No evidence of thrombus. Normal compressibility and flow on color Doppler imaging. Profunda Femoral Vein: No evidence of thrombus. Normal compressibility and flow on color Doppler imaging. Femoral Vein: No evidence of thrombus. Normal compressibility, respiratory phasicity and response to augmentation. Popliteal Vein: No evidence of thrombus. Normal compressibility, respiratory phasicity and response to augmentation. Calf Veins: No evidence of thrombus. Normal compressibility and flow on color Doppler imaging. Superficial Great Saphenous Vein: No evidence of thrombus. Normal compressibility. Venous Reflux:  None. Other Findings:  None. IMPRESSION: No evidence of deep venous thrombosis in either lower extremity. Electronically Signed   By: Lavonia Dana M.D.   On: 03/28/2018 09:45   Dg Hips Bilat W Or Wo Pelvis 3-4 Views  Result Date: 03/26/2018 CLINICAL DATA:  Fall, altered mental status. EXAM: DG HIP (WITH OR WITHOUT PELVIS) 3-4V BILAT  COMPARISON:  None. FINDINGS: Osseous alignment is normal. No fracture line or displaced fracture fragment identified. No significant degenerative change at either hip joint. Intramedullary rod within the LEFT femur is incompletely imaged. Soft tissues about the pelvis and hips are unremarkable. IMPRESSION: No acute findings.  No osseous fracture or dislocation seen. Electronically Signed   By: Franki Cabot M.D.   On: 03/26/2018 22:04     Microbiology: No results found for this or any previous visit (from the past 240 hour(s)).   Labs: Basic Metabolic Panel: Recent Labs  Lab 03/26/18 2016 03/28/18 0512 03/29/18 0532  NA 138 139 141  K 3.7 3.7 4.2  CL 108 111 116*  CO2 21* 21* 20*  GLUCOSE 106* 82 84  BUN 23 22 22   CREATININE 1.33* 1.54* 1.30*  CALCIUM 9.8 9.1 9.3   Liver Function Tests: Recent Labs  Lab 03/26/18 2016  AST 65*  ALT 24  ALKPHOS 68  BILITOT 1.2  PROT 6.6  ALBUMIN 3.6   No results for input(s): LIPASE, AMYLASE in the last 168 hours. Recent Labs  Lab 03/28/18 0512  AMMONIA 25   CBC: Recent Labs  Lab 03/26/18 2016  WBC 9.2  HGB 13.4  HCT 42.7  MCV 83.7  PLT 293   Cardiac Enzymes: Recent Labs  Lab 03/26/18 2016 03/27/18 0611 03/28/18 0512 03/29/18 0532  CKTOTAL 3,077* 1,856* 598* 296*  TROPONINI 0.06* 0.06*  --   --    BNP: Invalid input(s): POCBNP CBG: Recent Labs  Lab 03/26/18 2033  GLUCAP 95    Time coordinating discharge:  36 minutes  Signed:  Orson Eva, DO [facility] Pager: [phone] 03/29/2018, 3:00 PM

## 2018-03-29 ENCOUNTER — Inpatient Hospital Stay (HOSPITAL_COMMUNITY)
Admit: 2018-03-29 | Discharge: 2018-03-29 | Disposition: A | Discharge status: Home / Self Care | Payer: Medicare HMO | Attending: Internal Medicine | Admitting: Internal Medicine

## 2018-03-29 LAB — BASIC METABOLIC PANEL
ANION GAP: 5 (ref 5–15)
BUN: 22 mg/dL (ref 8–23)
CO2: 20 mmol/L — ABNORMAL LOW (ref 22–32)
Calcium: 9.3 mg/dL (ref 8.9–10.3)
Chloride: 116 mmol/L — ABNORMAL HIGH (ref 98–111)
Creatinine, Ser: 1.3 mg/dL — ABNORMAL HIGH (ref 0.44–1.00)
GFR calc Af Amer: 40 mL/min — ABNORMAL LOW (ref >60)
GFR calc non Af Amer: 35 mL/min — ABNORMAL LOW (ref >60)
Glucose, Bld: 84 mg/dL (ref 70–99)
Potassium: 4.2 mmol/L (ref 3.5–5.1)
SODIUM: 141 mmol/L (ref 135–145)

## 2018-03-29 LAB — CK: CK TOTAL: 296 U/L — AB (ref 38–234)

## 2018-03-29 MED ORDER — AMLODIPINE BESYLATE 5 MG PO TABS: 2.5000 mg | ORAL_TABLET | Freq: Every day | ORAL | 0 refills | Status: DC

## 2018-03-29 MED ORDER — ATORVASTATIN CALCIUM 20 MG PO TABS: 20.0000 mg | ORAL_TABLET | Freq: Every day | ORAL | 1 refills | Status: DC

## 2018-03-29 MED ORDER — ATORVASTATIN CALCIUM 20 MG PO TABS
20.0000 mg | ORAL_TABLET | Freq: Every day | ORAL | Status: DC
  Administered 2018-03-29: 20 mg via ORAL
  Filled 2018-03-29: qty 1

## 2018-03-29 NOTE — Procedures (Signed)
ELECTROENCEPHALOGRAM REPORT   Patient: Toni Gomez       Room #: A301 EEG No. ID: [id number] Age: 84 y.o.        Sex: female Referring Physician: Tat Report Date:  03/29/2018        Interpreting Physician: Alexis Goodell  History: Toni Gomez is an 84 y.o. female with a history of syncope evaluated to rule out seizure  Medications:  ASA, Lipitor, Plavix  Conditions of Recording:  This is a 21 channel routine scalp EEG performed with bipolar and monopolar montages arranged in accordance to the international 10/20 system of electrode placement. One channel was dedicated to EKG recording.  The patient is in the awake and drowsy states.  Description:  The waking background activity consists of a low voltage, symmetrical, fairly well organized, 10 Hz alpha activity, seen from the parieto-occipital and posterior temporal regions.  Low voltage fast activity, poorly organized, is seen anteriorly and is at times superimposed on more posterior regions.  A mixture of theta and alpha rhythms are seen from the central and temporal regions. The patient drowses with slowing to irregular, low voltage theta and beta activity.   Stage II sleep is not obtained. No epileptiform activity is noted.   Hyperventilation and intermittent photic stimulation were not performed.   IMPRESSION: Normal electroencephalogram, awake and drowsy. There are no focal lateralizing or epileptiform features.   Alexis Goodell, MD Neurology [phone] 03/29/2018, 5:02 PM

## 2018-03-29 NOTE — Progress Notes (Signed)
EEG Completed; Results Pending. Dr Doy Mince notified.

## 2018-03-29 NOTE — Evaluation (Signed)
Occupational Therapy Evaluation Patient Details Name: Toni Gomez MRN: [id number] DOB: 1924/02/22 Today's Date: 03/29/2018    History of Present Illness  Toni Gomez is a 84 y.o. female with medical history significant of hypertension who was brought to the emergency department via EMS after being found sitting in her chair at home with agonal respirations.  EMS found her to be 56% on room air.  They gave her rescue breathing with AMBU bag and supplemental oxygen, she became responsive.  The patient is unable to remember what happened, but was able to response to all my questions properly.  She denies fever, headache, sore throat, wheezing, chest pain, palpitations, dizziness, diaphoresis, PND orthopnea.  She occasionally gets lower extremity edema.  She denies nausea, emesis, diarrhea, constipation, melena or hematochezia.  No dysuria or hematuria.  Denies polyuria, polydipsia or polyphagia.  She denies skin pruritus.  She stated that her appetite and sleep are good.   Clinical Impression   Pt received supine in bed, agreeable to OT evaluation. PTA pt independent in ADLs and functional mobility.  Pt performing seated ADLs with set-up this am, unable to complete standing due to weakness and balance deficits. Pt with BUE weakness, sensation and coordination are intact. Pt performing functional mobility tasks with min/mod assist today, increased time and cuing for safety required. Recommend SNF on discharge to improve safety and independence in ADL completion and functional mobility tasks. No further acute care needs at this time.     Follow Up Recommendations  SNF    Equipment Recommendations  None recommended by OT       Precautions / Restrictions Precautions Precautions: Fall Restrictions Weight Bearing Restrictions: No      Mobility Bed Mobility Overal bed mobility: Needs Assistance Bed Mobility: Supine to Sit     Supine to sit: Min guard     General bed mobility  comments: pt required increased time, using bed rails to pull up on  Transfers Overall transfer level: Needs assistance Equipment used: Rolling walker (2 wheeled) Transfers: Sit to/from [facility] Sit to Stand: Mod assist Stand pivot transfers: Min assist;Mod assist       General transfer comment: Pt able to use RW for standing and transfer, increased time required and cuing for safety with walker        ADL either performed or assessed with clinical judgement   ADL Overall ADL's : Needs assistance/impaired Eating/Feeding: Modified independent;Sitting   Grooming: Set up;Sitting Grooming Details (indicate cue type and reason): Pt unable to complete tasks in standing due to BLE weakness limiting standing tolerance and balance deficits             Lower Body Dressing: Supervision/safety;Sitting/lateral leans                 General ADL Comments: Pt requiring increased assistance for ADL completion due to generalized weakness, poor activity tolerance, and balance deficits     Vision Baseline Vision/History: Wears glasses Wears Glasses: Distance only Patient Visual Report: No change from baseline Vision Assessment?: Yes Eye Alignment: Within Functional Limits Ocular Range of Motion: Within Functional Limits Alignment/Gaze Preference: Within Defined Limits Tracking/Visual Pursuits: Able to track stimulus in all quads without difficulty Saccades: Within functional limits Convergence: Within functional limits Visual Fields: No apparent deficits            Pertinent Vitals/Pain Pain Assessment: 0-10 Pain Score: 5  Pain Location: headache Pain Descriptors / Indicators: Headache Pain Intervention(s): Limited activity within patient's tolerance;RN gave  pain meds during session     Hand Dominance Right   Extremity/Trunk Assessment Upper Extremity Assessment Upper Extremity Assessment: Generalized weakness(grossly 3+/5 throughout BUE)   Lower  Extremity Assessment Lower Extremity Assessment: Defer to PT evaluation   Cervical / Trunk Assessment Cervical / Trunk Assessment: Normal   Communication Communication Communication: No difficulties   Cognition Arousal/Alertness: Awake/alert Behavior During Therapy: WFL for tasks assessed/performed Overall Cognitive Status: Within Functional Limits for tasks assessed                                                Home Living Family/patient expects to be discharged to:: Private residence Living Arrangements: Alone Available Help at Discharge: Family(nephew) Type of Home: House Home Access: Level entry     Home Layout: One level     Bathroom Shower/Tub: Teacher, early years/pre: Standard     Home Equipment: Environmental consultant - 2 wheels;Cane - single point;Shower seat;Bedside commode;Wheelchair - manual          Prior Functioning/Environment Level of Independence: Independent with assistive device(s)        Comments: household ambulator with SPC, uses RW for longer distances. Independent in ADL completion        OT Problem List: Decreased strength;Decreased activity tolerance;Impaired balance (sitting and/or standing);Decreased safety awareness;Decreased knowledge of use of DME or AE;Impaired UE functional use      OT Treatment/Interventions:      OT Goals(Current goals can be found in the care plan section) Acute Rehab OT Goals Patient Stated Goal: return home after rehab  OT Frequency:      End of Session Equipment Utilized During Treatment: Gait belt;Rolling walker Nurse Communication: Mobility status  Activity Tolerance: Patient tolerated treatment well Patient left: in chair;with call bell/phone within reach  OT Visit Diagnosis: Muscle weakness (generalized) (M62.81)                Time: 6256-3893 OT Time Calculation (min): 37 min Charges:  OT General Charges $OT Visit: 1 Visit OT Evaluation $OT Eval Low Complexity: [facility], OTR/L  [phone] 03/29/2018, 8:17 AM

## 2018-03-30 ENCOUNTER — Inpatient Hospital Stay
Admission: RE | Admit: 2018-03-30 | Discharge: 2018-05-02 | Disposition: A | Discharge status: Other Acute Inpatient Hospital | Payer: Medicare HMO | Source: Ambulatory Visit | Attending: Internal Medicine | Admitting: Internal Medicine

## 2018-03-30 ENCOUNTER — Encounter: Payer: Self-pay | Admitting: Internal Medicine

## 2018-03-30 ENCOUNTER — Other Ambulatory Visit: Payer: Self-pay

## 2018-03-30 ENCOUNTER — Non-Acute Institutional Stay (SKILLED_NURSING_FACILITY): Payer: Medicare HMO | Admitting: Internal Medicine

## 2018-03-30 DIAGNOSIS — M6282 Rhabdomyolysis: Secondary | ICD-10-CM

## 2018-03-30 DIAGNOSIS — N179 Acute kidney failure, unspecified: Secondary | ICD-10-CM

## 2018-03-30 DIAGNOSIS — J069 Acute upper respiratory infection, unspecified: Secondary | ICD-10-CM | POA: Diagnosis not present

## 2018-03-30 DIAGNOSIS — I10 Essential (primary) hypertension: Secondary | ICD-10-CM

## 2018-03-30 DIAGNOSIS — N189 Chronic kidney disease, unspecified: Secondary | ICD-10-CM | POA: Diagnosis not present

## 2018-03-30 DIAGNOSIS — R0902 Hypoxemia: Secondary | ICD-10-CM | POA: Diagnosis not present

## 2018-03-30 DIAGNOSIS — Z8673 Personal history of transient ischemic attack (TIA), and cerebral infarction without residual deficits: Secondary | ICD-10-CM | POA: Diagnosis not present

## 2018-03-30 DIAGNOSIS — R262 Difficulty in walking, not elsewhere classified: Secondary | ICD-10-CM | POA: Diagnosis not present

## 2018-03-30 DIAGNOSIS — M6281 Muscle weakness (generalized): Secondary | ICD-10-CM | POA: Diagnosis not present

## 2018-03-30 DIAGNOSIS — R55 Syncope and collapse: Secondary | ICD-10-CM | POA: Diagnosis not present

## 2018-03-30 DIAGNOSIS — R4020 Unspecified coma: Secondary | ICD-10-CM | POA: Diagnosis not present

## 2018-03-30 DIAGNOSIS — R05 Cough: Secondary | ICD-10-CM | POA: Diagnosis not present

## 2018-03-30 DIAGNOSIS — R531 Weakness: Secondary | ICD-10-CM | POA: Diagnosis not present

## 2018-03-30 DIAGNOSIS — R52 Pain, unspecified: Secondary | ICD-10-CM | POA: Diagnosis not present

## 2018-03-30 DIAGNOSIS — Z7901 Long term (current) use of anticoagulants: Secondary | ICD-10-CM | POA: Diagnosis not present

## 2018-03-30 DIAGNOSIS — E785 Hyperlipidemia, unspecified: Secondary | ICD-10-CM | POA: Diagnosis not present

## 2018-03-30 DIAGNOSIS — R5381 Other malaise: Secondary | ICD-10-CM | POA: Diagnosis not present

## 2018-03-30 DIAGNOSIS — Z79899 Other long term (current) drug therapy: Secondary | ICD-10-CM | POA: Diagnosis not present

## 2018-03-30 DIAGNOSIS — F419 Anxiety disorder, unspecified: Secondary | ICD-10-CM | POA: Diagnosis not present

## 2018-03-30 DIAGNOSIS — J209 Acute bronchitis, unspecified: Secondary | ICD-10-CM | POA: Diagnosis not present

## 2018-03-30 DIAGNOSIS — R4182 Altered mental status, unspecified: Secondary | ICD-10-CM | POA: Diagnosis not present

## 2018-03-30 DIAGNOSIS — I639 Cerebral infarction, unspecified: Secondary | ICD-10-CM

## 2018-03-30 DIAGNOSIS — I129 Hypertensive chronic kidney disease with stage 1 through stage 4 chronic kidney disease, or unspecified chronic kidney disease: Secondary | ICD-10-CM | POA: Diagnosis not present

## 2018-03-30 DIAGNOSIS — R0989 Other specified symptoms and signs involving the circulatory and respiratory systems: Secondary | ICD-10-CM | POA: Diagnosis not present

## 2018-03-30 DIAGNOSIS — I4430 Unspecified atrioventricular block: Secondary | ICD-10-CM | POA: Diagnosis not present

## 2018-03-30 DIAGNOSIS — R2689 Other abnormalities of gait and mobility: Secondary | ICD-10-CM | POA: Diagnosis not present

## 2018-03-30 DIAGNOSIS — I440 Atrioventricular block, first degree: Secondary | ICD-10-CM | POA: Diagnosis not present

## 2018-03-30 LAB — BASIC METABOLIC PANEL
ANION GAP: 5 (ref 5–15)
BUN: 23 mg/dL (ref 8–23)
CHLORIDE: 116 mmol/L — AB (ref 98–111)
CO2: 21 mmol/L — ABNORMAL LOW (ref 22–32)
Calcium: 9.4 mg/dL (ref 8.9–10.3)
Creatinine, Ser: 1.35 mg/dL — ABNORMAL HIGH (ref 0.44–1.00)
GFR calc Af Amer: 38 mL/min — ABNORMAL LOW (ref >60)
GFR calc non Af Amer: 33 mL/min — ABNORMAL LOW (ref >60)
GLUCOSE: 81 mg/dL (ref 70–99)
POTASSIUM: 4.2 mmol/L (ref 3.5–5.1)
SODIUM: 142 mmol/L (ref 135–145)

## 2018-03-30 LAB — CK: CK TOTAL: 205 U/L (ref 38–234)

## 2018-03-30 MED ORDER — LORAZEPAM 0.5 MG PO TABS: 0.5000 mg | ORAL_TABLET | ORAL | 0 refills | Status: DC | PRN

## 2018-03-30 NOTE — Progress Notes (Signed)
.  This is an acute visit.  Level care skilled.  Facility is [facility].  Chief complaint acute visit status post hospitalization for CVA.  History of present illness.  Patient is a very pleasant 84 year old female here for rehab after sustaining a CVA.  She has a previous history of hypertension and anxiety and apparently had an acute change when she was visiting with family and slumped over in her chair and became temporarily unconscious.  Was noted to have oxygen saturation in the 50s when EMS arrived.  She subsequently did wake up after treatment with a Ambu bag- she became conversant but was somewhat confused.  However her mental status continued to improve by the time she was worked up in the ER she was awake conversant and lucid.  .  In the hospital her lab work was essentially unremarkable as well as the CT of the brain.  However MRI of the brain did show an infarct of the right cerebellum punctate subacute infarct left frontoparietal lobe.  MRA of the brain did not show hemodynamically significant stenosis carotid Dopplers were negative for significant stenosis her echo was 65 to 70%-.  LDL was mildly elevated at 118 hemoglobin A1c was satisfactory at 5.3.  Recommendation was to start aspirin 81 mg a day plus Plavix for 21 days and then continue with Plavix alone  Again her encephalopathy resolved urinalysis were negative B12 was borderline low and was suggested to possibly supplement.  Folic acid and TSH as well as ammonia were within normal limits.  She did apparently have rhabdomylolysis with a CPK peaking at over 1800 on discharge it was down in the lower 200s she did receive IV fluids. She was started on a statin when her CK improved she is now on Lipitor 20 mg a day.  She was on Norvasc low-dose this was held during her hospitalization to allow for permissive hypertension- this has been restarted on her discharge meds- blood pressure when she arrived in  the facility appear to be somewhat elevated with systolic running between 1 70-200 however once patient had been in facility for a while and appeared to be less anxious  did come down to a systolic of 782 diastolic was running about 90.  Of note she was seen by cardiology and it was thought her acute event was not cardiology related she did have an elevated troponin but the trend was flat EKG showed sinus rhythm with a right bundle branch block she did not have any chest pain   Currently she is lying in bed comfortably initially appeared a bit anxious after arriving in facility but appeared to be less anxious as time went on-and her blood pressure appeared to go down somewhat accordingly.  Currently she denies any headache dizziness chest pain-she appears quite motivated to get back home apparently she does live alone but has supportive care within close proximity   Past Medical History:  Diagnosis Date  . Hypertension     History reviewed. No pertinent surgical history.   reports that she has never smoked. She does not have any smokeless tobacco history on file. She reports that she does not drink alcohol or use drugs.  No Known Allergies       Family History  Problem Relation Age of Onset  . Hypertension     MEDICATIONS   amLODipine 5 MG tablet Commonly known as:  NORVASC Take 0.5 tablets (2.5 mg total) by mouth daily. What changed:  medication strength  aspirin 81 MG EC tablet Take 1 tablet (81 mg total) by mouth daily. X 20 days--last day on 04/17/18 Replaces:  aspirin 81 MG tablet   atorvastatin 20 MG tablet Commonly known as:  LIPITOR Take 1 tablet (20 mg total) by mouth daily at 6 PM.   clopidogrel 75 MG tablet Commonly known as:  PLAVIX Take 1 tablet (75 mg total) by mouth daily.   LORazepam 0.5 MG tablet Commonly known as:  ATIVAN Take 0.5 mg by mouth every 4 (four) hours as needed for anxiety.          Review of systems.  In general she  is not complaining any fever chills she is bright and alert.  Skin does not complain of rashes itching or diaphoresis.  Head ears eyes nose mouth and throat she has prescription lenses is not complaining of visual changes or sore throat or difficulty swallowing.  Her graph respiratory does not complain of shortness of breath or cough.  Cardiac denies chest pain has mild lower extremity edema.  GI is not complaining of abdominal pain nausea vomiting diarrhea or constipation.  GU does not complain of dysuria.  Musculoskeletal has some mild lower extremity weakness-does not complain of joint pain.  Neurologic has a history of some mild lower extremity weakness apparently this is baseline does not complain of any headache dizziness or syncope.  Psych does not complain of being depressed or anxious appears to be in good spirits.  Physical exam.  She is afebrile pulse is 60 respirations of 17 blood pressure initially systolically 1 44-010 with diastolic of 90 however over time the patient became somewhat less anxious systolic did go down to 272.  In general this is a very pleasant elderly female who does not appear to be in any distress she appears somewhat younger than her stated age.  Her skin is warm and dry.  Eyes she has prescription lenses visual acuity appears to be intact pupils appear to be reactive to light.  Oropharynx is clear mucous membranes moist tongue is midline with full range of motion.  Chest is clear to auscultation there is no labored breathing.  Heart is regular rate and rhythm without murmur gallop or rub she has mild lower extremity edema.  Abdomen is soft nontender with positive bowel sounds.  Musculoskeletal appears able to move all extremities x4 does have some lower extremity weakness although extent is difficult to fully ascertain since he is in bed grip strength is very strong bilaterally.  Neurologic appears grossly intact with some mild lower extremity  weakness although this apparently is somewhat chronic-her speech is clear cranial nerves appear to be in grossly intact.  Psych she is alert and oriented pleasant and appropriate.  Labs.  March 30, 2018.  Sodium 142 potassium 4.2 BUN 23 creatinine 1.35.  CK level-205.  March 28, 2018.  Lipid panel showed LDL was 118.  B12 was 188.  BNP was 455 on November 9  Liver function test within normal limits except direct bilirubin of 0.3 AST was 65.  WBC was 9.2 hemoglobin 13.4 platelets 293  Assessment and plan.  1.-  History of acute ischemic CVA MRI did show an infarct of the right cerebellum punctate infarct of left frontal parietal lobe- MRA was negative for significant stenosis-she is been started on aspirin 81 mg a day with Plavix for 21 days and then recommendation to discontinue Plavix alone she is also been started on a statin with an LDL noted of 118.  2.  Rhabdomylolysis her CK is trending down- that she was started on a statin she did receive IV fluids.  Continues to have some weakness but apparently has made progress.  She will benefit from PT and OT.  3.  Hyperlipidemia as noted above LDL was 118 and she has been started on Lipitor.  4.  Hypertension recommendation to allow some permissive hypertension again her systolic was fairly elevated when she first came to facility but this is trending down as noted above at this point will monitor.  She has been started on low-dose Norvasc.  5.  History of chronic kidney disease stage III creatinine was 1.5 on discharge which appears to be relatively her baseline this will need updating first laboratory data next week.  6.  History of B12 deficiency-her B12 level was low normal consider possibly starting B12 supplementation according to discharge summary she apparently did receive some B12 in the hospital although there is somewhat unclear- will await clarification.  7 history of anxiety continues on Ativan as needed  every 4 hours at this point will monitor.  8.  History of edema-per echo left ventricular ejection fraction was vigorous 65 to 70%- suggestion of grade 1 diastolic dysfunction-she is not on a routine diuretic at this point will monitor apparently has had an order for as needed torsemide in the past-at this point continue to monitor for any significant weight gain or edema  Clinically she appears to be stable she is looking forward to going home and getting stronger she appears motivated will have to monitor her blood pressure closely but it appears to be trending down.   [id number] note greater than 40 minutes spent assessing patient-reassessing patient-reviewing her chart and labs- discussing her status with her niece at bedside- coordinating and formulating a plan of care for numerous diagnoses- of note greater than 50% of time spent coordinating a plan of care with input as noted above

## 2018-03-30 NOTE — Progress Notes (Signed)
Report called to [facility], LPN at [facility].

## 2018-03-30 NOTE — Clinical Social Work Placement (Signed)
   CLINICAL SOCIAL WORK PLACEMENT  NOTE  Date:  03/30/2018  Patient Details  Name: Toni Gomez MRN: [id number] Date of Birth: 01-Oct-1923  Clinical Social Work is seeking post-discharge placement for this patient at the [place] level of care (*CSW will initial, date and re-position this form in  chart as items are completed):  Yes   Patient/family provided with [facility] Work Department's list of facilities offering this level of care within the geographic area requested by the patient (or if unable, by the patient's family).  Yes   Patient/family informed of their freedom to choose among providers that offer the needed level of care, that participate in Medicare, Medicaid or managed care program needed by the patient, have an available bed and are willing to accept the patient.  Yes   Patient/family informed of [facility]'s ownership interest in [facility] and [facility], as well as of the fact that they are under no obligation to receive care at these facilities.  PASRR submitted to EDS on 03/28/18     PASRR number received on 03/28/18     Existing PASRR number confirmed on       FL2 transmitted to all facilities in geographic area requested by pt/family on 03/28/18     FL2 transmitted to all facilities within larger geographic area on       Patient informed that his/her managed care company has contracts with or will negotiate with certain facilities, including the following:        Yes   Patient/family informed of bed offers received.  Patient chooses bed at [facility]     Physician recommends and patient chooses bed at      Patient to be transferred to [facility] on 03/30/18.  Patient to be transferred to facility by [facility] staff     Patient family notified on 03/30/18 of transfer.  Name of family member notified:  nephew at bedside.     PHYSICIAN       Additional Comment:  Facility notified.  Discharge clinicals sent. LCSW signing off.   _______________________________________________ Ihor Gully, LCSW 03/30/2018, 11:57 AM

## 2018-03-30 NOTE — Progress Notes (Signed)
Patient seen and examined this morning, has been awaiting insurance authorization for discharge. Has no complaints or issues. Nephew at bedside happy with care. CK has normalized and EEG was reportedly normal. She remains stable for discharge, which I have ordered.   Vance Gather, MD 03/30/2018 12:28 PM

## 2018-03-30 NOTE — Care Management Important Message (Signed)
Important Message  Patient Details  Name: Toni Gomez MRN: [id number] Date of Birth: [birth year]   Medicare Important Message Given:  Yes    Shelda Altes 03/30/2018, 11:07 AM

## 2018-03-30 NOTE — Plan of Care (Signed)
Adequate for discharge.

## 2018-03-30 NOTE — Telephone Encounter (Signed)
RX Fax for [facility]@ [phone]  

## 2018-03-31 ENCOUNTER — Non-Acute Institutional Stay (SKILLED_NURSING_FACILITY): Payer: Medicare HMO | Admitting: Internal Medicine

## 2018-03-31 ENCOUNTER — Encounter: Payer: Self-pay | Admitting: Internal Medicine

## 2018-03-31 DIAGNOSIS — M6282 Rhabdomyolysis: Secondary | ICD-10-CM | POA: Diagnosis not present

## 2018-03-31 DIAGNOSIS — I10 Essential (primary) hypertension: Secondary | ICD-10-CM | POA: Diagnosis not present

## 2018-03-31 DIAGNOSIS — I639 Cerebral infarction, unspecified: Secondary | ICD-10-CM | POA: Diagnosis not present

## 2018-03-31 DIAGNOSIS — R55 Syncope and collapse: Secondary | ICD-10-CM

## 2018-03-31 DIAGNOSIS — E785 Hyperlipidemia, unspecified: Secondary | ICD-10-CM | POA: Diagnosis not present

## 2018-03-31 NOTE — Progress Notes (Signed)
Provider:  Veleta Miners MD Location:    [facility] Room Number: 159/P Place of Service:  SNF (31)  PCP: Anda Kraft, MD Patient Care Team: Anda Kraft, MD as PCP - General (Endocrinology) Herminio Commons, MD as PCP - Cardiology (Cardiology)  Extended Emergency Contact Information Primary Emergency Contact: PRICE,[name] Address: [address]  [address] Home Phone: [phone] Relation: None Secondary Emergency Contact: Mitchell Mobile Phone: [phone] Relation: Niece  Code Status: Full Code Goals of Care: Advanced Directive information Advanced Directives 03/31/2018  Does Patient Have a Medical Advance Directive? Yes  Type of Advance Directive (No Data)  Does patient want to make changes to medical advance directive? No - Patient declined  Would patient like information on creating a medical advance directive? No - Patient declined      Chief Complaint  Patient presents with  . New Admit To SNF    New Admission Visit    HPI: Patient is a 84 y.o. female seen today for admission to SNF for therapy. She stayed in the hospital from 11/09-11/13 for Acute Ischemic Stroke  Patient has h/o Hypertension who came to ED when she was sitting next to her Nephew and talking when suddenly she slumped over her chair and became unconscious.  The EMS came and her saturation was in 74s.  Patient says she became awake in the hospital.  She does not remember anything else.  Per nephew sitting in the room there was no signs of any seizure activity.  In the hospital patient had a MRI done which showed subacute infarct in right cerebellum and left frontal parietal matter.  Her other work-up including 2D echo showed EF of 65% with mild to moderate AS.  Carotids were negative for any stenosis Patient is back to her baseline per her family.  She said that her only complaint is weakness in her legs and inability to stand.  She feels very unsteady she  also was complaining of constipation. Patient lives by herself and was independent in her ADL and IADL and was driving short distances.  She used walker or cane sometimes.  She has a very supportive family who lives nearby.   Past Medical History:  Diagnosis Date  . Hypertension    History reviewed. No pertinent surgical history.  reports that she has never smoked. She has never used smokeless tobacco. She reports that she does not drink alcohol or use drugs. Social History   Socioeconomic History  . Marital status: Widowed    Spouse name: Not on file  . Number of children: Not on file  . Years of education: Not on file  . Highest education level: Not on file  Occupational History  . Not on file  Social Needs  . Financial resource strain: Patient refused  . Food insecurity:    Worry: Patient refused    Inability: Patient refused  . Transportation needs:    Medical: Patient refused    Non-medical: Patient refused  Tobacco Use  . Smoking status: Never Smoker  . Smokeless tobacco: Never Used  Substance and Sexual Activity  . Alcohol use: No  . Drug use: No  . Sexual activity: Not Currently  Lifestyle  . Physical activity:    Days per week: Patient refused    Minutes per session: Patient refused  . Stress: Patient refused  Relationships  . Social connections:    Talks on phone: Patient refused  Gets together: Patient refused    Attends religious service: Patient refused    Active member of club or organization: Patient refused    Attends meetings of clubs or organizations: Patient refused    Relationship status: Patient refused  . Intimate partner violence:    Fear of current or ex partner: Patient refused    Emotionally abused: Patient refused    Physically abused: Patient refused    Forced sexual activity: Patient refused  Other Topics Concern  . Not on file  Social History Narrative  . Not on file    Functional Status Survey:    Family History  Problem  Relation Age of Onset  . Hypertension Other     Health Maintenance  Topic Date Due  . INFLUENZA VACCINE  04/30/2018 (Originally 12/16/2017)  . FOOT EXAM  04/30/2018 (Originally 07/16/1933)  . OPHTHALMOLOGY EXAM  04/30/2018 (Originally 07/16/1933)  . URINE MICROALBUMIN  04/30/2018 (Originally 07/16/1933)  . DEXA SCAN  04/30/2018 (Originally 07/15/1990)  . TETANUS/TDAP  04/30/2018 (Originally 07/15/1944)  . PNA vac Low Risk Adult (1 of 2 - PCV13) 04/30/2018 (Originally 07/15/1990)  . HEMOGLOBIN A1C  09/25/2018    No Known Allergies  Allergies as of 03/31/2018   No Known Allergies     Medication List        Accurate as of 03/31/18  9:16 AM. Always use your most recent med list.          amLODipine 5 MG tablet Commonly known as:  NORVASC Take 0.5 tablets (2.5 mg total) by mouth daily.   aspirin 81 MG EC tablet Take 1 tablet (81 mg total) by mouth daily. X 20 days--last day on 04/17/18   atorvastatin 20 MG tablet Commonly known as:  LIPITOR Take 1 tablet (20 mg total) by mouth daily at 6 PM.   clopidogrel 75 MG tablet Commonly known as:  PLAVIX Take 1 tablet (75 mg total) by mouth daily.   LORazepam 0.5 MG tablet Commonly known as:  ATIVAN Take 1 tablet (0.5 mg total) by mouth every 4 (four) hours as needed for anxiety.       Review of Systems  Gastrointestinal: Positive for constipation.  All other systems reviewed and are negative.   Vitals:   03/31/18 0909  BP: 128/72  Pulse: 69  Resp: 17  Temp: 97.6 F (36.4 C)  TempSrc: Oral   There is no height or weight on file to calculate BMI. Physical Exam  Constitutional: She is oriented to person, place, and time. She appears well-developed and well-nourished.  HENT:  Head: Normocephalic.  Mouth/Throat: Oropharynx is clear and moist.  Eyes: Pupils are equal, round, and reactive to light.  Neck: Neck supple.  Cardiovascular: Normal rate and regular rhythm.  No murmur heard. Pulmonary/Chest: Effort normal and  breath sounds normal. No stridor. No respiratory distress.  Abdominal: Soft. Bowel sounds are normal. She exhibits no distension. There is no tenderness. There is no guarding.  Neurological: She is alert and oriented to person, place, and time.  Mild weakness in Left LE  C/o Pain in Left shoulder  Skin: Skin is warm and dry.  Psychiatric: She has a normal mood and affect. Her behavior is normal. Thought content normal.    Labs reviewed: Basic Metabolic Panel: Recent Labs    03/28/18 0512 03/29/18 0532 03/30/18 0449  NA 139 141 142  K 3.7 4.2 4.2  CL 111 116* 116*  CO2 21* 20* 21*  GLUCOSE 82 84 81  BUN 22 22  23  CREATININE 1.54* 1.30* 1.35*  CALCIUM 9.1 9.3 9.4   Liver Function Tests: Recent Labs    03/26/18 2016  AST 65*  ALT 24  ALKPHOS 68  BILITOT 1.2  PROT 6.6  ALBUMIN 3.6   No results for input(s): LIPASE, AMYLASE in the last 8760 hours. Recent Labs    03/28/18 0512  AMMONIA 25   CBC: Recent Labs    03/26/18 2016  WBC 9.2  HGB 13.4  HCT 42.7  MCV 83.7  PLT 293   Cardiac Enzymes: Recent Labs    03/26/18 2016 03/27/18 0611 03/28/18 0512 03/29/18 0532 03/30/18 0449  CKTOTAL 3,077* 1,856* 598* 296* 205  TROPONINI 0.06* 0.06*  --   --   --    BNP: Invalid input(s): POCBNP Lab Results  Component Value Date   HGBA1C 5.3 03/27/2018   Lab Results  Component Value Date   TSH 3.619 03/28/2018   Lab Results  Component Value Date   VITAMINB12 188 03/28/2018   Lab Results  Component Value Date   FOLATE 6.0 03/28/2018   No results found for: IRON, TIBC, FERRITIN  Imaging and Procedures obtained prior to SNF admission: No results found.  Assessment/Plan Acute ischemic stroke  On Dual therapy and then Plavix after 3 weeks Other Work up was negative Start Therapy  Syncope and collapse Most likely due to Ischemic stroke At her Baseline now Cardiac W/U Was negative D/c Ativan  Non-traumatic rhabdomyolysis Resolved  Essential  hypertension BP control on Norvasc  Hyperlipidemia,  Started on Lipitor for sec prevention CKD stage 3 Creat near baseline Will Follow     Family/ staff Communication:   Labs/tests ordered:  Total time spent in this patient care encounter was 45 _ minutes; greater than 50% of the visit spent counseling patient, reviewing records , Labs and coordinating care for problems addressed at this encounter.

## 2018-04-04 ENCOUNTER — Other Ambulatory Visit (HOSPITAL_COMMUNITY)
Admission: RE | Admit: 2018-04-04 | Discharge: 2018-04-04 | Disposition: A | Discharge status: Home / Self Care | Payer: Medicare HMO | Source: Skilled Nursing Facility | Attending: Internal Medicine | Admitting: Internal Medicine

## 2018-04-04 DIAGNOSIS — I639 Cerebral infarction, unspecified: Secondary | ICD-10-CM | POA: Diagnosis not present

## 2018-04-04 LAB — CBC WITH DIFFERENTIAL/PLATELET
ABS IMMATURE GRANULOCYTES: 0.02 10*3/uL (ref 0.00–0.07)
BASOS PCT: 1 %
Basophils Absolute: 0 10*3/uL (ref 0.0–0.1)
Eosinophils Absolute: 0.3 10*3/uL (ref 0.0–0.5)
Eosinophils Relative: 6 %
HEMATOCRIT: 36.9 % (ref 36.0–46.0)
HEMOGLOBIN: 11.4 g/dL — AB (ref 12.0–15.0)
Immature Granulocytes: 1 %
LYMPHS ABS: 1.2 10*3/uL (ref 0.7–4.0)
LYMPHS PCT: 27 %
MCH: 26.3 pg (ref 26.0–34.0)
MCHC: 30.9 g/dL (ref 30.0–36.0)
MCV: 85 fL (ref 80.0–100.0)
MONO ABS: 0.4 10*3/uL (ref 0.1–1.0)
MONOS PCT: 9 %
NEUTROS ABS: 2.5 10*3/uL (ref 1.7–7.7)
Neutrophils Relative %: 56 %
Platelets: 307 10*3/uL (ref 150–400)
RBC: 4.34 MIL/uL (ref 3.87–5.11)
RDW: 15.8 % — ABNORMAL HIGH (ref 11.5–15.5)
WBC: 4.4 10*3/uL (ref 4.0–10.5)
nRBC: 0 % (ref 0.0–0.2)

## 2018-04-04 LAB — BASIC METABOLIC PANEL
Anion gap: 7 (ref 5–15)
BUN: 24 mg/dL — AB (ref 8–23)
CHLORIDE: 108 mmol/L (ref 98–111)
CO2: 25 mmol/L (ref 22–32)
Calcium: 9.5 mg/dL (ref 8.9–10.3)
Creatinine, Ser: 1.32 mg/dL — ABNORMAL HIGH (ref 0.44–1.00)
GFR calc Af Amer: 39 mL/min — ABNORMAL LOW (ref >60)
GFR calc non Af Amer: 34 mL/min — ABNORMAL LOW (ref >60)
GLUCOSE: 81 mg/dL (ref 70–99)
POTASSIUM: 4.1 mmol/L (ref 3.5–5.1)
Sodium: 140 mmol/L (ref 135–145)

## 2018-04-07 ENCOUNTER — Encounter (HOSPITAL_COMMUNITY)
Admission: RE | Admit: 2018-04-07 | Discharge: 2018-04-07 | Disposition: A | Discharge status: Home / Self Care | Payer: Medicare HMO | Source: Skilled Nursing Facility | Attending: *Deleted | Admitting: *Deleted

## 2018-04-07 LAB — CBC WITH DIFFERENTIAL/PLATELET
Abs Immature Granulocytes: 0.03 10*3/uL (ref 0.00–0.07)
BASOS ABS: 0 10*3/uL (ref 0.0–0.1)
Basophils Relative: 1 %
EOS ABS: 0.2 10*3/uL (ref 0.0–0.5)
EOS PCT: 4 %
HEMATOCRIT: 41.5 % (ref 36.0–46.0)
Hemoglobin: 12.5 g/dL (ref 12.0–15.0)
Immature Granulocytes: 1 %
LYMPHS ABS: 1.7 10*3/uL (ref 0.7–4.0)
Lymphocytes Relative: 31 %
MCH: 25.8 pg — ABNORMAL LOW (ref 26.0–34.0)
MCHC: 30.1 g/dL (ref 30.0–36.0)
MCV: 85.6 fL (ref 80.0–100.0)
Monocytes Absolute: 0.4 10*3/uL (ref 0.1–1.0)
Monocytes Relative: 7 %
NRBC: 0 % (ref 0.0–0.2)
Neutro Abs: 3.1 10*3/uL (ref 1.7–7.7)
Neutrophils Relative %: 56 %
Platelets: 330 10*3/uL (ref 150–400)
RBC: 4.85 MIL/uL (ref 3.87–5.11)
RDW: 16 % — AB (ref 11.5–15.5)
WBC: 5.4 10*3/uL (ref 4.0–10.5)

## 2018-04-07 LAB — COMPREHENSIVE METABOLIC PANEL
ALT: 16 U/L (ref 0–44)
ANION GAP: 6 (ref 5–15)
AST: 15 U/L (ref 15–41)
Albumin: 3.5 g/dL (ref 3.5–5.0)
Alkaline Phosphatase: 65 U/L (ref 38–126)
BILIRUBIN TOTAL: 0.5 mg/dL (ref 0.3–1.2)
BUN: 33 mg/dL — ABNORMAL HIGH (ref 8–23)
CO2: 25 mmol/L (ref 22–32)
Calcium: 10.1 mg/dL (ref 8.9–10.3)
Chloride: 109 mmol/L (ref 98–111)
Creatinine, Ser: 1.41 mg/dL — ABNORMAL HIGH (ref 0.44–1.00)
GFR calc Af Amer: 36 mL/min — ABNORMAL LOW (ref >60)
GFR, EST NON AFRICAN AMERICAN: 31 mL/min — AB (ref >60)
Glucose, Bld: 89 mg/dL (ref 70–99)
POTASSIUM: 4.8 mmol/L (ref 3.5–5.1)
Sodium: 140 mmol/L (ref 135–145)
TOTAL PROTEIN: 6.5 g/dL (ref 6.5–8.1)

## 2018-04-26 ENCOUNTER — Non-Acute Institutional Stay (SKILLED_NURSING_FACILITY): Payer: Medicare HMO | Admitting: Internal Medicine

## 2018-04-26 ENCOUNTER — Encounter: Payer: Self-pay | Admitting: Internal Medicine

## 2018-04-26 DIAGNOSIS — I10 Essential (primary) hypertension: Secondary | ICD-10-CM | POA: Diagnosis not present

## 2018-04-26 DIAGNOSIS — I639 Cerebral infarction, unspecified: Secondary | ICD-10-CM

## 2018-04-26 DIAGNOSIS — J069 Acute upper respiratory infection, unspecified: Secondary | ICD-10-CM | POA: Diagnosis not present

## 2018-04-26 NOTE — Progress Notes (Signed)
Location:    [facility] Room Number: 159/P Place of Service:  SNF [phone]) Provider:  Veleta Miners MD  Anda Kraft, MD  Patient Care Team: Anda Kraft, MD as PCP - General (Endocrinology) Herminio Commons, MD as PCP - Cardiology (Cardiology)  Extended Emergency Contact Information Primary Emergency Contact: PRICE,[name] Address: [address]  [place] Home Phone: [phone] Relation: None Secondary Emergency Contact: [place] Mobile Phone: [phone] Relation: Niece  Code Status:  Full Code Goals of care: Advanced Directive information Advanced Directives 04/26/2018  Does Patient Have a Medical Advance Directive? Yes  Type of Advance Directive (No Data)  Does patient want to make changes to medical advance directive? No - Patient declined  Would patient like information on creating a medical advance directive? No - Patient declined     Chief Complaint  Patient presents with  . Acute Visit    Cold symptoms    HPI:  Pt is a 84 y.o. female seen today for an acute visit for Productive cough and Feeling Weak. Patient is admitted to SNF for therapy  She stayed in the hospital from 11/09-11/13 for Acute Ischemic Stroke  Patient has h/o Hypertension who came to ED when she was sitting next to her Nephew and talking when suddenly she slumped over her chair and became unconscious.  The EMS came and her saturation was in 67s.  Patient says she became awake in the hospital.  She does not remember anything else.  Per nephew sitting in the room there was no signs of any seizure activity.  In the hospital patient had a MRI done which showed subacute infarct in right cerebellum and left frontal parietal matter.  Her other work-up including 2D echo showed EF of 65% with mild to moderate AS.  Carotids were negative for any stenosis  In the facility patient had done well with therapy and was walking 25 to 50 feet.  But past few days patient  has not been able to do therapy.  She says she feels weak has a cough with productive sputum.  Denies any fever or chills.  She says she feels short of breath after coughing. Past Medical History:  Diagnosis Date  . Hypertension    History reviewed. No pertinent surgical history.  No Known Allergies  Outpatient Encounter Medications as of 04/26/2018  Medication Sig  . amLODipine (NORVASC) 5 MG tablet Take 0.5 tablets (2.5 mg total) by mouth daily.  Marland Kitchen atorvastatin (LIPITOR) 20 MG tablet Take 1 tablet (20 mg total) by mouth daily at 6 PM.  . clopidogrel (PLAVIX) 75 MG tablet Take 1 tablet (75 mg total) by mouth daily.  . Loratadine (CLARITIN) 10 MG CAPS Take 10 mg by mouth daily.  . polyethylene glycol (MIRALAX / GLYCOLAX) packet Take 17 g by mouth daily as needed.  . [DISCONTINUED] aspirin EC 81 MG EC tablet Take 1 tablet (81 mg total) by mouth daily. X 20 days--last day on 04/17/18  . [DISCONTINUED] LORazepam (ATIVAN) 0.5 MG tablet Take 1 tablet (0.5 mg total) by mouth every 4 (four) hours as needed for anxiety.   No facility-administered encounter medications on file as of 04/26/2018.      Review of Systems  Constitutional: Positive for activity change. Negative for fever.  HENT: Positive for congestion.   Respiratory: Positive for cough and shortness of breath.   Cardiovascular: Negative.   Gastrointestinal: Negative.   Genitourinary: Negative.   Musculoskeletal:  Positive for myalgias.  Skin: Negative.   Neurological: Positive for weakness.  Psychiatric/Behavioral: Negative.      There is no immunization history on file for this patient. Pertinent  Health Maintenance Due  Topic Date Due  . INFLUENZA VACCINE  04/30/2018 (Originally 12/16/2017)  . FOOT EXAM  04/30/2018 (Originally 07/16/1933)  . OPHTHALMOLOGY EXAM  04/30/2018 (Originally 07/16/1933)  . URINE MICROALBUMIN  04/30/2018 (Originally 07/16/1933)  . DEXA SCAN  04/30/2018 (Originally 07/15/1990)  . PNA vac Low Risk  Adult (1 of 2 - PCV13) 04/30/2018 (Originally 07/15/1990)  . HEMOGLOBIN A1C  09/25/2018   No flowsheet data found. Functional Status Survey:    Vitals:   04/26/18 1051  BP: 122/61  Pulse: 81  Resp: 18  Temp: 98.6 F (37 C)  TempSrc: Oral   There is no height or weight on file to calculate BMI. Physical Exam  Constitutional: She is oriented to person, place, and time. She appears well-developed and well-nourished.  HENT:  Head: Normocephalic.  Mouth/Throat: Oropharynx is clear and moist.  Eyes: Pupils are equal, round, and reactive to light.  Neck: Neck supple.  Cardiovascular: Normal rate and regular rhythm.  No murmur heard. Pulmonary/Chest: Effort normal. No stridor. No respiratory distress.  Has Rales in Left Lower lungs Ocasional Expiratory Wheezing  Abdominal: Soft. Bowel sounds are normal. She exhibits no distension. There is no tenderness. There is no guarding.  Neurological: She is alert and oriented to person, place, and time.  Mild weakness in Left LE  C/o Pain in Left shoulder  Skin: Skin is warm and dry.  Psychiatric: She has a normal mood and affect. Her behavior is normal. Thought content normal.    Labs reviewed: Recent Labs    03/30/18 0449 04/04/18 0324 04/07/18 0700  NA 142 140 140  K 4.2 4.1 4.8  CL 116* 108 109  CO2 21* 25 25  GLUCOSE 81 81 89  BUN 23 24* 33*  CREATININE 1.35* 1.32* 1.41*  CALCIUM 9.4 9.5 10.1   Recent Labs    03/26/18 2016 04/07/18 0700  AST 65* 15  ALT 24 16  ALKPHOS 68 65  BILITOT 1.2 0.5  PROT 6.6 6.5  ALBUMIN 3.6 3.5   Recent Labs    03/26/18 2016 04/04/18 0324 04/07/18 0700  WBC 9.2 4.4 5.4  NEUTROABS  --  2.5 3.1  HGB 13.4 11.4* 12.5  HCT 42.7 36.9 41.5  MCV 83.7 85.0 85.6  PLT 293 307 330   Lab Results  Component Value Date   TSH 3.619 03/28/2018   Lab Results  Component Value Date   HGBA1C 5.3 03/27/2018   Lab Results  Component Value Date   CHOL 182 03/28/2018   HDL 41 03/28/2018    LDLCALC 118 (H) 03/28/2018   TRIG 113 03/28/2018   CHOLHDL 4.4 03/28/2018    Significant Diagnostic Results in last 30 days:  Mr Jodene Nam Head Wo Contrast  Result Date: 03/28/2018 CLINICAL DATA:  Altered mental status for 2 days EXAM: MRI HEAD WITHOUT CONTRAST MRA HEAD WITHOUT CONTRAST TECHNIQUE: Multiplanar, multiecho pulse sequences of the brain and surrounding structures were obtained without intravenous contrast. Angiographic images of the head were obtained using MRA technique without contrast. COMPARISON:  Head CT from 2 days ago FINDINGS: MRI HEAD FINDINGS Brain: Subcentimeter acute infarct in the right upper cerebellum. There is a punctate focus of diffusion hyperintensity in the left frontal parietal white matter that is slightly dark on ADC map. Minimal small vessel ischemic gliosis in the  cerebral white matter. There have been remote lacunar infarcts in the centrum semiovale. Mild for age cerebral volume loss. No hemorrhage, hydrocephalus, collection, or masslike finding. Vascular: Major flow voids are preserved. Skull and upper cervical spine: Normal marrow signal. Sinuses/Orbits: No acute finding.  Bilateral cataract resection Other: Progressively motion degraded study. MRA HEAD FINDINGS Symmetric carotid and vertebral arteries. Bilateral conical inferiorly directed supraclinoid ICA outpouching, likely infundibula. Bilateral P2 segment stenosis that is at least moderate, greater on the left. There is a moderate or advanced distal left M1 segment narrowing. No major branch occlusion. IMPRESSION: Brain MRI: 1. Subcentimeter acute infarct in the right cerebellum. 2. Punctate subacute infarct in the left frontal parietal white matter. 3. Age normal brain volume and mild for age chronic microvascular ischemia. Intracranial MRA: 1. No emergent finding. 2. At least moderate narrowing of the left M1 and bilateral P2 segments. Electronically Signed   By: Monte Fantasia M.D.   On: 03/28/2018 08:31   Mr  Brain Wo Contrast  Result Date: 03/28/2018 CLINICAL DATA:  Altered mental status for 2 days EXAM: MRI HEAD WITHOUT CONTRAST MRA HEAD WITHOUT CONTRAST TECHNIQUE: Multiplanar, multiecho pulse sequences of the brain and surrounding structures were obtained without intravenous contrast. Angiographic images of the head were obtained using MRA technique without contrast. COMPARISON:  Head CT from 2 days ago FINDINGS: MRI HEAD FINDINGS Brain: Subcentimeter acute infarct in the right upper cerebellum. There is a punctate focus of diffusion hyperintensity in the left frontal parietal white matter that is slightly dark on ADC map. Minimal small vessel ischemic gliosis in the cerebral white matter. There have been remote lacunar infarcts in the centrum semiovale. Mild for age cerebral volume loss. No hemorrhage, hydrocephalus, collection, or masslike finding. Vascular: Major flow voids are preserved. Skull and upper cervical spine: Normal marrow signal. Sinuses/Orbits: No acute finding.  Bilateral cataract resection Other: Progressively motion degraded study. MRA HEAD FINDINGS Symmetric carotid and vertebral arteries. Bilateral conical inferiorly directed supraclinoid ICA outpouching, likely infundibula. Bilateral P2 segment stenosis that is at least moderate, greater on the left. There is a moderate or advanced distal left M1 segment narrowing. No major branch occlusion. IMPRESSION: Brain MRI: 1. Subcentimeter acute infarct in the right cerebellum. 2. Punctate subacute infarct in the left frontal parietal white matter. 3. Age normal brain volume and mild for age chronic microvascular ischemia. Intracranial MRA: 1. No emergent finding. 2. At least moderate narrowing of the left M1 and bilateral P2 segments. Electronically Signed   By: Monte Fantasia M.D.   On: 03/28/2018 08:31   US Renal  Result Date: 03/28/2018 CLINICAL DATA:  Acute on chronic renal failure, history hypertension EXAM: RENAL / URINARY TRACT ULTRASOUND  COMPLETE COMPARISON:  None FINDINGS: Right Kidney: Renal measurements: 8.0 x 4.8 x 5.1 cm = volume: 101.7 mL. Normal cortical thickness for age. Increased cortical echogenicity. Single complicated cyst at mid RIGHT kidney 3.1 x 2.9 x 3.0 cm containing a single thin partial septation and minimal scattered internal echogenicity. No definite hydronephrosis or shadowing calcification. Left Kidney: Renal measurements: 8.4 x 5.8 x 4.5 cm = volume: 114.3 mL. Increased cortical echogenicity. Probably normal cortical thickness for age. Large cyst at inferior pole 9.4 x 5.9 x 9.2 cm with minimal dependent debris. No septations or mural nodularity. Additional smaller cysts, largest 3.0 x 3.1 x 3.2 cm with simple features at mid LEFT kidney. Fever than 10 cysts. No definite hydronephrosis or shadowing calcification. Bladder: Appears normal for degree of bladder distention. IMPRESSION: Medical renal  disease changes of both kidneys. Multiple at lateral renal cysts including a large simple cyst at the inferior pole of the RIGHT kidney 9.4 cm in greatest size. Minimally complicated cyst mid RIGHT kidney 3.1 cm greatest size containing a thin partial septation and minimal internal echogenicity. Electronically Signed   By: Lavonia Dana M.D.   On: 03/28/2018 11:02   US Carotid Bilateral  Result Date: 03/28/2018 CLINICAL DATA:  Syncopal episode.  History of hypertension. EXAM: BILATERAL CAROTID DUPLEX ULTRASOUND TECHNIQUE: Pearline Cables scale imaging, color Doppler and duplex ultrasound were performed of bilateral carotid and vertebral arteries in the neck. COMPARISON:  None. FINDINGS: Criteria: Quantification of carotid stenosis is based on velocity parameters that correlate the residual internal carotid diameter with NASCET-based stenosis levels, using the diameter of the distal internal carotid lumen as the denominator for stenosis measurement. The following velocity measurements were obtained: RIGHT ICA:  61/15 cm/sec CCA:  93/7 cm/sec  SYSTOLIC ICA/CCA RATIO:  1.0 ECA:  50 cm/sec LEFT ICA:  69/19 cm/sec CCA:  90/24 cm/sec SYSTOLIC ICA/CCA RATIO:  1.1 ECA:  41 cm/sec RIGHT CAROTID ARTERY: There is mild tortuosity of the right common carotid artery (representative image 3). There is a minimal amount of mixed echogenic plaque within the distal aspect the right common carotid artery (image 11). There is a minimal amount of echogenic plaque within the right carotid bulb (images 14 and 16), extending to involve the origin and proximal aspects of the right internal carotid artery (image 24), not resulting in elevated peak systolic velocities within the interrogated course of the right internal carotid artery to suggest a hemodynamically significant stenosis. RIGHT VERTEBRAL ARTERY:  Antegrade flow LEFT CAROTID ARTERY: There is a minimal amount of echogenic plaque within the distal aspect the left common carotid artery (image 45). There is a minimal amount of mixed echogenic partially shadowing plaque within the left carotid bulb (image 50), extending to involve the origin and proximal aspects of the left internal carotid artery (image 58), not resulting in elevated peak systolic velocities within the interrogated course of the left internal carotid artery to suggest a hemodynamically significant stenosis. LEFT VERTEBRAL ARTERY:  Antegrade flow IMPRESSION: Minimal to moderate amount of bilateral atherosclerotic plaque, right greater than left, not resulting in a hemodynamically significant stenosis within either internal carotid artery. Electronically Signed   By: Sandi Mariscal M.D.   On: 03/28/2018 10:25   US Venous Img Lower Bilateral  Result Date: 03/28/2018 CLINICAL DATA:  BILATERAL leg pain and edema EXAM: BILATERAL LOWER EXTREMITY VENOUS DOPPLER ULTRASOUND TECHNIQUE: Gray-scale sonography with graded compression, as well as color Doppler and duplex ultrasound were performed to evaluate the lower extremity deep venous systems from the level of the  common femoral vein and including the common femoral, femoral, profunda femoral, popliteal and calf veins including the posterior tibial, peroneal and gastrocnemius veins when visible. The superficial great saphenous vein was also interrogated. Spectral Doppler was utilized to evaluate flow at rest and with distal augmentation maneuvers in the common femoral, femoral and popliteal veins. COMPARISON:  None FINDINGS: RIGHT LOWER EXTREMITY Common Femoral Vein: No evidence of thrombus. Normal compressibility, respiratory phasicity and response to augmentation. Saphenofemoral Junction: No evidence of thrombus. Normal compressibility and flow on color Doppler imaging. Profunda Femoral Vein: No evidence of thrombus. Normal compressibility and flow on color Doppler imaging. Femoral Vein: No evidence of thrombus. Normal compressibility, respiratory phasicity and response to augmentation. Popliteal Vein: No evidence of thrombus. Normal compressibility, respiratory phasicity and response to augmentation. Calf  Veins: No evidence of thrombus. Normal compressibility and flow on color Doppler imaging. Superficial Great Saphenous Vein: No evidence of thrombus. Normal compressibility. Venous Reflux:  None. Other Findings:  None. LEFT LOWER EXTREMITY Common Femoral Vein: No evidence of thrombus. Normal compressibility, respiratory phasicity and response to augmentation. Saphenofemoral Junction: No evidence of thrombus. Normal compressibility and flow on color Doppler imaging. Profunda Femoral Vein: No evidence of thrombus. Normal compressibility and flow on color Doppler imaging. Femoral Vein: No evidence of thrombus. Normal compressibility, respiratory phasicity and response to augmentation. Popliteal Vein: No evidence of thrombus. Normal compressibility, respiratory phasicity and response to augmentation. Calf Veins: No evidence of thrombus. Normal compressibility and flow on color Doppler imaging. Superficial Great Saphenous Vein:  No evidence of thrombus. Normal compressibility. Venous Reflux:  None. Other Findings:  None. IMPRESSION: No evidence of deep venous thrombosis in either lower extremity. Electronically Signed   By: Lavonia Dana M.D.   On: 03/28/2018 09:45    Assessment/Plan  Upper Respiratory Infection with Bronchitis Will start Her On Azithromycin Also oN low dose of Prednisone for few Days Start her on Robitussin Acute ischemic stroke  On  Plavix  Other Work up was negative Continue Therapy Non-traumatic rhabdomyolysis Resolved  Essential hypertension BP control on Norvasc  Hyperlipidemia,   on Lipitor for sec prevention CKD stage 3 Creat near baseline     Family/ staff Communication:   Labs/tests ordered:  Repeat Labs

## 2018-04-27 ENCOUNTER — Encounter (HOSPITAL_COMMUNITY)
Admission: RE | Admit: 2018-04-27 | Discharge: 2018-04-27 | Disposition: A | Discharge status: Home / Self Care | Payer: Medicare HMO | Source: Skilled Nursing Facility | Attending: Internal Medicine | Admitting: Internal Medicine

## 2018-04-27 DIAGNOSIS — I639 Cerebral infarction, unspecified: Secondary | ICD-10-CM | POA: Insufficient documentation

## 2018-04-27 DIAGNOSIS — N189 Chronic kidney disease, unspecified: Secondary | ICD-10-CM | POA: Diagnosis not present

## 2018-04-27 DIAGNOSIS — R262 Difficulty in walking, not elsewhere classified: Secondary | ICD-10-CM | POA: Diagnosis not present

## 2018-04-27 DIAGNOSIS — M6282 Rhabdomyolysis: Secondary | ICD-10-CM | POA: Diagnosis not present

## 2018-04-27 DIAGNOSIS — R2689 Other abnormalities of gait and mobility: Secondary | ICD-10-CM | POA: Insufficient documentation

## 2018-04-27 DIAGNOSIS — M6281 Muscle weakness (generalized): Secondary | ICD-10-CM | POA: Insufficient documentation

## 2018-04-27 LAB — CBC WITH DIFFERENTIAL/PLATELET
Abs Immature Granulocytes: 0.02 K/uL (ref 0.00–0.07)
Basophils Absolute: 0 K/uL (ref 0.0–0.1)
Basophils Relative: 1 %
Eosinophils Absolute: 0 K/uL (ref 0.0–0.5)
Eosinophils Relative: 1 %
HCT: 42 % (ref 36.0–46.0)
Hemoglobin: 12.6 g/dL (ref 12.0–15.0)
Immature Granulocytes: 0 %
Lymphocytes Relative: 23 %
Lymphs Abs: 1.1 K/uL (ref 0.7–4.0)
MCH: 25.9 pg — ABNORMAL LOW (ref 26.0–34.0)
MCHC: 30 g/dL (ref 30.0–36.0)
MCV: 86.2 fL (ref 80.0–100.0)
Monocytes Absolute: 0.3 K/uL (ref 0.1–1.0)
Monocytes Relative: 6 %
Neutro Abs: 3.3 K/uL (ref 1.7–7.7)
Neutrophils Relative %: 69 %
Platelets: 152 K/uL (ref 150–400)
RBC: 4.87 MIL/uL (ref 3.87–5.11)
RDW: 15.4 % (ref 11.5–15.5)
WBC: 4.7 K/uL (ref 4.0–10.5)
nRBC: 0 % (ref 0.0–0.2)

## 2018-04-27 LAB — COMPREHENSIVE METABOLIC PANEL WITH GFR
ALT: 14 U/L (ref 0–44)
AST: 17 U/L (ref 15–41)
Albumin: 3.6 g/dL (ref 3.5–5.0)
Alkaline Phosphatase: 78 U/L (ref 38–126)
Anion gap: 8 (ref 5–15)
BUN: 32 mg/dL — ABNORMAL HIGH (ref 8–23)
CO2: 23 mmol/L (ref 22–32)
Calcium: 10.1 mg/dL (ref 8.9–10.3)
Chloride: 108 mmol/L (ref 98–111)
Creatinine, Ser: 1.58 mg/dL — ABNORMAL HIGH (ref 0.44–1.00)
GFR calc Af Amer: 33 mL/min — ABNORMAL LOW (ref >60)
GFR calc non Af Amer: 28 mL/min — ABNORMAL LOW (ref >60)
Glucose, Bld: 108 mg/dL — ABNORMAL HIGH (ref 70–99)
Potassium: 5.3 mmol/L — ABNORMAL HIGH (ref 3.5–5.1)
Sodium: 139 mmol/L (ref 135–145)
Total Bilirubin: 0.5 mg/dL (ref 0.3–1.2)
Total Protein: 6.8 g/dL (ref 6.5–8.1)

## 2018-04-28 ENCOUNTER — Encounter (HOSPITAL_COMMUNITY)
Admission: RE | Admit: 2018-04-28 | Discharge: 2018-04-28 | Disposition: A | Discharge status: Home / Self Care | Payer: Medicare HMO | Source: Skilled Nursing Facility | Attending: Internal Medicine | Admitting: Internal Medicine

## 2018-04-28 DIAGNOSIS — M6281 Muscle weakness (generalized): Secondary | ICD-10-CM | POA: Insufficient documentation

## 2018-04-28 DIAGNOSIS — R262 Difficulty in walking, not elsewhere classified: Secondary | ICD-10-CM | POA: Insufficient documentation

## 2018-04-28 DIAGNOSIS — I639 Cerebral infarction, unspecified: Secondary | ICD-10-CM | POA: Insufficient documentation

## 2018-04-28 DIAGNOSIS — N189 Chronic kidney disease, unspecified: Secondary | ICD-10-CM | POA: Insufficient documentation

## 2018-04-28 DIAGNOSIS — E785 Hyperlipidemia, unspecified: Secondary | ICD-10-CM | POA: Insufficient documentation

## 2018-04-28 DIAGNOSIS — M6282 Rhabdomyolysis: Secondary | ICD-10-CM | POA: Insufficient documentation

## 2018-04-28 DIAGNOSIS — R2689 Other abnormalities of gait and mobility: Secondary | ICD-10-CM | POA: Insufficient documentation

## 2018-04-28 LAB — BASIC METABOLIC PANEL
Anion gap: 7 (ref 5–15)
BUN: 34 mg/dL — AB (ref 8–23)
CALCIUM: 10.1 mg/dL (ref 8.9–10.3)
CO2: 25 mmol/L (ref 22–32)
Chloride: 108 mmol/L (ref 98–111)
Creatinine, Ser: 1.73 mg/dL — ABNORMAL HIGH (ref 0.44–1.00)
GFR calc Af Amer: 29 mL/min — ABNORMAL LOW (ref >60)
GFR calc non Af Amer: 25 mL/min — ABNORMAL LOW (ref >60)
GLUCOSE: 110 mg/dL — AB (ref 70–99)
Potassium: 5 mmol/L (ref 3.5–5.1)
Sodium: 140 mmol/L (ref 135–145)

## 2018-04-29 ENCOUNTER — Non-Acute Institutional Stay (SKILLED_NURSING_FACILITY): Payer: Medicare HMO | Admitting: Internal Medicine

## 2018-04-29 ENCOUNTER — Other Ambulatory Visit: Payer: Self-pay

## 2018-04-29 ENCOUNTER — Encounter: Payer: Self-pay | Admitting: Internal Medicine

## 2018-04-29 DIAGNOSIS — N189 Chronic kidney disease, unspecified: Secondary | ICD-10-CM

## 2018-04-29 DIAGNOSIS — N179 Acute kidney failure, unspecified: Secondary | ICD-10-CM | POA: Diagnosis not present

## 2018-04-29 DIAGNOSIS — I639 Cerebral infarction, unspecified: Secondary | ICD-10-CM | POA: Diagnosis not present

## 2018-04-29 DIAGNOSIS — J069 Acute upper respiratory infection, unspecified: Secondary | ICD-10-CM

## 2018-04-29 DIAGNOSIS — I10 Essential (primary) hypertension: Secondary | ICD-10-CM | POA: Diagnosis not present

## 2018-05-01 ENCOUNTER — Encounter: Payer: Self-pay | Admitting: Internal Medicine

## 2018-05-01 NOTE — Progress Notes (Signed)
This is a discharge note.  Level of care skilled.  Facility is [facility].  Chief complaint discharge note.  History of present illness.  Patient is a pleasant 84 year old female seen today for discharge from facility early next week.  She was here for short-term rehab after hospitalization from November 9-2 November 13 for an acute ischemic stroke.  MRI showed a subacute infarct in the right cerebellum and left frontal parietal matter.  2D echo showed an ejection fraction of 65% with mild to moderate aortic stenosis carotids were negative for any stenosis  She was seen earlier this week for cold type symptoms and feeling weak she says she is feeling much better- she has been on azithromycin also low-dose prednisone and this appears to be largely resolving.  Regards to her CVA she does continue on Plavix and has gained strength she will need continued PT and OT she has very supportive family including a nephew who lives very close by and supportive neighbors who are also quite involved.  Her blood pressures generally appear to be fairly well controlled although it is mildly elevated at 150/86 today I see previous readings 136/74-129/64 I do see one reading earlier today in the 160s but this does not appear to be consistent  Currently she is sitting in her wheelchair comfortably and has no complaints continues to be in good spirits says she is feeling better   Past Medical History:  Diagnosis Date  . Hypertension    History reviewed. No pertinent surgical history.  No Known Allergies    MEDICATIONS     Medication Sig  . amLODipine (NORVASC) 5 MG tablet Take 0.5 tablets (2.5 mg total) by mouth daily.  Marland Kitchen atorvastatin (LIPITOR) 20 MG tablet Take 1 tablet (20 mg total) by mouth daily at 6 PM.  . clopidogrel (PLAVIX) 75 MG tablet Take 1 tablet (75 mg total) by mouth daily.  . Loratadine (CLARITIN) 10 MG CAPS Take 10 mg by mouth daily.  . polyethylene glycol (MIRALAX /  GLYCOLAX) packet Take 17 g by mouth daily as needed.  . [DISCONTINUED] aspirin EC 81 MG EC tablet Take 1 tablet (81 mg total) by mouth daily. X 20 days--last day on 04/17/18  . [DISCONTINUED] LORazepam (ATIVAN) 0.5 MG tablet Take 1 tablet (0.5 mg total) by mouth every 4 (four) hours as needed for anxiety.   No facility-administered encounter medications on file as of 04/26/2018.      Review of systems.  In general she is not complaining of fever chills says she feels stronger.  Skin does not complain of rashes or itching.  Head ears eyes nose mouth and throat does not complain of visual changes or sore throat.  Respiratory says her cough is better does not complain of shortness of breath.  Cardiac is not complaining of chest pain or increased edema.  GI is not complaining of abdominal pain nausea vomiting diarrhea or constipation.  GU is not complaining of dysuria.  Musculoskeletal does not complain of joint pain feels she is getting stronger.  Neurologic does have some mild left-sided weakness but this appears to be gradually improving does not complain of dizziness headache or numbness.  And psych continues to be in good spirits does not complain of being depressed or anxious     Physical exam.  Temperature is 98.6 pulse 74 respirations 18 blood pressure taken manually 150/80- previous readings as noted above.  In general this is a pleasant elderly female in no distress sitting comfortably in  her wheelchair.  Her skin is warm and dry.  Eyes visual acuity appears to be intact sclera and conjunctive are clear she has prescription lenses.  Oropharynx is clear mucous membranes moist.  Chest is clear to auscultation there is no labored breathing.  Heart is regular rate and rhythm without murmur gallop or rub she has mild lower extremity edema appears to be baseline.  Abdomen is soft nontender with positive bowel sounds.  Musculoskeletal moves all extremities x4 she  is using a walker with therapy-she has some mild left-sided weakness In upper and lower extremities.  Neurologic as noted above her speech is clear has some mild left-sided weakness.  Psych she is alert and oriented very pleasant and appropriate.  Labs.  April 28, 2018.  Sodium 140 potassium 5 BUN 34 creatinine 1.73.  WBC 4.7 hemoglobin 12.6 platelets 152.   April 27, 2018.  Liver function test within normal limits.  Assessment plan.  1.  History of suspected upper respiratory infection this appears resolving with the azithromycin and prednisone which she is completing she says she feels much better O2 saturation is in the 90s on room air continue to monitor.  2.  History of ischemic CVA- she continues on Plavix as well as a statin-- this appears stable she does continue to have some mild left-sided weakness will need continued PT and OT as well as home health support-she does have very supportive neighbors and a very supportive nephew who lives nearby.  3.  History of hypertension blood pressure is mildly elevated today she is on Norvasc generally pressures run it appears more in the 832-549  systolic area at this point will monitor -- would be hesitant to be real aggressive here since she is about to go home and would like to avoid any hypotension which could lead to falls.  4.  History of chronic kidney disease stage III this appears relatively stable--baseline creatinine recently appears to be in the mid ones it was 1.73 yesterday- actually earlier this week potassium was mildly elevated at 5.3 but repeat labs showed it had normalized at 5.0- will have this updated before discharge clinically she appears to be doing well.  5.  History of hyperlipidemia she is on Lipitor since her stay here is been quite short will defer to primary care provider for follow-up.    [id number] note greater than 30 minutes spent on this discharge summary greater than 50% of time spent  coordinating a plan of care for numerous diagnoses   .

## 2018-05-02 ENCOUNTER — Non-Acute Institutional Stay (SKILLED_NURSING_FACILITY): Payer: Medicare HMO | Admitting: Internal Medicine

## 2018-05-02 ENCOUNTER — Inpatient Hospital Stay
Admission: RE | Admit: 2018-05-02 | Discharge: 2018-05-07 | Disposition: A | Discharge status: Home / Self Care | Payer: Medicare HMO | Source: Ambulatory Visit | Attending: Internal Medicine | Admitting: Internal Medicine

## 2018-05-02 ENCOUNTER — Emergency Department (HOSPITAL_COMMUNITY)
Admission: EM | Admit: 2018-05-02 | Discharge: 2018-05-02 | Disposition: A | Discharge status: Skilled Nursing Facility | Payer: Medicare HMO | Attending: Emergency Medicine | Admitting: Emergency Medicine

## 2018-05-02 ENCOUNTER — Encounter (HOSPITAL_COMMUNITY): Payer: Self-pay | Admitting: Emergency Medicine

## 2018-05-02 ENCOUNTER — Encounter: Payer: Self-pay | Admitting: Internal Medicine

## 2018-05-02 ENCOUNTER — Other Ambulatory Visit (HOSPITAL_COMMUNITY)
Admission: RE | Admit: 2018-05-02 | Discharge: 2018-05-02 | Disposition: A | Discharge status: Home / Self Care | Payer: Medicare HMO | Source: Skilled Nursing Facility | Attending: *Deleted | Admitting: *Deleted

## 2018-05-02 ENCOUNTER — Emergency Department (HOSPITAL_COMMUNITY): Payer: Medicare HMO

## 2018-05-02 DIAGNOSIS — R059 Cough, unspecified: Secondary | ICD-10-CM

## 2018-05-02 DIAGNOSIS — J069 Acute upper respiratory infection, unspecified: Secondary | ICD-10-CM

## 2018-05-02 DIAGNOSIS — Z7901 Long term (current) use of anticoagulants: Secondary | ICD-10-CM | POA: Insufficient documentation

## 2018-05-02 DIAGNOSIS — R5381 Other malaise: Secondary | ICD-10-CM | POA: Diagnosis not present

## 2018-05-02 DIAGNOSIS — Z8673 Personal history of transient ischemic attack (TIA), and cerebral infarction without residual deficits: Secondary | ICD-10-CM | POA: Insufficient documentation

## 2018-05-02 DIAGNOSIS — R05 Cough: Secondary | ICD-10-CM | POA: Diagnosis not present

## 2018-05-02 DIAGNOSIS — R0989 Other specified symptoms and signs involving the circulatory and respiratory systems: Secondary | ICD-10-CM

## 2018-05-02 DIAGNOSIS — Z79899 Other long term (current) drug therapy: Secondary | ICD-10-CM | POA: Insufficient documentation

## 2018-05-02 DIAGNOSIS — I10 Essential (primary) hypertension: Secondary | ICD-10-CM | POA: Diagnosis not present

## 2018-05-02 DIAGNOSIS — R531 Weakness: Secondary | ICD-10-CM | POA: Diagnosis not present

## 2018-05-02 HISTORY — DX: Disorder of kidney and ureter, unspecified: N28.9

## 2018-05-02 HISTORY — DX: Cerebral infarction, unspecified: I63.9

## 2018-05-02 HISTORY — DX: Rhabdomyolysis: M62.82

## 2018-05-02 LAB — INFLUENZA PANEL BY PCR (TYPE A & B)
Influenza A By PCR: NEGATIVE
Influenza B By PCR: NEGATIVE

## 2018-05-02 LAB — BASIC METABOLIC PANEL
Anion gap: 8 (ref 5–15)
Anion gap: 6 (ref 5–15)
BUN: 35 mg/dL — ABNORMAL HIGH (ref 8–23)
BUN: 36 mg/dL — ABNORMAL HIGH (ref 8–23)
CO2: 25 mmol/L (ref 22–32)
CO2: 20 mmol/L — ABNORMAL LOW (ref 22–32)
Calcium: 9.8 mg/dL (ref 8.9–10.3)
Calcium: 8.8 mg/dL — ABNORMAL LOW (ref 8.9–10.3)
Chloride: 107 mmol/L (ref 98–111)
Chloride: 109 mmol/L (ref 98–111)
Creatinine, Ser: 1.34 mg/dL — ABNORMAL HIGH (ref 0.44–1.00)
Creatinine, Ser: 1.6 mg/dL — ABNORMAL HIGH (ref 0.44–1.00)
GFR calc Af Amer: 40 mL/min — ABNORMAL LOW (ref >60)
GFR calc Af Amer: 32 mL/min — ABNORMAL LOW (ref >60)
GFR calc non Af Amer: 34 mL/min — ABNORMAL LOW (ref >60)
GFR, EST NON AFRICAN AMERICAN: 28 mL/min — AB (ref >60)
GLUCOSE: 104 mg/dL — AB (ref 70–99)
Glucose, Bld: 84 mg/dL (ref 70–99)
Potassium: 4.3 mmol/L (ref 3.5–5.1)
Potassium: 4.1 mmol/L (ref 3.5–5.1)
Sodium: 140 mmol/L (ref 135–145)
Sodium: 135 mmol/L (ref 135–145)

## 2018-05-02 LAB — CBC WITH DIFFERENTIAL/PLATELET
Abs Immature Granulocytes: 0.05 10*3/uL (ref 0.00–0.07)
Basophils Absolute: 0 10*3/uL (ref 0.0–0.1)
Basophils Relative: 0 %
EOS ABS: 0.6 10*3/uL — AB (ref 0.0–0.5)
Eosinophils Relative: 7 %
HCT: 42.3 % (ref 36.0–46.0)
Hemoglobin: 12.6 g/dL (ref 12.0–15.0)
Immature Granulocytes: 1 %
Lymphocytes Relative: 5 %
Lymphs Abs: 0.4 10*3/uL — ABNORMAL LOW (ref 0.7–4.0)
MCH: 25.6 pg — ABNORMAL LOW (ref 26.0–34.0)
MCHC: 29.8 g/dL — ABNORMAL LOW (ref 30.0–36.0)
MCV: 85.8 fL (ref 80.0–100.0)
Monocytes Absolute: 0.4 10*3/uL (ref 0.1–1.0)
Monocytes Relative: 5 %
Neutro Abs: 7 10*3/uL (ref 1.7–7.7)
Neutrophils Relative %: 82 %
Platelets: 265 10*3/uL (ref 150–400)
RBC: 4.93 MIL/uL (ref 3.87–5.11)
RDW: 15.7 % — AB (ref 11.5–15.5)
WBC: 8.5 10*3/uL (ref 4.0–10.5)
nRBC: 0 % (ref 0.0–0.2)

## 2018-05-02 LAB — CBC
HCT: 35 % — ABNORMAL LOW (ref 36.0–46.0)
Hemoglobin: 10.8 g/dL — ABNORMAL LOW (ref 12.0–15.0)
MCH: 25.7 pg — ABNORMAL LOW (ref 26.0–34.0)
MCHC: 30.9 g/dL (ref 30.0–36.0)
MCV: 83.3 fL (ref 80.0–100.0)
PLATELETS: 297 10*3/uL (ref 150–400)
RBC: 4.2 MIL/uL (ref 3.87–5.11)
RDW: 15.6 % — ABNORMAL HIGH (ref 11.5–15.5)
WBC: 11.6 10*3/uL — ABNORMAL HIGH (ref 4.0–10.5)
nRBC: 0 % (ref 0.0–0.2)

## 2018-05-02 MED ORDER — SODIUM CHLORIDE 0.9 % IV BOLUS (SEPSIS)
500.0000 mL | Freq: Once | INTRAVENOUS | Status: AC
  Administered 2018-05-02: 500 mL via INTRAVENOUS

## 2018-05-02 MED ORDER — SODIUM CHLORIDE 0.9 % IV SOLN
1000.0000 mL | INTRAVENOUS | Status: DC
  Administered 2018-05-02: 1000 mL via INTRAVENOUS

## 2018-05-02 NOTE — Discharge Instructions (Addendum)
Continue your current medications, drink plenty of fluids, follow up with your doctor at the nursing facility

## 2018-05-02 NOTE — Progress Notes (Addendum)
Location:    [facility] Room Number: 159/P Place of Service:  SNF [id number]) Provider:  Patty Sermons, MD  Patient Care Team: Anda Kraft, MD as PCP - General (Endocrinology) Herminio Commons, MD as PCP - Cardiology (Cardiology)  Extended Emergency Contact Information Primary Emergency Contact: [name],[name] Address: [address]  [address] Home Phone: [phone] Relation: None Secondary Emergency Contact: [place] Mobile Phone: [phone] Relation: Niece  Code Status:  Full Code Goals of care: Advanced Directive information Advanced Directives 05/02/2018  Does Patient Have a Medical Advance Directive? Yes  Type of Advance Directive (No Data)  Does patient want to make changes to medical advance directive? No - Patient declined  Would patient like information on creating a medical advance directive? No - Patient declined     Chief Complaint  Patient presents with  . Acute Visit    Cough, congestion, and horse  Concerns for recurrent URI  HPI:  Pt is a 84 y.o. female seen today for an acute visit for for the above issues.  Patient was slated for discharge tomorrow but she appears to have a had a recurrence of her upper respiratory infection.  She has been here for rehab after hospitalization in November for an acute ischemic stroke MRI showed a subacute infarct in the right cerebellum and left frontal parietal matter.  Her other medical issues include chronic kidney disease with most recent creatinine 1.73-as well as hypertension on Norvasc-and a history of nontraumatic rhabdomylolysis which appears to have resolved  She has done well in regards to this still has some lower extremity weakness she was to go home tomorrow.   However it appears she has had a recurrence of her upper respiratory infection.  She was seen last week for this and appeared to respond to a course of azithromycin as well as prednisone and  Robitussin.  When I saw her Friday she was doing all right-however apparently yesterday she started to feel worse developed an increased cough with congestion and hoarseness and today says she feels pretty rotten  She is afebrile vital signs appear to be stable but she is significantly congested and does not appear to be feeling well -- this is significant change from when I saw her on Friday.  She also has significant diffuse congestion.        Past Medical History:  Diagnosis Date  . Hypertension    History reviewed. No pertinent surgical history.  No Known Allergies  Outpatient Encounter Medications as of 05/02/2018  Medication Sig  . amLODipine (NORVASC) 5 MG tablet Take 0.5 tablets (2.5 mg total) by mouth daily.  Marland Kitchen atorvastatin (LIPITOR) 20 MG tablet Take 1 tablet (20 mg total) by mouth daily at 6 PM.  . clopidogrel (PLAVIX) 75 MG tablet Take 1 tablet (75 mg total) by mouth daily.  Marland Kitchen dextromethorphan (DELSYM) 30 MG/5ML liquid Take 5 mLs by mouth every 6 (six) hours as needed for cough.   . Loratadine (CLARITIN) 10 MG CAPS Take 10 mg by mouth daily.  . polyethylene glycol (MIRALAX / GLYCOLAX) packet Take 17 g by mouth daily as needed.  . [DISCONTINUED] azithromycin (ZITHROMAX) 500 MG tablet Take 250 mg by mouth daily.  . [DISCONTINUED] predniSONE (DELTASONE) 10 MG tablet Take 30 mg by mouth daily with breakfast. For 5 days from 04/26/2018-04/30/2018   No facility-administered encounter medications on file as of 05/02/2018.     Review of  Systems   In general she says she feels weak and sick does not complain of fever   Skin does not complain of rashes itching or diaphoresis.  Head ears eyes nose mouth and throat does complain of a sore throat does not complain of visual changes.-Does have significant hoarseness   Respiratory does complain of a cough productive of green mucus- does report significant congestion Is not complaining of acute shortness of breath but does  not appear to be feeling well.  Cardiac is not complaining of chest pain she appears to have relatively baseline lower extremity edema.  GI does not complain of nausea vomiting diarrhea or constipation at this time or abdominal pain.  GU is not complaining of dysuria.  Musculoskeletal is complaining of feeling weak and washed out does not complain of joint pain.  Neurologic does complain of weakness does not really complain of headache or dizziness.  And psych does not complain of being depressed or anxious but does appear to be weaker and more anxious today than when I saw her Friday because she is not feeling well   There is no immunization history on file for this patient. Pertinent  Health Maintenance Due  Topic Date Due  . INFLUENZA VACCINE  06/02/2018 (Originally 12/16/2017)  . FOOT EXAM  06/02/2018 (Originally 07/16/1933)  . OPHTHALMOLOGY EXAM  06/02/2018 (Originally 07/16/1933)  . URINE MICROALBUMIN  06/02/2018 (Originally 07/16/1933)  . DEXA SCAN  06/02/2018 (Originally 07/15/1990)  . PNA vac Low Risk Adult (1 of 2 - PCV13) 06/02/2018 (Originally 07/15/1990)  . HEMOGLOBIN A1C  09/25/2018   No flowsheet data found. Functional Status Survey:    Vitals:   05/02/18 1231  BP: (!) 141/70  Pulse: 81  Resp: 19  Temp: 98.2 F (36.8 C)  TempSrc: Oral  SpO2: 95%  Manual blood pressure was roughly comparable at 140/ over 62  Physical Exam   In general this is a fairly well-developed elderly female in no acute distress but appears to be feeling ill  Her skin is warm and dry she is not diaphoretic.  Eyes visual acuity appears to be intact sclera and conjunctive are clear possibly small amount of watery drainage.  Oropharynx is clear mucous membranes moist.  Chest she has diffuse rhonchi and small amount of wheezing this is pretty much all lung fields there is no labored breathing.  Heart is regular rate and rhythm somewhat distant heart sounds without murmur gallop or rub she  appears to have her baseline venous stasis edema lower extremities.  Abdomen is soft does not appear to be tender there are positive bowel sounds.  Musculoskeletal moves all extremities x4 with some mild left-sided weakness which has been baseline apparently since her CVA.  Neurologic as noted above her speech is clear she does have some mild left upper and lower extremity weakness.  She is alert.  Psych she is grossly alert and oriented pleasant appropriate but says she feels sick appears anxious about this  Labs reviewed: Recent Labs    04/07/18 0700 04/27/18 0701 04/28/18 0722  NA 140 139 140  K 4.8 5.3* 5.0  CL 109 108 108  CO2 25 23 25   GLUCOSE 89 108* 110*  BUN 33* 32* 34*  CREATININE 1.41* 1.58* 1.73*  CALCIUM 10.1 10.1 10.1   Recent Labs    03/26/18 2016 04/07/18 0700 04/27/18 0701  AST 65* 15 17  ALT 24 16 14   ALKPHOS 68 65 78  BILITOT 1.2 0.5 0.5  PROT 6.6 6.5  6.8  ALBUMIN 3.6 3.5 3.6   Recent Labs    04/04/18 0324 04/07/18 0700 04/27/18 0701  WBC 4.4 5.4 4.7  NEUTROABS 2.5 3.1 3.3  HGB 11.4* 12.5 12.6  HCT 36.9 41.5 42.0  MCV 85.0 85.6 86.2  PLT 307 330 152   Lab Results  Component Value Date   TSH 3.619 03/28/2018   Lab Results  Component Value Date   HGBA1C 5.3 03/27/2018   Lab Results  Component Value Date   CHOL 182 03/28/2018   HDL 41 03/28/2018   LDLCALC 118 (H) 03/28/2018   TRIG 113 03/28/2018   CHOLHDL 4.4 03/28/2018    Significant Diagnostic Results in last 30 days:  No results found.  Assessment/Plan  #1 history of URI suspect reoccurrence- she appears to have had a relapse-she has significant cough and congestion today has had a decline in status since I saw her on Friday- will order restarting a prednisone 30 mg daily for 5 days-also will start IM Rocephin daily for 3 days.  Will order duo nebs as needed every 6 hours for 3 days.  As well as Robitussin 5 cc every 6 hours routine for 3 days and then as needed.  Will  order a stat chest x-ray PA and lateral.  We have also ordered lab work including a CBC with differential and a metabolic panel  She will need close monitoring with vital signs and pulse ox every shift for now.    In regards to possible discharge we would really like to keep her in the facility through later in the week to keep an eye on her clinical status --she has had a significant change since Friday and the recurrence of URI is concerning.  I did discuss her with her nephew as well as was patient and she would really like to be monitored in the facility as well--- I did discuss this with social worker and they will discuss this with the insurance company.  [id number] note greater than 35 minutes spent assessing patient reviewing her chart-formulating coordinating a plan of care and discussing her status with nursing staff as well as with her nephew at bedside and with social services. Of note greater than 50% of time spent coordinating plan of care with input as noted above   Addendum- this afternoon patient did spike a temperature 101.5- this was discussed with Dr. Arvid Right and will increase her Rocephin to 2 g tonight.  Also  to monitor vital signs closely every 4 hours.  I did reassess her and she appeared to be at her baseline still has some congestion says she felt weak but did not really complain of shortness of breath or any pain.  However this evening approximately 6 PM while nursing staff was administering her meds she apparently became somnolent and somewhat unresponsive.  I did assess her and at first had difficulty waking her up she continued to be non responsive and there was concern for an acute process. And EMS was called  However patient eventually did open her eyes and gradually became more responsive-by the time EMS arrived shortly thereafter she was actually talking and appeared to be at her baseline weak but appropriate in what t she said-- neurologically appeared  to be grossly intact  Secondary to periodic unresponsiveness- as well as fever with concerns for declining clinical status- will send her to the ER for expedient evaluation-

## 2018-05-02 NOTE — ED Provider Notes (Signed)
[facility] EMERGENCY DEPARTMENT Provider Note   CSN: [id number] Arrival date & time: 05/02/18  1841     History   Chief Complaint Chief Complaint  Patient presents with  . Weakness    HPI Toni Gomez is a 84 y.o. female.  HPI Patient presents to the emergency room for evaluation of sore throat hoarseness and cough.  Patient states hersymptoms started around a week ago.  She has been coughing bringing up mucus.  Her throat is also sore in the last day or so she has lost her voice.  He has some generalized malaise and weakness.  Patient denies any fevers.  No chest pain.  No shortness of breath.  No vomiting or diarrhea. Past Medical History:  Diagnosis Date  . Hypertension   . Renal disorder    chronic kidney disease  . Rhabdomyolysis   . Stroke [facility])     Patient Active Problem List   Diagnosis Date Noted  . Hyperlipidemia 03/31/2018  . Acute ischemic stroke ([place]) 03/28/2018  . Acute on chronic renal failure ([place])   . Fall   . Stroke (cerebrum) ([place])   . Abnormal EKG 03/27/2018  . Syncope and collapse 03/27/2018  . Acute encephalopathy 03/27/2018  . Altered mental status 03/26/2018  . Rhabdomyolysis 03/26/2018  . Hypertension 03/26/2018  . Elevated troponin 03/26/2018    History reviewed. No pertinent surgical history.   OB History   No obstetric history on file.      Home Medications    Prior to Admission medications   Medication Sig Start Date End Date Taking? Authorizing Provider  amLODipine (NORVASC) 5 MG tablet Take 0.5 tablets (2.5 mg total) by mouth daily. 03/29/18   Orson Eva, MD  atorvastatin (LIPITOR) 20 MG tablet Take 1 tablet (20 mg total) by mouth daily at 6 PM. 03/29/18   Tat, Shanon Brow, MD  clopidogrel (PLAVIX) 75 MG tablet Take 1 tablet (75 mg total) by mouth daily. 03/29/18   Orson Eva, MD  dextromethorphan (DELSYM) 30 MG/5ML liquid Take 5 mLs by mouth every 6 (six) hours as needed for cough.     [provider]  Loratadine  (CLARITIN) 10 MG CAPS Take 10 mg by mouth daily.    [provider]  polyethylene glycol (MIRALAX / GLYCOLAX) packet Take 17 g by mouth daily as needed.    [provider]    Family History Family History  Problem Relation Age of Onset  . Hypertension Other     Social History Social History   Tobacco Use  . Smoking status: Never Smoker  . Smokeless tobacco: Never Used  Substance Use Topics  . Alcohol use: No  . Drug use: No     Allergies   Patient has no known allergies.   Review of Systems Review of Systems  All other systems reviewed and are negative.    Physical Exam Updated Vital Signs BP (!) 111/49 (BP Location: Left Arm)   Pulse 70   Temp 99.8 F (37.7 C) (Oral)   Resp 18   Ht 1.549 m (5\' 1" )   Wt 72.6 kg   SpO2 98%   BMI 30.23 kg/m   Physical Exam Vitals signs and nursing note reviewed.  Constitutional:      General: She is not in acute distress.    Appearance: She is well-developed.  HENT:     Head: Normocephalic and atraumatic.     Right Ear: External ear normal.     Left Ear: External ear  normal.     Nose: No rhinorrhea.     Mouth/Throat:     Mouth: Mucous membranes are moist.     Pharynx: No oropharyngeal exudate or posterior oropharyngeal erythema.     Comments: Voice is hoarse but no stridor Eyes:     General: No scleral icterus.       Right eye: No discharge.        Left eye: No discharge.     Conjunctiva/sclera: Conjunctivae normal.  Neck:     Musculoskeletal: Neck supple.     Trachea: No tracheal deviation.  Cardiovascular:     Rate and Rhythm: Normal rate and regular rhythm.  Pulmonary:     Effort: Pulmonary effort is normal. No respiratory distress.     Breath sounds: Normal breath sounds. No stridor. No wheezing or rales.  Abdominal:     General: Bowel sounds are normal. There is no distension.     Palpations: Abdomen is soft.     Tenderness: There is no abdominal tenderness. There is no guarding or  rebound.  Musculoskeletal:        General: No tenderness.     Comments: Mild edema in the bilateral feet and lower legs  Skin:    General: Skin is warm and dry.     Findings: No rash.  Neurological:     Mental Status: She is alert.     Cranial Nerves: No cranial nerve deficit (no facial droop, extraocular movements intact, no slurred speech).     Sensory: No sensory deficit.     Motor: No abnormal muscle tone or seizure activity.     Coordination: Coordination normal.      ED Treatments / Results  Labs (all labs ordered are listed, but only abnormal results are displayed) Labs Reviewed  CBC - Abnormal; Notable for the following components:      Result Value   WBC 11.6 (*)    Hemoglobin 10.8 (*)    HCT 35.0 (*)    MCH 25.7 (*)    RDW 15.6 (*)    All other components within normal limits  BASIC METABOLIC PANEL - Abnormal; Notable for the following components:   CO2 20 (*)    Glucose, Bld 104 (*)    BUN 36 (*)    Creatinine, Ser 1.60 (*)    Calcium 8.8 (*)    GFR calc non Af Amer 28 (*)    GFR calc Af Amer 32 (*)    All other components within normal limits  INFLUENZA PANEL BY PCR (TYPE A & B)     Radiology Dg Chest 2 View  Result Date: 05/02/2018 CLINICAL DATA:  84 y/o  F; cough and congestion. EXAM: CHEST - 2 VIEW COMPARISON:  03/26/2018 chest radiograph. FINDINGS: Normal cardiac silhouette. Aortic atherosclerosis with calcification. Clear lungs. No pleural effusion or pneumothorax. No acute osseous abnormality is evident. Surgical clips project over the right axilla. IMPRESSION: No acute pulmonary process identified. Electronically Signed   By: Kristine Garbe M.D.   On: 05/02/2018 19:56    Procedures Procedures (including critical care time)  Medications Ordered in ED Medications  sodium chloride 0.9 % bolus 500 mL (500 mLs Intravenous New Bag/Given 05/02/18 1944)    Followed by  0.9 %  sodium chloride infusion (1,000 mLs Intravenous New Bag/Given  05/02/18 1946)     Initial Impression / Assessment and Plan / ED Course  I have reviewed the triage vital signs and the nursing notes.  Pertinent labs &  imaging results that were available during my care of the patient were reviewed by me and considered in my medical decision making (see chart for details).   Pt presented with uri sx.  Hgb is decreased from earlier today.  I doubt that drop considering she has not had any sx of bleeding to account for an acute drop compared to today.  Vitals are stable.  Renal function is stable.  No signs of pna on cxr.  No sepsis. Pt was given an IV fluid bolus.  She is feeling much better.  Stable for dc back to nursing facililty.  Final Clinical Impressions(s) / ED Diagnoses   Final diagnoses:  Upper respiratory tract infection, unspecified type    ED Discharge Orders    None       Dorie Rank, MD 05/02/18 2107

## 2018-05-02 NOTE — ED Triage Notes (Signed)
EMS responded to [facility] for pt with altered mental status.  Staff there states pt was unresponsive for a period of time.  Pt does not remember this and only complains of a cough she has had for some time.  Normal mentation at this time.

## 2018-05-03 ENCOUNTER — Non-Acute Institutional Stay (SKILLED_NURSING_FACILITY): Payer: Medicare HMO | Admitting: Internal Medicine

## 2018-05-03 ENCOUNTER — Encounter: Payer: Self-pay | Admitting: Internal Medicine

## 2018-05-03 DIAGNOSIS — R4182 Altered mental status, unspecified: Secondary | ICD-10-CM

## 2018-05-03 DIAGNOSIS — I639 Cerebral infarction, unspecified: Secondary | ICD-10-CM

## 2018-05-03 DIAGNOSIS — J069 Acute upper respiratory infection, unspecified: Secondary | ICD-10-CM | POA: Diagnosis not present

## 2018-05-03 DIAGNOSIS — I10 Essential (primary) hypertension: Secondary | ICD-10-CM

## 2018-05-03 NOTE — Progress Notes (Addendum)
Location:    [place] Room Number: 159/P Place of Service:  SNF [phone]) Provider:  Veleta Miners MD  Jani Gravel, MD  Patient Care Team: Jani Gravel, MD as PCP - General (Internal Medicine) Herminio Commons, MD as PCP - Cardiology (Cardiology)  Extended Emergency Contact Information Primary Emergency Contact: PRICE,[name] Address: [address]  [place] Home Phone: [phone] Relation: None Secondary Emergency Contact: [place] Mobile Phone: [phone] Relation: Niece  Code Status:  Full Code Goals of care: Advanced Directive information Advanced Directives 05/03/2018  Does Patient Have a Medical Advance Directive? Yes  Type of Advance Directive (No Data)  Does patient want to make changes to medical advance directive? No - Patient declined  Would patient like information on creating a medical advance directive? No - Patient declined     Chief Complaint  Patient presents with  . Acute Visit    F/U ED Visit    HPI:  Pt is a 84 y.o. female seen today for an acute visit for Follow up from ED. Patient is in SNF for therapy. She stayed in the hospital from 11/09-11/13 for Acute Ischemic Stroke  Patient was diagnosed with Acute Respiratory Bronchitis in SNF She was . Started on Antibiotics and Prednisone. But yesterday patient spiked temp of 101 and was found to be Lethargic. She was send to the ED for Eval. Her Chest Xray in the facility and in the hospital was negative for any acute process. Her Flu Swab was negative. She is back to her baseline. No Fever. She does have Cough with productive Sputum. No Chest pain or SOB. She is walking with the walker with therapy and is planning to go home by end of this week.  Past Medical History:  Diagnosis Date  . Hypertension   . Renal disorder    chronic kidney disease  . Rhabdomyolysis   . Stroke [facility])    History reviewed. No pertinent surgical history.  No Known  Allergies  Allergies as of 05/03/2018   No Known Allergies     Medication List       Accurate as of May 03, 2018 10:45 AM. Always use your most recent med list.        amLODipine 5 MG tablet Commonly known as:  NORVASC Take 0.5 tablets (2.5 mg total) by mouth daily.   atorvastatin 20 MG tablet Commonly known as:  LIPITOR Take 1 tablet (20 mg total) by mouth daily at 6 PM.   cefTRIAXone 1 g injection Commonly known as:  ROCEPHIN Inject 1 g into the muscle once. A day x 3 days   CLARITIN 10 MG Caps Generic drug:  Loratadine Take 10 mg by mouth daily.   clopidogrel 75 MG tablet Commonly known as:  PLAVIX Take 1 tablet (75 mg total) by mouth daily.   dextromethorphan 30 MG/5ML liquid Commonly known as:  DELSYM Take 5 mLs by mouth every 6 (six) hours as needed for cough.   guaiFENesin 100 MG/5ML Soln Commonly known as:  ROBITUSSIN Take 5 mLs by mouth every 6 (six) hours as needed for cough or to loosen phlegm.   ipratropium-albuterol 0.5-2.5 (3) MG/3ML Soln Commonly known as:  DUONEB Take 3 mLs by nebulization every 6 (six) hours as needed.   polyethylene glycol packet Commonly known as:  MIRALAX / GLYCOLAX Take 17 g by mouth daily as needed.   predniSONE 10 MG tablet Commonly known as:  DELTASONE  Take 30 mg by mouth daily with breakfast. X 3 days       Review of Systems  Constitutional: Negative for activity change and fever.  HENT: Positive for congestion.   Respiratory: Positive for cough. Negative for shortness of breath.   Cardiovascular: Negative.   Gastrointestinal: Negative.   Genitourinary: Negative.   Musculoskeletal: Positive for myalgias.  Skin: Negative.   Neurological: Positive for weakness.  Psychiatric/Behavioral: Negative.      There is no immunization history on file for this patient. Pertinent  Health Maintenance Due  Topic Date Due  . INFLUENZA VACCINE  06/02/2018 (Originally 12/16/2017)  . FOOT EXAM  06/02/2018 (Originally  07/16/1933)  . OPHTHALMOLOGY EXAM  06/02/2018 (Originally 07/16/1933)  . URINE MICROALBUMIN  06/02/2018 (Originally 07/16/1933)  . DEXA SCAN  06/02/2018 (Originally 07/15/1990)  . PNA vac Low Risk Adult (1 of 2 - PCV13) 06/02/2018 (Originally 07/15/1990)  . HEMOGLOBIN A1C  09/25/2018   No flowsheet data found. Functional Status Survey:    Vitals:   05/03/18 1011  BP: (!) 142/64  Pulse: 89  Resp: 18  Temp: 98.5 F (36.9 C)  TempSrc: Oral  SpO2: 95%   There is no height or weight on file to calculate BMI. Physical Exam Constitutional:      Appearance: Normal appearance.  HENT:     Head: Normocephalic.     Mouth/Throat:     Mouth: Mucous membranes are moist.  Eyes:     Pupils: Pupils are equal, round, and reactive to light.  Neck:     Musculoskeletal: Neck supple.  Cardiovascular:     Rate and Rhythm: Normal rate and regular rhythm.     Pulses: Normal pulses.     Heart sounds: Normal heart sounds. No murmur.  Pulmonary:     Effort: Pulmonary effort is normal.     Comments: Continues to have Few Rales Left Lower Lobe. Abdominal:     General: Abdomen is flat. Bowel sounds are normal. There is no distension.     Palpations: Abdomen is soft.     Tenderness: There is no abdominal tenderness. There is no guarding.  Musculoskeletal:        General: No swelling.  Skin:    General: Skin is warm and dry.  Neurological:     Mental Status: She is alert and oriented to person, place, and time.  Psychiatric:        Mood and Affect: Mood normal.        Behavior: Behavior normal.        Thought Content: Thought content normal.     Labs reviewed: Recent Labs    04/28/18 0722 05/02/18 1218 05/02/18 1954  NA 140 140 135  K 5.0 4.3 4.1  CL 108 107 109  CO2 25 25 20*  GLUCOSE 110* 84 104*  BUN 34* 35* 36*  CREATININE 1.73* 1.34* 1.60*  CALCIUM 10.1 9.8 8.8*   Recent Labs    03/26/18 2016 04/07/18 0700 04/27/18 0701  AST 65* 15 17  ALT 24 16 14   ALKPHOS 68 65 78   BILITOT 1.2 0.5 0.5  PROT 6.6 6.5 6.8  ALBUMIN 3.6 3.5 3.6   Recent Labs    04/07/18 0700 04/27/18 0701 05/02/18 1218 05/02/18 1954  WBC 5.4 4.7 8.5 11.6*  NEUTROABS 3.1 3.3 7.0  --   HGB 12.5 12.6 12.6 10.8*  HCT 41.5 42.0 42.3 35.0*  MCV 85.6 86.2 85.8 83.3  PLT 330 152 265 297   Lab Results  Component Value Date   TSH 3.619 03/28/2018   Lab Results  Component Value Date   HGBA1C 5.3 03/27/2018   Lab Results  Component Value Date   CHOL 182 03/28/2018   HDL 41 03/28/2018   LDLCALC 118 (H) 03/28/2018   TRIG 113 03/28/2018   CHOLHDL 4.4 03/28/2018    Significant Diagnostic Results in last 30 days:  Dg Chest 2 View  Result Date: 05/02/2018 CLINICAL DATA:  84 y/o  F; cough and congestion. EXAM: CHEST - 2 VIEW COMPARISON:  03/26/2018 chest radiograph. FINDINGS: Normal cardiac silhouette. Aortic atherosclerosis with calcification. Clear lungs. No pleural effusion or pneumothorax. No acute osseous abnormality is evident. Surgical clips project over the right axilla. IMPRESSION: No acute pulmonary process identified. Electronically Signed   By: Kristine Garbe M.D.   On: 05/02/2018 19:56    Assessment/Plan  Upper Respiratory Infection with Bronchitis Continue on Rocephin for 2 more days and then switch to Levaquin for 4 days Continue on prednisone for Wheezing. Also on Robitussin for cough  Acute ischemic stroke On  Plavix  Other Work up was negative Continue Therapy. Patient doing well and making good Prognosis. Therapy training her to be independent in ADL as she lives alone though has supportive family close by  Non-traumatic rhabdomyolysis Resolved  Essential hypertension BP control on Norvasc  Hyperlipidemia,  on Lipitor for sec prevention CKD stage 3 Creat near baseline  Family/ staff Communication:   Labs/tests ordered:

## 2018-05-05 ENCOUNTER — Non-Acute Institutional Stay (SKILLED_NURSING_FACILITY): Payer: Medicare HMO | Admitting: Internal Medicine

## 2018-05-05 ENCOUNTER — Encounter: Payer: Self-pay | Admitting: Internal Medicine

## 2018-05-05 DIAGNOSIS — E785 Hyperlipidemia, unspecified: Secondary | ICD-10-CM | POA: Diagnosis not present

## 2018-05-05 DIAGNOSIS — J209 Acute bronchitis, unspecified: Secondary | ICD-10-CM | POA: Diagnosis not present

## 2018-05-05 DIAGNOSIS — I10 Essential (primary) hypertension: Secondary | ICD-10-CM | POA: Diagnosis not present

## 2018-05-05 DIAGNOSIS — I639 Cerebral infarction, unspecified: Secondary | ICD-10-CM | POA: Diagnosis not present

## 2018-05-05 NOTE — Progress Notes (Signed)
Location:    [place] Room Number: 159/P Place of Service:  SNF [phone])  Provider: Veleta Miners MD  PCP: Jani Gravel, MD Patient Care Team: Jani Gravel, MD as PCP - General (Internal Medicine) Herminio Commons, MD as PCP - Cardiology (Cardiology)  Extended Emergency Contact Information Primary Emergency Contact: PRICE,[name] Address: [address]  [place] Home Phone: [phone] Relation: None Secondary Emergency Contact: [place] Mobile Phone: [phone] Relation: Niece  Code Status: Full Code Goals of care:  Advanced Directive information Advanced Directives 05/05/2018  Does Patient Have a Medical Advance Directive? Yes  Type of Advance Directive (No Data)  Does patient want to make changes to medical advance directive? No - Patient declined  Would patient like information on creating a medical advance directive? No - Patient declined     No Known Allergies  Chief Complaint  Patient presents with  . Discharge Note    Discharge Visit    HPI:  84 y.o. female  Seen today for discharge home from the facility. She stayed in the hospital from 11/09-11/13 for Acute Ischemic Stroke.  Patient has h/o Hypertension who came to ED when she was sitting next to her Nephew andtalking when suddenly she slumped over her chair and became unconscious. The EMS came and her saturation was in 24s. Patient says she became awake in the hospital. She does not remember anything else. Per nephew sitting in the room and says there was no signs of any seizure activity. In the hospital patient had a MRI done which showed subacute infarct in right cerebellum and left frontal parietal matter.Her other work-up including 2D echo showed EF of 65% with mild to moderate AS.Carotids were negative for any stenosis.  Patient did well in facility and was walking with the walker. But then she developed fever cough and weakness.Patient spiked temp of 101  and was found to be Lethargic. She was send to the ED for Eval. Her Chest Xray in the facility and in the hospital was negative for any acute process. Her Flu Swab was negative. She was started on Prednisone, Rocephin and Mucinex. Patient continues to complain of Cough. But she is walking with therapy and is independent in Transfers. She is on Oral Antibiotics. She is going home by Herself but has very supportive family who live near by and help her.     Past Medical History:  Diagnosis Date  . Hypertension   . Renal disorder    chronic kidney disease  . Rhabdomyolysis   . Stroke [facility])     History reviewed. No pertinent surgical history.    reports that she has never smoked. She has never used smokeless tobacco. She reports that she does not drink alcohol or use drugs. Social History   Socioeconomic History  . Marital status: Widowed    Spouse name: Not on file  . Number of children: Not on file  . Years of education: Not on file  . Highest education level: Not on file  Occupational History  . Not on file  Social Needs  . Financial resource strain: Patient refused  . Food insecurity:    Worry: Patient refused    Inability: Patient refused  . Transportation needs:    Medical: Patient refused    Non-medical: Patient refused  Tobacco Use  . Smoking status: Never Smoker  . Smokeless tobacco: Never Used  Substance and Sexual Activity  . Alcohol use: No  .  Drug use: No  . Sexual activity: Not Currently  Lifestyle  . Physical activity:    Days per week: Patient refused    Minutes per session: Patient refused  . Stress: Patient refused  Relationships  . Social connections:    Talks on phone: Patient refused    Gets together: Patient refused    Attends religious service: Patient refused    Active member of club or organization: Patient refused    Attends meetings of clubs or organizations: Patient refused    Relationship status: Patient refused  . Intimate partner  violence:    Fear of current or ex partner: Patient refused    Emotionally abused: Patient refused    Physically abused: Patient refused    Forced sexual activity: Patient refused  Other Topics Concern  . Not on file  Social History Narrative  . Not on file   Functional Status Survey:    No Known Allergies  Pertinent  Health Maintenance Due  Topic Date Due  . INFLUENZA VACCINE  06/02/2018 (Originally 12/16/2017)  . FOOT EXAM  06/02/2018 (Originally 07/16/1933)  . OPHTHALMOLOGY EXAM  06/02/2018 (Originally 07/16/1933)  . URINE MICROALBUMIN  06/02/2018 (Originally [birth year]16/1933)  . DEXA SCAN  06/02/2018 (Originally 07/15/1990)  . PNA vac Low Risk Adult (1 of 2 - PCV13) 06/02/2018 (Originally 07/15/1990)  . HEMOGLOBIN A1C  09/25/2018    Medications: Outpatient Encounter Medications as of 05/05/2018  Medication Sig  . amLODipine (NORVASC) 5 MG tablet Take 0.5 tablets (2.5 mg total) by mouth daily.  Marland Kitchen atorvastatin (LIPITOR) 20 MG tablet Take 1 tablet (20 mg total) by mouth daily at 6 PM.  . cefTRIAXone (ROCEPHIN) 1 g injection Inject 1 g into the muscle once. A day x 3 days  . clopidogrel (PLAVIX) 75 MG tablet Take 1 tablet (75 mg total) by mouth daily.  Marland Kitchen dextromethorphan (DELSYM) 30 MG/5ML liquid Take 5 mLs by mouth every 6 (six) hours as needed for cough.   Marland Kitchen ipratropium-albuterol (DUONEB) 0.5-2.5 (3) MG/3ML SOLN Take 3 mLs by nebulization every 6 (six) hours as needed.  Marland Kitchen levofloxacin (LEVAQUIN) 500 MG tablet Take 500 mg by mouth daily.  . Loratadine (CLARITIN) 10 MG CAPS Take 10 mg by mouth daily.  . polyethylene glycol (MIRALAX / GLYCOLAX) packet Take 17 g by mouth daily as needed.  . [DISCONTINUED] guaiFENesin (ROBITUSSIN) 100 MG/5ML SOLN Take 5 mLs by mouth every 6 (six) hours as needed for cough or to loosen phlegm.  . [DISCONTINUED] predniSONE (DELTASONE) 10 MG tablet Take 30 mg by mouth daily with breakfast. X 3 days   No facility-administered encounter medications on file as of  05/05/2018.      Review of Systems  Vitals:   05/05/18 1609  BP: 134/72  Pulse: 76  Resp: 18  Temp: 98 F (36.7 C)  TempSrc: Oral  SpO2: 99%   There is no height or weight on file to calculate BMI. Physical Exam Constitutional:      Appearance: Normal appearance.  HENT:     Head: Normocephalic.     Mouth/Throat:     Mouth: Mucous membranes are moist.  Eyes:     Pupils: Pupils are equal, round, and reactive to light.  Neck:     Musculoskeletal: Neck supple.  Cardiovascular:     Rate and Rhythm: Normal rate and regular rhythm.     Pulses: Normal pulses.     Heart sounds: Normal heart sounds. No murmur.  Pulmonary:     Effort: Pulmonary effort  is normal.     Comments: Continues to have Few Rales Left Lower Lobe. And Expiratory Wheezing Bilateral. Abdominal:     General: Abdomen is flat. Bowel sounds are normal. There is no distension.     Palpations: Abdomen is soft.     Tenderness: There is no abdominal tenderness. There is no guarding.  Musculoskeletal:        General: No swelling.  Skin:    General: Skin is warm and dry.  Neurological:     Mental Status: She is alert and oriented to person, place, and time.  Psychiatric:        Mood and Affect: Mood normal.        Behavior: Behavior normal.        Thought Content: Thought content normal.     Labs reviewed: Basic Metabolic Panel: Recent Labs    04/28/18 0722 05/02/18 1218 05/02/18 1954  NA 140 140 135  K 5.0 4.3 4.1  CL 108 107 109  CO2 25 25 20*  GLUCOSE 110* 84 104*  BUN 34* 35* 36*  CREATININE 1.73* 1.34* 1.60*  CALCIUM 10.1 9.8 8.8*   Liver Function Tests: Recent Labs    03/26/18 2016 04/07/18 0700 04/27/18 0701  AST 65* 15 17  ALT 24 16 14   ALKPHOS 68 65 78  BILITOT 1.2 0.5 0.5  PROT 6.6 6.5 6.8  ALBUMIN 3.6 3.5 3.6   No results for input(s): LIPASE, AMYLASE in the last 8760 hours. Recent Labs    03/28/18 0512  AMMONIA 25   CBC: Recent Labs    04/07/18 0700 04/27/18 0701  05/02/18 1218 05/02/18 1954  WBC 5.4 4.7 8.5 11.6*  NEUTROABS 3.1 3.3 7.0  --   HGB 12.5 12.6 12.6 10.8*  HCT 41.5 42.0 42.3 35.0*  MCV 85.6 86.2 85.8 83.3  PLT 330 152 265 297   Cardiac Enzymes: Recent Labs    03/26/18 2016 03/27/18 0611 03/28/18 0512 03/29/18 0532 03/30/18 0449  CKTOTAL 3,077* 1,856* 598* 296* 205  TROPONINI 0.06* 0.06*  --   --   --    BNP: Invalid input(s): POCBNP CBG: Recent Labs    03/26/18 2033  GLUCAP 95    Procedures and Imaging Studies During Stay: Dg Chest 2 View  Result Date: 05/02/2018 CLINICAL DATA:  84 y/o  F; cough and congestion. EXAM: CHEST - 2 VIEW COMPARISON:  03/26/2018 chest radiograph. FINDINGS: Normal cardiac silhouette. Aortic atherosclerosis with calcification. Clear lungs. No pleural effusion or pneumothorax. No acute osseous abnormality is evident. Surgical clips project over the right axilla. IMPRESSION: No acute pulmonary process identified. Electronically Signed   By: Kristine Garbe M.D.   On: 05/02/2018 19:56    Assessment/Plan:   Acute Upper respiratory Bronchitis Continue Prednisone taper. On Levaquin for 4 more days Also on Mucinex Follow up with PCP if not better Chest Xray was negative and Flu swab Negative Acute ischemic stroke  Was On Dual therapy and now Plavix after 3 weeks Other Work up was negative Follow up with Neurology and PCP  Syncope and collapse Most likely due to Ischemic stroke At her Baseline now Cardiac W/U Was negative D/c Ativan  Non-traumatic rhabdomyolysis Resolved  Essential hypertension BP control on Norvasc  Hyperlipidemia,  Started on Lipitor for sec prevention CKD stage 3 Creat near baseline Constipation On Miralax PRn   Future labs/tests needed:    Discharge planning of More then 35 min Patient will be followed by Home health and PT. Also needs follow up  with her PCP

## 2018-05-08 DIAGNOSIS — I129 Hypertensive chronic kidney disease with stage 1 through stage 4 chronic kidney disease, or unspecified chronic kidney disease: Secondary | ICD-10-CM | POA: Diagnosis not present

## 2018-05-08 DIAGNOSIS — Z792 Long term (current) use of antibiotics: Secondary | ICD-10-CM | POA: Diagnosis not present

## 2018-05-08 DIAGNOSIS — E785 Hyperlipidemia, unspecified: Secondary | ICD-10-CM | POA: Diagnosis not present

## 2018-05-08 DIAGNOSIS — Z9181 History of falling: Secondary | ICD-10-CM | POA: Diagnosis not present

## 2018-05-08 DIAGNOSIS — R2681 Unsteadiness on feet: Secondary | ICD-10-CM | POA: Diagnosis not present

## 2018-05-08 DIAGNOSIS — I69398 Other sequelae of cerebral infarction: Secondary | ICD-10-CM | POA: Diagnosis not present

## 2018-05-08 DIAGNOSIS — M6281 Muscle weakness (generalized): Secondary | ICD-10-CM | POA: Diagnosis not present

## 2018-05-08 DIAGNOSIS — N183 Chronic kidney disease, stage 3 (moderate): Secondary | ICD-10-CM | POA: Diagnosis not present

## 2018-05-08 DIAGNOSIS — F419 Anxiety disorder, unspecified: Secondary | ICD-10-CM | POA: Diagnosis not present

## 2018-05-09 DIAGNOSIS — N183 Chronic kidney disease, stage 3 (moderate): Secondary | ICD-10-CM | POA: Diagnosis not present

## 2018-05-09 DIAGNOSIS — E785 Hyperlipidemia, unspecified: Secondary | ICD-10-CM | POA: Diagnosis not present

## 2018-05-09 DIAGNOSIS — R2681 Unsteadiness on feet: Secondary | ICD-10-CM | POA: Diagnosis not present

## 2018-05-09 DIAGNOSIS — Z9181 History of falling: Secondary | ICD-10-CM | POA: Diagnosis not present

## 2018-05-09 DIAGNOSIS — I69398 Other sequelae of cerebral infarction: Secondary | ICD-10-CM | POA: Diagnosis not present

## 2018-05-09 DIAGNOSIS — I129 Hypertensive chronic kidney disease with stage 1 through stage 4 chronic kidney disease, or unspecified chronic kidney disease: Secondary | ICD-10-CM | POA: Diagnosis not present

## 2018-05-09 DIAGNOSIS — F419 Anxiety disorder, unspecified: Secondary | ICD-10-CM | POA: Diagnosis not present

## 2018-05-09 DIAGNOSIS — Z792 Long term (current) use of antibiotics: Secondary | ICD-10-CM | POA: Diagnosis not present

## 2018-05-09 DIAGNOSIS — M6281 Muscle weakness (generalized): Secondary | ICD-10-CM | POA: Diagnosis not present

## 2018-05-10 ENCOUNTER — Other Ambulatory Visit: Payer: Self-pay

## 2018-05-10 DIAGNOSIS — N183 Chronic kidney disease, stage 3 (moderate): Secondary | ICD-10-CM | POA: Diagnosis not present

## 2018-05-10 DIAGNOSIS — I69398 Other sequelae of cerebral infarction: Secondary | ICD-10-CM | POA: Diagnosis not present

## 2018-05-10 DIAGNOSIS — Z792 Long term (current) use of antibiotics: Secondary | ICD-10-CM | POA: Diagnosis not present

## 2018-05-10 DIAGNOSIS — E785 Hyperlipidemia, unspecified: Secondary | ICD-10-CM | POA: Diagnosis not present

## 2018-05-10 DIAGNOSIS — R2681 Unsteadiness on feet: Secondary | ICD-10-CM | POA: Diagnosis not present

## 2018-05-10 DIAGNOSIS — M6281 Muscle weakness (generalized): Secondary | ICD-10-CM | POA: Diagnosis not present

## 2018-05-10 DIAGNOSIS — Z9181 History of falling: Secondary | ICD-10-CM | POA: Diagnosis not present

## 2018-05-10 DIAGNOSIS — F419 Anxiety disorder, unspecified: Secondary | ICD-10-CM | POA: Diagnosis not present

## 2018-05-10 DIAGNOSIS — I129 Hypertensive chronic kidney disease with stage 1 through stage 4 chronic kidney disease, or unspecified chronic kidney disease: Secondary | ICD-10-CM | POA: Diagnosis not present

## 2018-05-10 NOTE — Patient Outreach (Signed)
[facility]) Care Management  05/10/2018  Toni Gomez 1923/09/28 [id number]  Transition of care  Referral date: 05/10/18 Referral source: discharged from an inpatient admission from [facility] on 05/07/18 .  Insurance: [employer] Attempt #1  Telephone call to patient regarding referral. Unable to reach patient. HIPAA compliant voice message left with call back phone number.   PLAN: RNCM will attempt 2nd telephone call to patient within 4 business days. RNCM will send outreach letter.   [name] RN,BSN, [place] Telephonic  [phone]

## 2018-05-13 DIAGNOSIS — R2681 Unsteadiness on feet: Secondary | ICD-10-CM | POA: Diagnosis not present

## 2018-05-13 DIAGNOSIS — F419 Anxiety disorder, unspecified: Secondary | ICD-10-CM | POA: Diagnosis not present

## 2018-05-13 DIAGNOSIS — Z9181 History of falling: Secondary | ICD-10-CM | POA: Diagnosis not present

## 2018-05-13 DIAGNOSIS — I129 Hypertensive chronic kidney disease with stage 1 through stage 4 chronic kidney disease, or unspecified chronic kidney disease: Secondary | ICD-10-CM | POA: Diagnosis not present

## 2018-05-13 DIAGNOSIS — I69398 Other sequelae of cerebral infarction: Secondary | ICD-10-CM | POA: Diagnosis not present

## 2018-05-13 DIAGNOSIS — N183 Chronic kidney disease, stage 3 (moderate): Secondary | ICD-10-CM | POA: Diagnosis not present

## 2018-05-13 DIAGNOSIS — M6281 Muscle weakness (generalized): Secondary | ICD-10-CM | POA: Diagnosis not present

## 2018-05-13 DIAGNOSIS — E785 Hyperlipidemia, unspecified: Secondary | ICD-10-CM | POA: Diagnosis not present

## 2018-05-13 DIAGNOSIS — Z792 Long term (current) use of antibiotics: Secondary | ICD-10-CM | POA: Diagnosis not present

## 2018-05-16 DIAGNOSIS — M6281 Muscle weakness (generalized): Secondary | ICD-10-CM | POA: Diagnosis not present

## 2018-05-16 DIAGNOSIS — Z792 Long term (current) use of antibiotics: Secondary | ICD-10-CM | POA: Diagnosis not present

## 2018-05-16 DIAGNOSIS — E785 Hyperlipidemia, unspecified: Secondary | ICD-10-CM | POA: Diagnosis not present

## 2018-05-16 DIAGNOSIS — R2681 Unsteadiness on feet: Secondary | ICD-10-CM | POA: Diagnosis not present

## 2018-05-16 DIAGNOSIS — I69398 Other sequelae of cerebral infarction: Secondary | ICD-10-CM | POA: Diagnosis not present

## 2018-05-16 DIAGNOSIS — Z9181 History of falling: Secondary | ICD-10-CM | POA: Diagnosis not present

## 2018-05-16 DIAGNOSIS — N183 Chronic kidney disease, stage 3 (moderate): Secondary | ICD-10-CM | POA: Diagnosis not present

## 2018-05-16 DIAGNOSIS — F419 Anxiety disorder, unspecified: Secondary | ICD-10-CM | POA: Diagnosis not present

## 2018-05-16 DIAGNOSIS — I129 Hypertensive chronic kidney disease with stage 1 through stage 4 chronic kidney disease, or unspecified chronic kidney disease: Secondary | ICD-10-CM | POA: Diagnosis not present

## 2018-05-17 ENCOUNTER — Other Ambulatory Visit: Payer: Self-pay

## 2018-05-17 DIAGNOSIS — Z792 Long term (current) use of antibiotics: Secondary | ICD-10-CM | POA: Diagnosis not present

## 2018-05-17 DIAGNOSIS — M6281 Muscle weakness (generalized): Secondary | ICD-10-CM | POA: Diagnosis not present

## 2018-05-17 DIAGNOSIS — N183 Chronic kidney disease, stage 3 (moderate): Secondary | ICD-10-CM | POA: Diagnosis not present

## 2018-05-17 DIAGNOSIS — Z9181 History of falling: Secondary | ICD-10-CM | POA: Diagnosis not present

## 2018-05-17 DIAGNOSIS — R2681 Unsteadiness on feet: Secondary | ICD-10-CM | POA: Diagnosis not present

## 2018-05-17 DIAGNOSIS — F419 Anxiety disorder, unspecified: Secondary | ICD-10-CM | POA: Diagnosis not present

## 2018-05-17 DIAGNOSIS — E785 Hyperlipidemia, unspecified: Secondary | ICD-10-CM | POA: Diagnosis not present

## 2018-05-17 DIAGNOSIS — I69398 Other sequelae of cerebral infarction: Secondary | ICD-10-CM | POA: Diagnosis not present

## 2018-05-17 DIAGNOSIS — I129 Hypertensive chronic kidney disease with stage 1 through stage 4 chronic kidney disease, or unspecified chronic kidney disease: Secondary | ICD-10-CM | POA: Diagnosis not present

## 2018-05-17 NOTE — Patient Outreach (Signed)
[place] [facility]) Care Management  05/17/2018  Toni Gomez 07-Dec-1923 [id number]  Referral date: 05/10/18 Referral source: discharged from an inpatient admission from [facility] on 05/07/18 .  Insurance: [employer] Attempt #2  Telephone call to patient regarding referral. Unable to reach patient. HIPAA compliant voice message left with call back phone number.   PLAN: RNCM will attempt 3rd telephone call to patient within 4 business days. RNCM will send outreach letter.   [name] RN,BSN, [place] Telephonic  [phone]

## 2018-05-19 DIAGNOSIS — E785 Hyperlipidemia, unspecified: Secondary | ICD-10-CM | POA: Diagnosis not present

## 2018-05-19 DIAGNOSIS — F419 Anxiety disorder, unspecified: Secondary | ICD-10-CM | POA: Diagnosis not present

## 2018-05-19 DIAGNOSIS — N183 Chronic kidney disease, stage 3 (moderate): Secondary | ICD-10-CM | POA: Diagnosis not present

## 2018-05-19 DIAGNOSIS — I69398 Other sequelae of cerebral infarction: Secondary | ICD-10-CM | POA: Diagnosis not present

## 2018-05-19 DIAGNOSIS — I129 Hypertensive chronic kidney disease with stage 1 through stage 4 chronic kidney disease, or unspecified chronic kidney disease: Secondary | ICD-10-CM | POA: Diagnosis not present

## 2018-05-19 DIAGNOSIS — R2681 Unsteadiness on feet: Secondary | ICD-10-CM | POA: Diagnosis not present

## 2018-05-19 DIAGNOSIS — Z792 Long term (current) use of antibiotics: Secondary | ICD-10-CM | POA: Diagnosis not present

## 2018-05-19 DIAGNOSIS — M6281 Muscle weakness (generalized): Secondary | ICD-10-CM | POA: Diagnosis not present

## 2018-05-19 DIAGNOSIS — Z9181 History of falling: Secondary | ICD-10-CM | POA: Diagnosis not present

## 2018-05-20 DIAGNOSIS — F419 Anxiety disorder, unspecified: Secondary | ICD-10-CM | POA: Diagnosis not present

## 2018-05-20 DIAGNOSIS — N183 Chronic kidney disease, stage 3 (moderate): Secondary | ICD-10-CM | POA: Diagnosis not present

## 2018-05-20 DIAGNOSIS — Z792 Long term (current) use of antibiotics: Secondary | ICD-10-CM | POA: Diagnosis not present

## 2018-05-20 DIAGNOSIS — I129 Hypertensive chronic kidney disease with stage 1 through stage 4 chronic kidney disease, or unspecified chronic kidney disease: Secondary | ICD-10-CM | POA: Diagnosis not present

## 2018-05-20 DIAGNOSIS — I69398 Other sequelae of cerebral infarction: Secondary | ICD-10-CM | POA: Diagnosis not present

## 2018-05-20 DIAGNOSIS — Z9181 History of falling: Secondary | ICD-10-CM | POA: Diagnosis not present

## 2018-05-20 DIAGNOSIS — E785 Hyperlipidemia, unspecified: Secondary | ICD-10-CM | POA: Diagnosis not present

## 2018-05-20 DIAGNOSIS — M6281 Muscle weakness (generalized): Secondary | ICD-10-CM | POA: Diagnosis not present

## 2018-05-20 DIAGNOSIS — R2681 Unsteadiness on feet: Secondary | ICD-10-CM | POA: Diagnosis not present

## 2018-05-23 ENCOUNTER — Ambulatory Visit: Payer: Self-pay

## 2018-05-23 ENCOUNTER — Other Ambulatory Visit: Payer: Self-pay

## 2018-05-23 DIAGNOSIS — I10 Essential (primary) hypertension: Secondary | ICD-10-CM | POA: Diagnosis not present

## 2018-05-23 DIAGNOSIS — E7800 Pure hypercholesterolemia, unspecified: Secondary | ICD-10-CM | POA: Diagnosis not present

## 2018-05-23 DIAGNOSIS — E538 Deficiency of other specified B group vitamins: Secondary | ICD-10-CM | POA: Diagnosis not present

## 2018-05-23 DIAGNOSIS — I639 Cerebral infarction, unspecified: Secondary | ICD-10-CM | POA: Diagnosis not present

## 2018-05-23 DIAGNOSIS — M6282 Rhabdomyolysis: Secondary | ICD-10-CM | POA: Diagnosis not present

## 2018-05-23 NOTE — Patient Outreach (Addendum)
[place] [facility]) Care Management  05/23/2018  Toni Gomez 28-Dec-1923 [id number]  Referral date:05/10/18 Referral source:discharged from an inpatient admission from [facility] on 05/07/18 . Insurance:[employer]  Telephone call to patient to regarding transition of care referral. HIPAA verified.  Explained reason for call. Patient states she is doing good. She confirms she was discharged from the skilled nursing facility on 05/07/18.  Patient states she was in the hospital due to a " mini stroke."  Patient reports she had a follow up visit with her primary MD on today . She states there was no change in her treatment plan.  Patient state she is receiving home health therapy. She states she has contact information for them if she needed to call.  Patient states she has good support from her nephew and neighbor. She states she has transportation to her appointments and family that assist her with care.   Patient states she has her medications and takes them as prescribed. Patient gave verbal authorization to speak with, [name] regarding her medication review and health information. RNCm discussed and offered ongoing transition of care follow up. Patient declined stating she does not feel like she needs additional follow up with a nurse. She states she is managing well. Patient denies any new onset of symptoms. RNCM discussed sign/symptoms of stroke with patient. Advised patient that 911 should be called for stroke like symptoms. Patient verbalized understanding.  Patient denied any further needs or concerns.  RNCM offered to send patient [facility] care management brochure/ magnet for future reference. Patient verbally agreed.   PLAN; RNCM will close patient due to patient being assessed and having no further needs.  RNCM will send patient [facility] care management brochure/ magnet.  RNCM will send closure letter to patients primary MD.   [name] RN,BSN,CCM [facility] Telephonic   [phone]

## 2018-05-23 NOTE — Patient Outreach (Signed)
[place] [facility]) Care Management  05/23/2018  Toni Gomez December 15, 1923 [id number]  Referral date:05/10/18 Referral source:discharged from an inpatient admission from [facility] on 05/07/18 . Insurance:[employer] Attempt #3  Telephone call to patient regarding transition of care referral.  Attempted call to listed home number. Unable to reach patient. Phone only rang.   Attempted call to patients listed emergency contact.  [name].  Ms. Corinna Capra states she will be arriving at patients home in approximately 15 minutes and request return call to patients home number.  Patients home phone number given to [facility] by contact.   PLAN: RNCM will attempt return call to patient as requested.   [name] RN,BSN,CCM [facility] Telephonic  [phone]

## 2018-05-24 ENCOUNTER — Encounter: Payer: Self-pay | Admitting: Neurology

## 2018-05-24 DIAGNOSIS — E538 Deficiency of other specified B group vitamins: Secondary | ICD-10-CM | POA: Diagnosis not present

## 2018-05-24 DIAGNOSIS — M6281 Muscle weakness (generalized): Secondary | ICD-10-CM | POA: Diagnosis not present

## 2018-05-24 DIAGNOSIS — R2681 Unsteadiness on feet: Secondary | ICD-10-CM | POA: Diagnosis not present

## 2018-05-24 DIAGNOSIS — F419 Anxiety disorder, unspecified: Secondary | ICD-10-CM | POA: Diagnosis not present

## 2018-05-24 DIAGNOSIS — Z792 Long term (current) use of antibiotics: Secondary | ICD-10-CM | POA: Diagnosis not present

## 2018-05-24 DIAGNOSIS — E7800 Pure hypercholesterolemia, unspecified: Secondary | ICD-10-CM | POA: Diagnosis not present

## 2018-05-24 DIAGNOSIS — E785 Hyperlipidemia, unspecified: Secondary | ICD-10-CM | POA: Diagnosis not present

## 2018-05-24 DIAGNOSIS — N183 Chronic kidney disease, stage 3 (moderate): Secondary | ICD-10-CM | POA: Diagnosis not present

## 2018-05-24 DIAGNOSIS — I69398 Other sequelae of cerebral infarction: Secondary | ICD-10-CM | POA: Diagnosis not present

## 2018-05-24 DIAGNOSIS — Z9181 History of falling: Secondary | ICD-10-CM | POA: Diagnosis not present

## 2018-05-24 DIAGNOSIS — I129 Hypertensive chronic kidney disease with stage 1 through stage 4 chronic kidney disease, or unspecified chronic kidney disease: Secondary | ICD-10-CM | POA: Diagnosis not present

## 2018-05-25 DIAGNOSIS — E785 Hyperlipidemia, unspecified: Secondary | ICD-10-CM | POA: Diagnosis not present

## 2018-05-25 DIAGNOSIS — Z9181 History of falling: Secondary | ICD-10-CM | POA: Diagnosis not present

## 2018-05-25 DIAGNOSIS — I129 Hypertensive chronic kidney disease with stage 1 through stage 4 chronic kidney disease, or unspecified chronic kidney disease: Secondary | ICD-10-CM | POA: Diagnosis not present

## 2018-05-25 DIAGNOSIS — N183 Chronic kidney disease, stage 3 (moderate): Secondary | ICD-10-CM | POA: Diagnosis not present

## 2018-05-25 DIAGNOSIS — I69398 Other sequelae of cerebral infarction: Secondary | ICD-10-CM | POA: Diagnosis not present

## 2018-05-25 DIAGNOSIS — R2681 Unsteadiness on feet: Secondary | ICD-10-CM | POA: Diagnosis not present

## 2018-05-25 DIAGNOSIS — Z792 Long term (current) use of antibiotics: Secondary | ICD-10-CM | POA: Diagnosis not present

## 2018-05-25 DIAGNOSIS — M6281 Muscle weakness (generalized): Secondary | ICD-10-CM | POA: Diagnosis not present

## 2018-05-25 DIAGNOSIS — F419 Anxiety disorder, unspecified: Secondary | ICD-10-CM | POA: Diagnosis not present

## 2018-05-26 DIAGNOSIS — F419 Anxiety disorder, unspecified: Secondary | ICD-10-CM | POA: Diagnosis not present

## 2018-05-26 DIAGNOSIS — I69398 Other sequelae of cerebral infarction: Secondary | ICD-10-CM | POA: Diagnosis not present

## 2018-05-26 DIAGNOSIS — R2681 Unsteadiness on feet: Secondary | ICD-10-CM | POA: Diagnosis not present

## 2018-05-26 DIAGNOSIS — Z792 Long term (current) use of antibiotics: Secondary | ICD-10-CM | POA: Diagnosis not present

## 2018-05-26 DIAGNOSIS — M6281 Muscle weakness (generalized): Secondary | ICD-10-CM | POA: Diagnosis not present

## 2018-05-26 DIAGNOSIS — Z9181 History of falling: Secondary | ICD-10-CM | POA: Diagnosis not present

## 2018-05-26 DIAGNOSIS — E785 Hyperlipidemia, unspecified: Secondary | ICD-10-CM | POA: Diagnosis not present

## 2018-05-26 DIAGNOSIS — N183 Chronic kidney disease, stage 3 (moderate): Secondary | ICD-10-CM | POA: Diagnosis not present

## 2018-05-26 DIAGNOSIS — I129 Hypertensive chronic kidney disease with stage 1 through stage 4 chronic kidney disease, or unspecified chronic kidney disease: Secondary | ICD-10-CM | POA: Diagnosis not present

## 2018-05-27 DIAGNOSIS — M6281 Muscle weakness (generalized): Secondary | ICD-10-CM | POA: Diagnosis not present

## 2018-05-27 DIAGNOSIS — Z792 Long term (current) use of antibiotics: Secondary | ICD-10-CM | POA: Diagnosis not present

## 2018-05-27 DIAGNOSIS — R2681 Unsteadiness on feet: Secondary | ICD-10-CM | POA: Diagnosis not present

## 2018-05-27 DIAGNOSIS — I129 Hypertensive chronic kidney disease with stage 1 through stage 4 chronic kidney disease, or unspecified chronic kidney disease: Secondary | ICD-10-CM | POA: Diagnosis not present

## 2018-05-27 DIAGNOSIS — Z9181 History of falling: Secondary | ICD-10-CM | POA: Diagnosis not present

## 2018-05-27 DIAGNOSIS — E785 Hyperlipidemia, unspecified: Secondary | ICD-10-CM | POA: Diagnosis not present

## 2018-05-27 DIAGNOSIS — N183 Chronic kidney disease, stage 3 (moderate): Secondary | ICD-10-CM | POA: Diagnosis not present

## 2018-05-27 DIAGNOSIS — I69398 Other sequelae of cerebral infarction: Secondary | ICD-10-CM | POA: Diagnosis not present

## 2018-05-27 DIAGNOSIS — F419 Anxiety disorder, unspecified: Secondary | ICD-10-CM | POA: Diagnosis not present

## 2018-05-30 DIAGNOSIS — I129 Hypertensive chronic kidney disease with stage 1 through stage 4 chronic kidney disease, or unspecified chronic kidney disease: Secondary | ICD-10-CM | POA: Diagnosis not present

## 2018-05-30 DIAGNOSIS — M6281 Muscle weakness (generalized): Secondary | ICD-10-CM | POA: Diagnosis not present

## 2018-05-30 DIAGNOSIS — N183 Chronic kidney disease, stage 3 (moderate): Secondary | ICD-10-CM | POA: Diagnosis not present

## 2018-05-30 DIAGNOSIS — R2681 Unsteadiness on feet: Secondary | ICD-10-CM | POA: Diagnosis not present

## 2018-05-30 DIAGNOSIS — E785 Hyperlipidemia, unspecified: Secondary | ICD-10-CM | POA: Diagnosis not present

## 2018-05-30 DIAGNOSIS — Z792 Long term (current) use of antibiotics: Secondary | ICD-10-CM | POA: Diagnosis not present

## 2018-05-30 DIAGNOSIS — F419 Anxiety disorder, unspecified: Secondary | ICD-10-CM | POA: Diagnosis not present

## 2018-05-30 DIAGNOSIS — I69398 Other sequelae of cerebral infarction: Secondary | ICD-10-CM | POA: Diagnosis not present

## 2018-05-30 DIAGNOSIS — Z9181 History of falling: Secondary | ICD-10-CM | POA: Diagnosis not present

## 2018-05-31 DIAGNOSIS — E785 Hyperlipidemia, unspecified: Secondary | ICD-10-CM | POA: Diagnosis not present

## 2018-05-31 DIAGNOSIS — I69398 Other sequelae of cerebral infarction: Secondary | ICD-10-CM | POA: Diagnosis not present

## 2018-05-31 DIAGNOSIS — M6281 Muscle weakness (generalized): Secondary | ICD-10-CM | POA: Diagnosis not present

## 2018-05-31 DIAGNOSIS — Z9181 History of falling: Secondary | ICD-10-CM | POA: Diagnosis not present

## 2018-05-31 DIAGNOSIS — R2681 Unsteadiness on feet: Secondary | ICD-10-CM | POA: Diagnosis not present

## 2018-05-31 DIAGNOSIS — F419 Anxiety disorder, unspecified: Secondary | ICD-10-CM | POA: Diagnosis not present

## 2018-05-31 DIAGNOSIS — I129 Hypertensive chronic kidney disease with stage 1 through stage 4 chronic kidney disease, or unspecified chronic kidney disease: Secondary | ICD-10-CM | POA: Diagnosis not present

## 2018-05-31 DIAGNOSIS — Z792 Long term (current) use of antibiotics: Secondary | ICD-10-CM | POA: Diagnosis not present

## 2018-05-31 DIAGNOSIS — N183 Chronic kidney disease, stage 3 (moderate): Secondary | ICD-10-CM | POA: Diagnosis not present

## 2018-06-01 DIAGNOSIS — Z9181 History of falling: Secondary | ICD-10-CM | POA: Diagnosis not present

## 2018-06-01 DIAGNOSIS — R2681 Unsteadiness on feet: Secondary | ICD-10-CM | POA: Diagnosis not present

## 2018-06-01 DIAGNOSIS — F419 Anxiety disorder, unspecified: Secondary | ICD-10-CM | POA: Diagnosis not present

## 2018-06-01 DIAGNOSIS — I129 Hypertensive chronic kidney disease with stage 1 through stage 4 chronic kidney disease, or unspecified chronic kidney disease: Secondary | ICD-10-CM | POA: Diagnosis not present

## 2018-06-01 DIAGNOSIS — Z792 Long term (current) use of antibiotics: Secondary | ICD-10-CM | POA: Diagnosis not present

## 2018-06-01 DIAGNOSIS — I69398 Other sequelae of cerebral infarction: Secondary | ICD-10-CM | POA: Diagnosis not present

## 2018-06-01 DIAGNOSIS — M6281 Muscle weakness (generalized): Secondary | ICD-10-CM | POA: Diagnosis not present

## 2018-06-01 DIAGNOSIS — E785 Hyperlipidemia, unspecified: Secondary | ICD-10-CM | POA: Diagnosis not present

## 2018-06-01 DIAGNOSIS — N183 Chronic kidney disease, stage 3 (moderate): Secondary | ICD-10-CM | POA: Diagnosis not present

## 2018-06-09 DIAGNOSIS — F419 Anxiety disorder, unspecified: Secondary | ICD-10-CM | POA: Diagnosis not present

## 2018-06-09 DIAGNOSIS — M6281 Muscle weakness (generalized): Secondary | ICD-10-CM | POA: Diagnosis not present

## 2018-06-09 DIAGNOSIS — I129 Hypertensive chronic kidney disease with stage 1 through stage 4 chronic kidney disease, or unspecified chronic kidney disease: Secondary | ICD-10-CM | POA: Diagnosis not present

## 2018-06-09 DIAGNOSIS — I69398 Other sequelae of cerebral infarction: Secondary | ICD-10-CM | POA: Diagnosis not present

## 2018-06-09 DIAGNOSIS — E785 Hyperlipidemia, unspecified: Secondary | ICD-10-CM | POA: Diagnosis not present

## 2018-06-09 DIAGNOSIS — Z792 Long term (current) use of antibiotics: Secondary | ICD-10-CM | POA: Diagnosis not present

## 2018-06-09 DIAGNOSIS — Z9181 History of falling: Secondary | ICD-10-CM | POA: Diagnosis not present

## 2018-06-09 DIAGNOSIS — R2681 Unsteadiness on feet: Secondary | ICD-10-CM | POA: Diagnosis not present

## 2018-06-09 DIAGNOSIS — N183 Chronic kidney disease, stage 3 (moderate): Secondary | ICD-10-CM | POA: Diagnosis not present

## 2018-06-10 DIAGNOSIS — I69398 Other sequelae of cerebral infarction: Secondary | ICD-10-CM | POA: Diagnosis not present

## 2018-06-10 DIAGNOSIS — M6281 Muscle weakness (generalized): Secondary | ICD-10-CM | POA: Diagnosis not present

## 2018-06-10 DIAGNOSIS — I129 Hypertensive chronic kidney disease with stage 1 through stage 4 chronic kidney disease, or unspecified chronic kidney disease: Secondary | ICD-10-CM | POA: Diagnosis not present

## 2018-06-10 DIAGNOSIS — R2681 Unsteadiness on feet: Secondary | ICD-10-CM | POA: Diagnosis not present

## 2018-06-13 DIAGNOSIS — N183 Chronic kidney disease, stage 3 (moderate): Secondary | ICD-10-CM | POA: Diagnosis not present

## 2018-06-13 DIAGNOSIS — I69398 Other sequelae of cerebral infarction: Secondary | ICD-10-CM | POA: Diagnosis not present

## 2018-06-13 DIAGNOSIS — E785 Hyperlipidemia, unspecified: Secondary | ICD-10-CM | POA: Diagnosis not present

## 2018-06-13 DIAGNOSIS — M6281 Muscle weakness (generalized): Secondary | ICD-10-CM | POA: Diagnosis not present

## 2018-06-13 DIAGNOSIS — Z792 Long term (current) use of antibiotics: Secondary | ICD-10-CM | POA: Diagnosis not present

## 2018-06-13 DIAGNOSIS — R2681 Unsteadiness on feet: Secondary | ICD-10-CM | POA: Diagnosis not present

## 2018-06-13 DIAGNOSIS — I129 Hypertensive chronic kidney disease with stage 1 through stage 4 chronic kidney disease, or unspecified chronic kidney disease: Secondary | ICD-10-CM | POA: Diagnosis not present

## 2018-06-13 DIAGNOSIS — F419 Anxiety disorder, unspecified: Secondary | ICD-10-CM | POA: Diagnosis not present

## 2018-06-13 DIAGNOSIS — Z9181 History of falling: Secondary | ICD-10-CM | POA: Diagnosis not present

## 2018-06-20 NOTE — Progress Notes (Deleted)
NEUROLOGY CONSULTATION NOTE  EMARI HREHA MRN: [id number] DOB: [birth year]  Referring provider: Merrilee Seashore, MD (hospital referral) Primary care provider: Jani Gravel, MD  Reason for consult:  stroke  HISTORY OF PRESENT ILLNESS: Toni Gomez is a 85 year old ***-handed woman with hypertension and CKD who presents for stroke.  History supplemented by hospital notes.  Ms. Biever was admitted to [facility] from 03/26/18 to 03/30/18 after she slumped over her chair and lost consciousness while sitting next to her nephew.  Oxygen was saturating in the 50s.  She woke up in the ambulance but was confused.  There was no associated convulsions, incontinence or tongue biting.  CK was elevated with a peak of 1856.  CT of head was personally reviewed and was negative for acute findings.  EKG showed sinus rhythm with right BBB.  MRI of brain was personally reviewed and showed subcentimeter acute infarct in the right cerebellum as well as a punctate subacute infarct in the left frontal and parietal lobe.  MRA of the brain showed no hemodynamically significant stenosis or occlusion.  Carotid Doppler was negative for hemodynamically significant stenosis.  Echocardiogram showed ejection fraction 65 to 70% with no wall motion abnormality, mild AI, TR, MR and mild to moderate left ear.  EEG was normal.  LDL was 118.  Hemoglobin A1c was 5.3.  B12 was 188, TSH 3.619, ammonia 25 and UA negative.  She was treated with IV fluids.  She was started on dual antiplatelet therapy and atorvastatin 20 mg.  PAST MEDICAL HISTORY: Past Medical History:  Diagnosis Date  . Hypertension   . Renal disorder    chronic kidney disease  . Rhabdomyolysis   . Stroke [facility])     PAST SURGICAL HISTORY: No past surgical history on file.  MEDICATIONS: Current Outpatient Medications on File Prior to Visit  Medication Sig Dispense Refill  . amLODipine (NORVASC) 5 MG tablet Take 0.5 tablets (2.5 mg total) by mouth daily. 30  tablet 0  . atorvastatin (LIPITOR) 20 MG tablet Take 1 tablet (20 mg total) by mouth daily at 6 PM. 30 tablet 1  . cefTRIAXone (ROCEPHIN) 1 g injection Inject 1 g into the muscle once. A day x 3 days    . clopidogrel (PLAVIX) 75 MG tablet Take 1 tablet (75 mg total) by mouth daily. 30 tablet 0  . dextromethorphan (DELSYM) 30 MG/5ML liquid Take 5 mLs by mouth every 6 (six) hours as needed for cough.     Marland Kitchen ipratropium-albuterol (DUONEB) 0.5-2.5 (3) MG/3ML SOLN Take 3 mLs by nebulization every 6 (six) hours as needed.    Marland Kitchen levofloxacin (LEVAQUIN) 500 MG tablet Take 500 mg by mouth daily.    . Loratadine (CLARITIN) 10 MG CAPS Take 10 mg by mouth daily.    . polyethylene glycol (MIRALAX / GLYCOLAX) packet Take 17 g by mouth daily as needed.     No current facility-administered medications on file prior to visit.     ALLERGIES: No Known Allergies  FAMILY HISTORY: Family History  Problem Relation Age of Onset  . Hypertension Other    SOCIAL HISTORY: Social History   Socioeconomic History  . Marital status: Widowed    Spouse name: Not on file  . Number of children: Not on file  . Years of education: Not on file  . Highest education level: Not on file  Occupational History  . Not on file  Social Needs  . Financial resource strain: Patient refused  . Food insecurity:  Worry: Patient refused    Inability: Patient refused  . Transportation needs:    Medical: Patient refused    Non-medical: Patient refused  Tobacco Use  . Smoking status: Never Smoker  . Smokeless tobacco: Never Used  Substance and Sexual Activity  . Alcohol use: No  . Drug use: No  . Sexual activity: Not Currently  Lifestyle  . Physical activity:    Days per week: Patient refused    Minutes per session: Patient refused  . Stress: Patient refused  Relationships  . Social connections:    Talks on phone: Patient refused    Gets together: Patient refused    Attends religious service: Patient refused     Active member of club or organization: Patient refused    Attends meetings of clubs or organizations: Patient refused    Relationship status: Patient refused  . Intimate partner violence:    Fear of current or ex partner: Patient refused    Emotionally abused: Patient refused    Physically abused: Patient refused    Forced sexual activity: Patient refused  Other Topics Concern  . Not on file  Social History Narrative  . Not on file    REVIEW OF SYSTEMS: Constitutional: No fevers, chills, or sweats, no generalized fatigue, change in appetite Eyes: No visual changes, double vision, eye pain Ear, nose and throat: No hearing loss, ear pain, nasal congestion, sore throat Cardiovascular: No chest pain, palpitations Respiratory:  No shortness of breath at rest or with exertion, wheezes GastrointestinaI: No nausea, vomiting, diarrhea, abdominal pain, fecal incontinence Genitourinary:  No dysuria, urinary retention or frequency Musculoskeletal:  No neck pain, back pain Integumentary: No rash, pruritus, skin lesions Neurological: as above Psychiatric: No depression, insomnia, anxiety Endocrine: No palpitations, fatigue, diaphoresis, mood swings, change in appetite, change in weight, increased thirst Hematologic/Lymphatic:  No purpura, petechiae. Allergic/Immunologic: no itchy/runny eyes, nasal congestion, recent allergic reactions, rashes  PHYSICAL EXAM: *** General: No acute distress.  Patient appears ***-groomed.  *** Head:  Normocephalic/atraumatic Eyes:  fundi examined but not visualized Neck: supple, no paraspinal tenderness, full range of motion Back: No paraspinal tenderness Heart: regular rate and rhythm Lungs: Clear to auscultation bilaterally. Vascular: No carotid bruits. Neurological Exam: Mental status: alert and oriented to person, place, and time, recent and remote memory intact, fund of knowledge intact, attention and concentration intact, speech fluent and not  dysarthric, language intact. Cranial nerves: CN I: not tested CN II: pupils equal, round and reactive to light, visual fields intact CN III, IV, VI:  full range of motion, no nystagmus, no ptosis CN V: facial sensation intact CN VII: upper and lower face symmetric CN VIII: hearing intact CN IX, X: gag intact, uvula midline CN XI: sternocleidomastoid and trapezius muscles intact CN XII: tongue midline Bulk & Tone: normal, no fasciculations. Motor:  5/5 throughout *** Sensation:  Pinprick *** temperature *** and vibration sensation intact.  ***. Deep Tendon Reflexes:  2+ throughout, *** toes downgoing.  *** Finger to nose testing:  Without dysmetria.  *** Heel to shin:  Without dysmetria.  *** Gait:  Normal station and stride.  Able to turn and tandem walk. Romberg ***.  IMPRESSION: ***  PLAN: ***  Thank you for allowing me to take part in the care of this patient.  Metta Clines, DO  CC: ***

## 2018-06-21 ENCOUNTER — Ambulatory Visit: Payer: Medicare HMO | Admitting: Neurology

## 2018-06-21 DIAGNOSIS — I10 Essential (primary) hypertension: Secondary | ICD-10-CM | POA: Diagnosis not present

## 2018-06-23 DIAGNOSIS — M6281 Muscle weakness (generalized): Secondary | ICD-10-CM | POA: Diagnosis not present

## 2018-06-23 DIAGNOSIS — R2681 Unsteadiness on feet: Secondary | ICD-10-CM | POA: Diagnosis not present

## 2018-06-23 DIAGNOSIS — I69398 Other sequelae of cerebral infarction: Secondary | ICD-10-CM | POA: Diagnosis not present

## 2018-06-23 DIAGNOSIS — I129 Hypertensive chronic kidney disease with stage 1 through stage 4 chronic kidney disease, or unspecified chronic kidney disease: Secondary | ICD-10-CM | POA: Diagnosis not present

## 2018-06-23 DIAGNOSIS — Z792 Long term (current) use of antibiotics: Secondary | ICD-10-CM | POA: Diagnosis not present

## 2018-06-23 DIAGNOSIS — N183 Chronic kidney disease, stage 3 (moderate): Secondary | ICD-10-CM | POA: Diagnosis not present

## 2018-06-23 DIAGNOSIS — Z9181 History of falling: Secondary | ICD-10-CM | POA: Diagnosis not present

## 2018-06-23 DIAGNOSIS — E785 Hyperlipidemia, unspecified: Secondary | ICD-10-CM | POA: Diagnosis not present

## 2018-06-27 DIAGNOSIS — Z792 Long term (current) use of antibiotics: Secondary | ICD-10-CM | POA: Diagnosis not present

## 2018-06-27 DIAGNOSIS — Z9181 History of falling: Secondary | ICD-10-CM | POA: Diagnosis not present

## 2018-06-27 DIAGNOSIS — E785 Hyperlipidemia, unspecified: Secondary | ICD-10-CM | POA: Diagnosis not present

## 2018-06-27 DIAGNOSIS — I129 Hypertensive chronic kidney disease with stage 1 through stage 4 chronic kidney disease, or unspecified chronic kidney disease: Secondary | ICD-10-CM | POA: Diagnosis not present

## 2018-06-27 DIAGNOSIS — N183 Chronic kidney disease, stage 3 (moderate): Secondary | ICD-10-CM | POA: Diagnosis not present

## 2018-06-27 DIAGNOSIS — R2681 Unsteadiness on feet: Secondary | ICD-10-CM | POA: Diagnosis not present

## 2018-06-27 DIAGNOSIS — M6281 Muscle weakness (generalized): Secondary | ICD-10-CM | POA: Diagnosis not present

## 2018-06-27 DIAGNOSIS — I69398 Other sequelae of cerebral infarction: Secondary | ICD-10-CM | POA: Diagnosis not present

## 2018-07-04 DIAGNOSIS — E785 Hyperlipidemia, unspecified: Secondary | ICD-10-CM | POA: Diagnosis not present

## 2018-07-04 DIAGNOSIS — I129 Hypertensive chronic kidney disease with stage 1 through stage 4 chronic kidney disease, or unspecified chronic kidney disease: Secondary | ICD-10-CM | POA: Diagnosis not present

## 2018-07-04 DIAGNOSIS — Z9181 History of falling: Secondary | ICD-10-CM | POA: Diagnosis not present

## 2018-07-04 DIAGNOSIS — M6281 Muscle weakness (generalized): Secondary | ICD-10-CM | POA: Diagnosis not present

## 2018-07-04 DIAGNOSIS — R2681 Unsteadiness on feet: Secondary | ICD-10-CM | POA: Diagnosis not present

## 2018-07-04 DIAGNOSIS — I69398 Other sequelae of cerebral infarction: Secondary | ICD-10-CM | POA: Diagnosis not present

## 2018-07-04 DIAGNOSIS — Z792 Long term (current) use of antibiotics: Secondary | ICD-10-CM | POA: Diagnosis not present

## 2018-07-04 DIAGNOSIS — N183 Chronic kidney disease, stage 3 (moderate): Secondary | ICD-10-CM | POA: Diagnosis not present

## 2018-07-05 DIAGNOSIS — B351 Tinea unguium: Secondary | ICD-10-CM | POA: Diagnosis not present

## 2018-07-05 DIAGNOSIS — M79676 Pain in unspecified toe(s): Secondary | ICD-10-CM | POA: Diagnosis not present

## 2018-07-25 ENCOUNTER — Encounter: Payer: Self-pay | Admitting: Family Medicine

## 2018-07-25 ENCOUNTER — Ambulatory Visit (INDEPENDENT_AMBULATORY_CARE_PROVIDER_SITE_OTHER): Payer: Medicare Other | Admitting: Family Medicine

## 2018-07-25 VITALS — BP 138/58 | HR 73 | Temp 96.9°F | Ht 61.0 in | Wt 151.0 lb

## 2018-07-25 DIAGNOSIS — Z8673 Personal history of transient ischemic attack (TIA), and cerebral infarction without residual deficits: Secondary | ICD-10-CM | POA: Diagnosis not present

## 2018-07-25 DIAGNOSIS — N183 Chronic kidney disease, stage 3 unspecified: Secondary | ICD-10-CM

## 2018-07-25 DIAGNOSIS — Z7689 Persons encountering health services in other specified circumstances: Secondary | ICD-10-CM | POA: Diagnosis not present

## 2018-07-25 DIAGNOSIS — I10 Essential (primary) hypertension: Secondary | ICD-10-CM

## 2018-07-25 DIAGNOSIS — H1013 Acute atopic conjunctivitis, bilateral: Secondary | ICD-10-CM | POA: Diagnosis not present

## 2018-07-25 MED ORDER — OLOPATADINE HCL 0.1 % OP SOLN: 1.0000 [drp] | Freq: Two times a day (BID) | OPHTHALMIC | 12 refills | Status: DC

## 2018-07-25 NOTE — Progress Notes (Signed)
Subjective: [id number] care, hx CVA; HTN, CKD HPI: Toni Gomez is a 85 y.o. female presenting to clinic today for:  1. Hypertension w/ CKD3 Patient reports BPs at home fluctuate (she was told it was somewhat high by nurses in the past but does not know what the measurement was); Meds: Compliant with Norvasc 5mg  daily, Side effects: none  She has a hx CVA in 03/2018.  The MRI showed a subcentimeter acute infarct of the right cerebellum with punctate subacute infarct in the left frontal parietal lobe.  She had an echocardiogram which noted mild aortic insufficiency, tricuspid regurgitation and mitral valve regurgitation with mild to moderate aortic stenosis.  Cardiology was consulted and there was no further work-up recommended.  She reports that she was discharged to a short-term nursing facility to rehabilitate.  She was discharged on aspirin and Plavix x3 weeks then Plavix alone.  She was also discharged on Lipitor 20 mg.  She notes that she has not continued the Lipitor after the first month refill that was provided from the hospital.  She is not sure why.  Denies issues with statin.  ROS: Denies headache, dizziness, chest pain, LE swelling, abdominal pain or shortness of breath.    2. Hx CVA History of CVA in November 2019 as above.  She uses a walker or wheelchair for ambulation.  She does report sensation of instability sometimes with walker but denies any recent falls.  3.  Osteoarthritis/chronic knee pain Patient with known history of osteoarthritis and chronic knee pain.  She has history of bilateral knee replacements in the past.  She was prescribed meloxicam from an unknown provider.  She does take this for arthritis but was totally unaware that this would impact her kidney function or increase her risk of stroke and heart attack.  She does not want to continue this medication and will instead use topicals or scheduled Tylenol.  4.  Eye allergies Patient reports daily eye  itching, watering and mild redness.  She takes an oral Claritin but does not find that this is especially helpful.  She is wondering what else she can do.  Past Medical History:  Diagnosis Date  . Elevated troponin 03/26/2018  . Hypertension   . Renal disorder    chronic kidney disease  . Rhabdomyolysis   . Stroke [facility])    No past surgical history on file. Social History   Socioeconomic History  . Marital status: Widowed    Spouse name: Not on file  . Number of children: Not on file  . Years of education: Not on file  . Highest education level: Not on file  Occupational History  . Not on file  Social Needs  . Financial resource strain: Patient refused  . Food insecurity:    Worry: Patient refused    Inability: Patient refused  . Transportation needs:    Medical: Patient refused    Non-medical: Patient refused  Tobacco Use  . Smoking status: Never Smoker  . Smokeless tobacco: Never Used  Substance and Sexual Activity  . Alcohol use: No  . Drug use: No  . Sexual activity: Not Currently  Lifestyle  . Physical activity:    Days per week: Patient refused    Minutes per session: Patient refused  . Stress: Patient refused  Relationships  . Social connections:    Talks on phone: Patient refused    Gets together: Patient refused    Attends religious service: Patient refused    Active member of club  or organization: Patient refused    Attends meetings of clubs or organizations: Patient refused    Relationship status: Patient refused  . Intimate partner violence:    Fear of current or ex partner: Patient refused    Emotionally abused: Patient refused    Physically abused: Patient refused    Forced sexual activity: Patient refused  Other Topics Concern  . Not on file  Social History Narrative  . Not on file   Current Meds  Medication Sig  . amLODipine (NORVASC) 5 MG tablet Take 0.5 tablets (2.5 mg total) by mouth daily.  . clopidogrel (PLAVIX) 75 MG tablet Take 1  tablet (75 mg total) by mouth daily.  . meloxicam (MOBIC) 7.5 MG tablet Take 7.5 mg by mouth daily.   Family History  Problem Relation Age of Onset  . Hypertension Other    No Known Allergies   ROS: Per HPI  Objective: Office vital signs reviewed. BP (!) 138/58   Pulse 73   Temp (!) 96.9 F (36.1 C) (Oral)   Ht 5\' 1"  (1.549 m)   Wt 151 lb (68.5 kg)   BMI 28.53 kg/m   Physical Examination:  General: Awake, alert, well nourished, No acute distress HEENT: Normal, sclera pink with mild tearing, slightly asymmetric rise of palate (slightly down on left) Cardio: regular rate and rhythm, S1S2 heard, soft systolic murmur appreciated Pulm: clear to auscultation bilaterally, no wheezes, rhonchi or rales; normal work of breathing on room sir Extremities: warm, well perfused, No edema, cyanosis or clubbing; +2 pulses bilaterally MSK: slow gait and hunched station, uses rolling walker for ambulation. Poor core strength. Has to use UE to get out of chair  Assessment/ Plan: 85 y.o. female   1. Essential hypertension Blood pressure at goal for age.  Continue Norvasc 5 mg daily.  We discussed transitioning over to 5 mg tablets when she needs refills.  She is currently doubling up on 2.5 mg tablets. - Basic Metabolic Panel  2. Establishing care with new doctor, encounter for Awaiting records from previous PCP.  I reviewed her hospitalization discharge summary from November 2019.  3. CKD (chronic kidney disease) stage 3, GFR 30-59 ml/min (HCC) We will recheck creatinine today.  I have advised her to totally discontinue the meloxicam.  Unsure as to why she is not on an ACE inhibitor or ARB given known CKD.  Again, I will await her previous records.  We discussed avoiding NSAIDs totally to reduce risk of recurrent cardiovascular events and worsening renal disease. - Basic Metabolic Panel  4. History of stroke Ambulates with the use of a walker.  She does have some instability and poor core  strength noted on exam.  Could consider outpatient physical therapy at some point if patient desires.  5. Allergic conjunctivitis of both eyes Refractory to Claritin orally.  Start Patanol eyedrops.   [name], DO [place] [phone]

## 2018-07-25 NOTE — Patient Instructions (Signed)
I am checking your kidney function today.  I DO NOT WANT YOU TAKING THE MELOXICAM or Motrin/ Advil/ Ibuprofen/ Aleve/ Naproxen.  These are BAD for kidneys and can increase your risk of stroke and heart attack.  You CAN TAKE Tylenol every 8 hours if needed for pain/ arthritis.  OK to use any topical cream (icyhot, biofreeze, etc)  I think you should be a on a cholesterol medicine and am not sure why they stopped it.  I will work on getting your records and contact you about that.  I sent in an eye drop today for your allergies.  Use 1 drop in each eye up to 2 times a day.  See me in 3 months for recheck.

## 2018-07-26 ENCOUNTER — Telehealth: Payer: Self-pay | Admitting: Family Medicine

## 2018-07-26 LAB — BASIC METABOLIC PANEL
BUN/Creatinine Ratio: 15 (ref 12–28)
BUN: 22 mg/dL (ref 10–36)
CO2: 22 mmol/L (ref 20–29)
Calcium: 10.1 mg/dL (ref 8.7–10.3)
Chloride: 103 mmol/L (ref 96–106)
Creatinine, Ser: 1.47 mg/dL — ABNORMAL HIGH (ref 0.57–1.00)
GFR calc Af Amer: 35 mL/min/{1.73_m2} — ABNORMAL LOW (ref >59)
GFR calc non Af Amer: 31 mL/min/{1.73_m2} — ABNORMAL LOW (ref >59)
Glucose: 88 mg/dL (ref 65–99)
Potassium: 4.9 mmol/L (ref 3.5–5.2)
Sodium: 141 mmol/L (ref 134–144)

## 2018-07-26 NOTE — Telephone Encounter (Signed)
[name] aware of patient's results

## 2018-08-22 ENCOUNTER — Telehealth: Payer: Self-pay | Admitting: *Deleted

## 2018-08-22 NOTE — Telephone Encounter (Signed)
Pt fell coming into practice Safety zone portal entered in Epic No injury reported Pt came in to office after receiving call from "Dr in [place]" stating she has appt for labs tomorrow Pt wanted to have labs here reviewed pt's chart but could not determine who had contacted pt Attempted to call [facility], previous PCP, to see if they had called pt Unable to get through Pt informed will check with Dr Darnell Level to see if pt needs labs before next appt in June Please advise

## 2018-08-23 NOTE — Telephone Encounter (Signed)
Pt notified and will keep June appt Pt is not having any problems from fall yesterday

## 2018-08-23 NOTE — Telephone Encounter (Signed)
I am unaware of any labs needed before appt.

## 2018-09-13 DIAGNOSIS — I70203 Unspecified atherosclerosis of native arteries of extremities, bilateral legs: Secondary | ICD-10-CM | POA: Diagnosis not present

## 2018-09-13 DIAGNOSIS — B351 Tinea unguium: Secondary | ICD-10-CM | POA: Diagnosis not present

## 2018-09-13 DIAGNOSIS — L84 Corns and callosities: Secondary | ICD-10-CM | POA: Diagnosis not present

## 2018-09-13 DIAGNOSIS — M79676 Pain in unspecified toe(s): Secondary | ICD-10-CM | POA: Diagnosis not present

## 2018-10-12 ENCOUNTER — Other Ambulatory Visit: Payer: Self-pay

## 2018-10-12 ENCOUNTER — Ambulatory Visit (INDEPENDENT_AMBULATORY_CARE_PROVIDER_SITE_OTHER): Payer: Medicare Other

## 2018-10-12 DIAGNOSIS — Z0000 Encounter for general adult medical examination without abnormal findings: Secondary | ICD-10-CM | POA: Diagnosis not present

## 2018-10-12 NOTE — Progress Notes (Signed)
MEDICARE ANNUAL WELLNESS VISIT  10/12/2018  Telephone Visit Disclaimer This Medicare AWV was conducted by telephone due to national recommendations for restrictions regarding the COVID-19 Pandemic (e.g. social distancing).  I verified, using two identifiers, that I am speaking with Toni Gomez or their authorized healthcare agent. I discussed the limitations, risks, security, and privacy concerns of performing an evaluation and management service by telephone and the potential availability of an in-person appointment in the future. The patient expressed understanding and agreed to proceed.   Subjective:  Toni Gomez is a 85 y.o. female patient of [name], DO who had a Medicare Annual Wellness Visit today via telephone. Lonie is Retired and lives alone. she has 0 children. she reports that she is socially active and does interact with friends/family regularly. she is minimally physically active. 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 Her husband passed away 21 years ago and she has lived alone ever since. The couple never had children because her Dr told her she would need to have a surgery first before she could have them and she never had it done.We reviewed her health maintenance and she declines several things due at this time.   Patient Care Team: [name], DO as PCP - General (Family Medicine) Herminio Commons, MD as PCP - Cardiology (Cardiology)  Advanced Directives 10/12/2018 05/23/2018 05/05/2018 05/03/2018 05/02/2018 05/02/2018 04/26/2018  Does Patient Have a Medical Advance Directive? No No Yes Yes Yes Yes Yes  Type of Advance Directive - [place];Living will (No Data) (No Data) - (No Data) (No Data)  Does patient want to make changes to medical advance directive? - No - Patient declined No - Patient declined No - Patient declined No - Patient declined No - Patient declined No - Patient declined   Copy of [place] in Chart? - No - copy requested - - - - -  Would patient like information on creating a medical advance directive? No - Patient declined - No - Patient declined No - Patient declined No - Patient declined No - Patient declined No - Patient declined    Hospital Utilization Over the Past 12 Months: # of hospitalizations or ER visits: 0 # of surgeries: 0  Review of Systems    Patient reports that her overall health is unchanged compared to last year.  Patient Reported Readings (BP, Pulse, CBG, Weight, etc)   Review of Systems: History obtained from chart review  All other systems negative.  Pain Assessment Pain : No/denies pain     Current Medications & Allergies (verified) Allergies as of 10/12/2018   No Known Allergies     Medication List       Accurate as of Oct 12, 2018  3:52 PM. If you have any questions, ask your nurse or doctor.        amLODipine 5 MG tablet Commonly known as:  NORVASC Take 0.5 tablets (2.5 mg total) by mouth daily.   clopidogrel 75 MG tablet Commonly known as:  PLAVIX Take 1 tablet (75 mg total) by mouth daily.   meloxicam 7.5 MG tablet Commonly known as:  MOBIC Take 7.5 mg by mouth daily.   olopatadine 0.1 % ophthalmic solution Commonly known as:  PATANOL Place 1 drop into both eyes 2 (two) times daily. (for eye allergies)       History (reviewed): Past Medical History:  Diagnosis Date  . Elevated troponin 03/26/2018  .  Hyperlipidemia   . Hypertension   . Renal disorder    chronic kidney disease  . Rhabdomyolysis   . Stroke [facility])    Past Surgical History:  Procedure Laterality Date  . BREAST SURGERY  1980   R breast removed, not cancer   Family History  Problem Relation Age of Onset  . Hypertension Other   . Asthma Father    Social History   Socioeconomic History  . Marital status: Widowed    Spouse name: Not on file  . Number of children: 0  . Years of education: Not on file   . Highest education level: 7th grade  Occupational History  . Occupation: retired  Scientific laboratory technician  . Financial resource strain: Not hard at all  . Food insecurity:    Worry: Never true    Inability: Never true  . Transportation needs:    Medical: No    Non-medical: No  Tobacco Use  . Smoking status: Never Smoker  . Smokeless tobacco: Never Used  Substance and Sexual Activity  . Alcohol use: No  . Drug use: No  . Sexual activity: Not Currently  Lifestyle  . Physical activity:    Days per week: 3 days    Minutes per session: 30 min  . Stress: Not at all  Relationships  . Social connections:    Talks on phone: More than three times a week    Gets together: More than three times a week    Attends religious service: More than 4 times per year    Active member of club or organization: No    Attends meetings of clubs or organizations: Never    Relationship status: Widowed  Other Topics Concern  . Not on file  Social History Narrative  . Not on file    Activities of Daily Living In your present state of health, do you have any difficulty performing the following activities: 10/12/2018 03/27/2018  Hearing? Y Y  Comment Minimal difficulty -  Vision? Y N  Difficulty concentrating or making decisions? N Y  Walking or climbing stairs? N Y  Dressing or bathing? N N  Doing errands, shopping? N N  Preparing Food and eating ? N -  Using the Toilet? N -  In the past six months, have you accidently leaked urine? N -  Do you have problems with loss of bowel control? N -  Managing your Medications? N -  Managing your Finances? N -  Housekeeping or managing your Housekeeping? N -  Some recent data might be hidden    Patient Literacy How often do you need to have someone help you when you read instructions, pamphlets, or other written materials from your doctor or pharmacy?: 1 - Never What is the last grade level you completed in school?: 7th grade  Exercise Current Exercise  Habits: The patient does not participate in regular exercise at present, Exercise limited by: None identified  Diet Patient reports consuming 3 meals a day and 2 snack(s) a day Patient reports that her primary diet is: Regular Patient reports that she does have regular access to food.   Depression Screen PHQ 2/9 Scores 07/25/2018  PHQ - 2 Score 0  PHQ- 9 Score 0     Fall Risk Fall Risk  07/25/2018  Falls in the past year? 0     Objective:  Toni Gomez seemed alert and oriented and she participated appropriately during our telephone visit.  Blood Pressure Weight BMI  BP Readings  from Last 3 Encounters:  07/25/18 (!) 138/58  05/05/18 134/72  05/03/18 (!) 142/64   Wt Readings from Last 3 Encounters:  07/25/18 151 lb (68.5 kg)  05/02/18 160 lb (72.6 kg)  03/27/18 150 lb 12.7 oz (68.4 kg)   BMI Readings from Last 1 Encounters:  07/25/18 28.53 kg/m    *Unable to obtain current vital signs, weight, and BMI due to telephone visit type  Hearing/Vision  . Jatoya did  seem to have difficulty with hearing/understanding during the telephone conversation . Reports that she has not had a formal eye exam by an eye care professional within the past year . Reports that she has not had a formal hearing evaluation within the past year *Unable to fully assess hearing and vision during telephone visit type  Cognitive Function: 6CIT Screen 10/12/2018  What Year? 0 points  What month? 0 points  What time? 0 points  Count back from 20 0 points  Months in reverse 0 points  Repeat phrase 0 points  Total Score 0    Normal Cognitive Function Screening: Yes (Normal:0-7, Significant for Dysfunction: >8)  Immunization & Health Maintenance Record  There is no immunization history on file for this patient.  Health Maintenance  Topic Date Due  . FOOT EXAM  07/16/1933  . OPHTHALMOLOGY EXAM  07/16/1933  . URINE MICROALBUMIN  07/16/1933  . TETANUS/TDAP  07/15/1944  . DEXA SCAN  07/15/1990   . PNA vac Low Risk Adult (1 of 2 - PCV13) 07/15/1990  . HEMOGLOBIN A1C  09/25/2018  . INFLUENZA VACCINE  12/17/2018       Assessment  This is a routine wellness examination for KONI KANNAN.  Health Maintenance: Due or Overdue Health Maintenance Due  Topic Date Due  . FOOT EXAM  07[birth year]16/1933  . OPHTHALMOLOGY EXAM  07/16/1933  . URINE MICROALBUMIN  07/16/1935  . TETANUS/TDAP  07/15/1944  . DEXA SCAN  07/15/1990  . PNA vac Low Risk Adult (1 of 2 - PCV13) 07/15/1990  . HEMOGLOBIN A1C  09/25/2018    Toni Gomez does not need a referral for Community Assistance: Care Management:   no Social Work:    no Prescription Assistance:  no Nutrition/Diabetes Education:  no   Plan:  Personalized Goals Goals Addressed            This Visit's Progress   . Exercise 150 min/wk Moderate Activity      . Have 3 meals a day        Personalized Health Maintenance & Screening Recommendations    Lung Cancer Screening Recommended: no (Low Dose CT Chest recommended if Age 27-80 years, 30 pack-year currently smoking OR have quit w/in past 15 years) Hepatitis C Screening recommended: not applicable HIV Screening recommended: no  Advanced Directives: Written information was not prepared per patient's request.  Referrals & Orders No orders of the defined types were placed in this encounter.   Follow-up Plan . Follow-up with [name], DO as planned   I have personally reviewed and noted the following in the patient's chart:   . Medical and social history . Use of alcohol, tobacco or illicit drugs  . Current medications and supplements . Functional ability and status . Nutritional status . Physical activity . Advanced directives . List of other physicians . Hospitalizations, surgeries, and ER visits in previous 12 months . Vitals . Screenings to include cognitive, depression, and falls . Referrals and appointments  In addition, I have reviewed and discussed with  Toni Gomez certain preventive protocols, quality metrics, and best practice recommendations. A written personalized care plan for preventive services as well as general preventive health recommendations is available and can be mailed to the patient at her request.      Rolena Infante  10/12/2018

## 2018-10-25 ENCOUNTER — Ambulatory Visit (INDEPENDENT_AMBULATORY_CARE_PROVIDER_SITE_OTHER): Payer: Medicare Other | Admitting: Family Medicine

## 2018-10-25 ENCOUNTER — Other Ambulatory Visit: Payer: Self-pay

## 2018-10-25 DIAGNOSIS — I10 Essential (primary) hypertension: Secondary | ICD-10-CM | POA: Diagnosis not present

## 2018-10-25 DIAGNOSIS — R630 Anorexia: Secondary | ICD-10-CM | POA: Diagnosis not present

## 2018-10-25 DIAGNOSIS — Z8673 Personal history of transient ischemic attack (TIA), and cerebral infarction without residual deficits: Secondary | ICD-10-CM | POA: Diagnosis not present

## 2018-10-25 DIAGNOSIS — R5383 Other fatigue: Secondary | ICD-10-CM

## 2018-10-25 MED ORDER — AMLODIPINE BESYLATE 5 MG PO TABS: 5.0000 mg | ORAL_TABLET | Freq: Every day | ORAL | 0 refills | Status: DC

## 2018-10-25 NOTE — Progress Notes (Signed)
Telephone visit  Subjective: CC: Follow-up hypertension PCP: [name], DO [id number] Toni Gomez is a 85 y.o. female calls for telephone consult today. Patient provides verbal consent for consult held via phone.  Location of patient: Home Location of provider: [facility] Others present for call: None  1.  Hypertension Patient reports compliance with Norvasc 5 mg daily.  No chest pain, shortness of breath, dizziness.  She does report low appetite and low energy that is been ongoing for several months.  She often will eat breakfast but have no appetite throughout the day.  She is wondering if she should start a multivitamin.  Denies early satiety, unplanned weight loss or night sweats.  Additionally, she reports that she discontinue the Plavix because she felt that it was causing her hoarseness.  She is feeling better since discontinuing the Plavix.   ROS: Per HPI  No Known Allergies Past Medical History:  Diagnosis Date  . Elevated troponin 03/26/2018  . Hyperlipidemia   . Hypertension   . Renal disorder    chronic kidney disease  . Rhabdomyolysis   . Stroke [facility])     Current Outpatient Medications:  .  amLODipine (NORVASC) 5 MG tablet, Take 0.5 tablets (2.5 mg total) by mouth daily., Disp: 30 tablet, Rfl: 0 .  clopidogrel (PLAVIX) 75 MG tablet, Take 1 tablet (75 mg total) by mouth daily., Disp: 30 tablet, Rfl: 0 .  meloxicam (MOBIC) 7.5 MG tablet, Take 7.5 mg by mouth daily., Disp: , Rfl:  .  olopatadine (PATANOL) 0.1 % ophthalmic solution, Place 1 drop into both eyes 2 (two) times daily. (for eye allergies), Disp: 5 mL, Rfl: 12  Assessment/ Plan: 85 y.o. female   1. Essential hypertension Doing well with 5 mg daily.  I have changed her pill dose to reflect what she is taking at home.  I advised her to only take 1 tablet of the 5 mg.  She was able to repeat what her daily dosage is for the amlodipine.  I have scheduled a 4-week follow-up with me in the office so that we may  repeat renal function tests given reports of low energy and poor appetite.  I have advised her to go ahead and start a multivitamin.  She is to see me back if she is feeling any worse or develops any worrisome symptoms or signs. - amLODipine (NORVASC) 5 MG tablet; Take 1 tablet (5 mg total) by mouth daily.  Dispense: 90 tablet; Refill: 0  2. History of stroke She discontinued Plavix.  She understands that this may increase her risk of stroke.  3. Poor appetite I recommended scheduling meals and snacks.  Plan for PHQ-9 when she comes into the office.  If we find that there is an underlying depression may need to consider adding mirtazapine  4. Low energy As above   Start time: 10:16am End time: 10:32am  Total time spent on patient care (including telephone call/ virtual visit): 17 minutes  [place], [place] [phone]

## 2018-10-29 ENCOUNTER — Encounter (HOSPITAL_COMMUNITY): Payer: Self-pay | Admitting: Pharmacy Technician

## 2018-10-29 ENCOUNTER — Emergency Department (HOSPITAL_COMMUNITY): Payer: Medicare Other

## 2018-10-29 ENCOUNTER — Other Ambulatory Visit: Payer: Self-pay

## 2018-10-29 ENCOUNTER — Inpatient Hospital Stay (HOSPITAL_COMMUNITY)
Admission: EM | Admit: 2018-10-29 | Discharge: 2018-10-30 | DRG: 065 | Disposition: A | Discharge status: Home Health | Payer: Medicare Other | Attending: Internal Medicine | Admitting: Internal Medicine

## 2018-10-29 DIAGNOSIS — I6389 Other cerebral infarction: Secondary | ICD-10-CM | POA: Diagnosis not present

## 2018-10-29 DIAGNOSIS — N183 Chronic kidney disease, stage 3 unspecified: Secondary | ICD-10-CM | POA: Diagnosis present

## 2018-10-29 DIAGNOSIS — Z8673 Personal history of transient ischemic attack (TIA), and cerebral infarction without residual deficits: Secondary | ICD-10-CM | POA: Diagnosis not present

## 2018-10-29 DIAGNOSIS — E785 Hyperlipidemia, unspecified: Secondary | ICD-10-CM | POA: Diagnosis not present

## 2018-10-29 DIAGNOSIS — I129 Hypertensive chronic kidney disease with stage 1 through stage 4 chronic kidney disease, or unspecified chronic kidney disease: Secondary | ICD-10-CM | POA: Diagnosis not present

## 2018-10-29 DIAGNOSIS — Z825 Family history of asthma and other chronic lower respiratory diseases: Secondary | ICD-10-CM

## 2018-10-29 DIAGNOSIS — I639 Cerebral infarction, unspecified: Secondary | ICD-10-CM | POA: Diagnosis not present

## 2018-10-29 DIAGNOSIS — Z791 Long term (current) use of non-steroidal anti-inflammatories (NSAID): Secondary | ICD-10-CM

## 2018-10-29 DIAGNOSIS — R4781 Slurred speech: Secondary | ICD-10-CM | POA: Diagnosis not present

## 2018-10-29 DIAGNOSIS — Z1159 Encounter for screening for other viral diseases: Secondary | ICD-10-CM | POA: Diagnosis not present

## 2018-10-29 DIAGNOSIS — Z8249 Family history of ischemic heart disease and other diseases of the circulatory system: Secondary | ICD-10-CM | POA: Diagnosis not present

## 2018-10-29 DIAGNOSIS — R29810 Facial weakness: Secondary | ICD-10-CM | POA: Diagnosis present

## 2018-10-29 DIAGNOSIS — I351 Nonrheumatic aortic (valve) insufficiency: Secondary | ICD-10-CM | POA: Diagnosis not present

## 2018-10-29 DIAGNOSIS — Z66 Do not resuscitate: Secondary | ICD-10-CM | POA: Diagnosis not present

## 2018-10-29 DIAGNOSIS — E7849 Other hyperlipidemia: Secondary | ICD-10-CM | POA: Diagnosis not present

## 2018-10-29 DIAGNOSIS — G458 Other transient cerebral ischemic attacks and related syndromes: Secondary | ICD-10-CM | POA: Diagnosis present

## 2018-10-29 DIAGNOSIS — Z79899 Other long term (current) drug therapy: Secondary | ICD-10-CM | POA: Diagnosis not present

## 2018-10-29 DIAGNOSIS — I361 Nonrheumatic tricuspid (valve) insufficiency: Secondary | ICD-10-CM | POA: Diagnosis not present

## 2018-10-29 DIAGNOSIS — I10 Essential (primary) hypertension: Secondary | ICD-10-CM | POA: Diagnosis not present

## 2018-10-29 DIAGNOSIS — Z888 Allergy status to other drugs, medicaments and biological substances status: Secondary | ICD-10-CM | POA: Diagnosis not present

## 2018-10-29 DIAGNOSIS — Z03818 Encounter for observation for suspected exposure to other biological agents ruled out: Secondary | ICD-10-CM | POA: Diagnosis not present

## 2018-10-29 DIAGNOSIS — I63511 Cerebral infarction due to unspecified occlusion or stenosis of right middle cerebral artery: Secondary | ICD-10-CM

## 2018-10-29 LAB — URINALYSIS, ROUTINE W REFLEX MICROSCOPIC
Bacteria, UA: NONE SEEN
Bilirubin Urine: NEGATIVE
Glucose, UA: NEGATIVE mg/dL
Hgb urine dipstick: NEGATIVE
Ketones, ur: NEGATIVE mg/dL
Leukocytes,Ua: NEGATIVE
Nitrite: NEGATIVE
Protein, ur: 30 mg/dL — AB
Specific Gravity, Urine: 1.006 (ref 1.005–1.030)
pH: 6 (ref 5.0–8.0)

## 2018-10-29 LAB — COMPREHENSIVE METABOLIC PANEL
ALT: 11 U/L (ref 0–44)
AST: 19 U/L (ref 15–41)
Albumin: 3.9 g/dL (ref 3.5–5.0)
Alkaline Phosphatase: 64 U/L (ref 38–126)
Anion gap: 8 (ref 5–15)
BUN: 18 mg/dL (ref 8–23)
CO2: 23 mmol/L (ref 22–32)
Calcium: 10.5 mg/dL — ABNORMAL HIGH (ref 8.9–10.3)
Chloride: 108 mmol/L (ref 98–111)
Creatinine, Ser: 1.3 mg/dL — ABNORMAL HIGH (ref 0.44–1.00)
GFR calc Af Amer: 41 mL/min — ABNORMAL LOW (ref >60)
GFR calc non Af Amer: 35 mL/min — ABNORMAL LOW (ref >60)
Glucose, Bld: 105 mg/dL — ABNORMAL HIGH (ref 70–99)
Potassium: 3.8 mmol/L (ref 3.5–5.1)
Sodium: 139 mmol/L (ref 135–145)
Total Bilirubin: 0.5 mg/dL (ref 0.3–1.2)
Total Protein: 7 g/dL (ref 6.5–8.1)

## 2018-10-29 LAB — I-STAT CHEM 8, ED
BUN: 18 mg/dL (ref 8–23)
Calcium, Ion: 1.29 mmol/L (ref 1.15–1.40)
Chloride: 109 mmol/L (ref 98–111)
Creatinine, Ser: 1.2 mg/dL — ABNORMAL HIGH (ref 0.44–1.00)
Glucose, Bld: 103 mg/dL — ABNORMAL HIGH (ref 70–99)
HCT: 41 % (ref 36.0–46.0)
Hemoglobin: 13.9 g/dL (ref 12.0–15.0)
Potassium: 3.8 mmol/L (ref 3.5–5.1)
Sodium: 141 mmol/L (ref 135–145)
TCO2: 23 mmol/L (ref 22–32)

## 2018-10-29 LAB — DIFFERENTIAL
Abs Immature Granulocytes: 0.02 10*3/uL (ref 0.00–0.07)
Basophils Absolute: 0.1 10*3/uL (ref 0.0–0.1)
Basophils Relative: 1 %
Eosinophils Absolute: 0.3 10*3/uL (ref 0.0–0.5)
Eosinophils Relative: 7 %
Immature Granulocytes: 0 %
Lymphocytes Relative: 30 %
Lymphs Abs: 1.4 10*3/uL (ref 0.7–4.0)
Monocytes Absolute: 0.3 10*3/uL (ref 0.1–1.0)
Monocytes Relative: 6 %
Neutro Abs: 2.5 10*3/uL (ref 1.7–7.7)
Neutrophils Relative %: 56 %

## 2018-10-29 LAB — CBC
HCT: 42.8 % (ref 36.0–46.0)
Hemoglobin: 13.2 g/dL (ref 12.0–15.0)
MCH: 26.1 pg (ref 26.0–34.0)
MCHC: 30.8 g/dL (ref 30.0–36.0)
MCV: 84.8 fL (ref 80.0–100.0)
Platelets: 268 10*3/uL (ref 150–400)
RBC: 5.05 MIL/uL (ref 3.87–5.11)
RDW: 15.4 % (ref 11.5–15.5)
WBC: 4.5 10*3/uL (ref 4.0–10.5)
nRBC: 0 % (ref 0.0–0.2)

## 2018-10-29 LAB — APTT: aPTT: 33 seconds (ref 24–36)

## 2018-10-29 LAB — PROTIME-INR
INR: 1 (ref 0.8–1.2)
Prothrombin Time: 12.6 seconds (ref 11.4–15.2)

## 2018-10-29 LAB — ETHANOL: Alcohol, Ethyl (B): <10 mg/dL (ref <10)

## 2018-10-29 MED ORDER — HYDRALAZINE HCL 20 MG/ML IJ SOLN: 5.0000 mg | INTRAMUSCULAR | Status: DC | PRN

## 2018-10-29 MED ORDER — SODIUM CHLORIDE 0.9 % IV SOLN
INTRAVENOUS | Status: DC
  Administered 2018-10-30: via INTRAVENOUS

## 2018-10-29 MED ORDER — OLOPATADINE HCL 0.1 % OP SOLN
1.0000 [drp] | Freq: Two times a day (BID) | OPHTHALMIC | Status: DC | PRN
  Filled 2018-10-29: qty 5

## 2018-10-29 MED ORDER — ASPIRIN 325 MG PO TABS
325.0000 mg | ORAL_TABLET | Freq: Every day | ORAL | Status: DC
  Administered 2018-10-30: 325 mg via ORAL
  Filled 2018-10-29: qty 1

## 2018-10-29 MED ORDER — ACETAMINOPHEN 650 MG RE SUPP: 650.0000 mg | RECTAL | Status: DC | PRN

## 2018-10-29 MED ORDER — ATORVASTATIN CALCIUM 40 MG PO TABS
40.0000 mg | ORAL_TABLET | Freq: Every day | ORAL | Status: DC
  Administered 2018-10-30: 40 mg via ORAL
  Filled 2018-10-29: qty 1

## 2018-10-29 MED ORDER — ACETAMINOPHEN 325 MG PO TABS
650.0000 mg | ORAL_TABLET | ORAL | Status: DC | PRN
  Administered 2018-10-30: 650 mg via ORAL
  Filled 2018-10-29: qty 2

## 2018-10-29 MED ORDER — SENNOSIDES-DOCUSATE SODIUM 8.6-50 MG PO TABS: 1.0000 | ORAL_TABLET | Freq: Every evening | ORAL | Status: DC | PRN

## 2018-10-29 MED ORDER — ENOXAPARIN SODIUM 30 MG/0.3ML ~~LOC~~ SOLN
30.0000 mg | SUBCUTANEOUS | Status: DC
  Administered 2018-10-30: 30 mg via SUBCUTANEOUS
  Filled 2018-10-29: qty 0.3

## 2018-10-29 MED ORDER — ONDANSETRON HCL 4 MG/2ML IJ SOLN: 4.0000 mg | Freq: Three times a day (TID) | INTRAMUSCULAR | Status: DC | PRN

## 2018-10-29 MED ORDER — ASPIRIN 300 MG RE SUPP: 300.0000 mg | Freq: Every day | RECTAL | Status: DC

## 2018-10-29 MED ORDER — STROKE: EARLY STAGES OF RECOVERY BOOK
Freq: Once | Status: AC
  Administered 2018-10-30
  Filled 2018-10-29: qty 1

## 2018-10-29 MED ORDER — ACETAMINOPHEN 160 MG/5ML PO SOLN: 650.0000 mg | ORAL | Status: DC | PRN

## 2018-10-29 NOTE — ED Provider Notes (Signed)
Medical screening examination/treatment/procedure(s) were conducted as a shared visit with non-physician practitioner(s) and myself.  I personally evaluated the patient during the encounter.    Patient with slight facial droop and slurred speech that was new.  Reviewed with patient's daughter.  She noted that her mother speech was significantly changed.  She had last been seen at 7 PM with normal baseline speech.  Patient not noting any complaints at this time.  Patient is alert and interactive.  She has a slightly perceptible droop at the left corner of the mouth.  She is answering all questions appropriately with speech vaguely slurred.  Cognitive function intact.  She follows all commands appropriately.  Motor strength testing intact with possible slight decrease in right upper extremity strength but intact use.  I agree with plan of management.   Charlesetta Shanks, MD 11/05/18 [phone]

## 2018-10-29 NOTE — ED Triage Notes (Signed)
Pt bib family after finding pt today with face "drawn up", slurred speech and drooling. Pt did not notice and has no complaints. Recently stopped taking her plavix.

## 2018-10-29 NOTE — H&P (Addendum)
History and Physical    APRIL Gomez HMC:[id number] DOB: [birth year] DOA: 10/29/2018  Referring MD/NP/PA:   PCP: [name], DO   Patient coming from:  The patient is coming from home.  At baseline, pt is dependent for most of ADL.        Chief Complaint: Left facial droop, slurred speech  HPI: Toni Gomez is a 85 y.o. female with medical history significant of hypertension, hyperlipidemia, stroke, CKD stage III, who presents with left facial droop, slurred speech.  Patient's caregiver, patient was last known normal at about 5 PM yesterday, and was noted to have slurred speech and left facial droop at about 2 PM today.  Patient has chronic bilateral leg weakness which has not changed.  No vision change or hearing loss.  She is using walker.  No unilateral numbness or tingling to extremities.  No weakness in arms.  Patient denies chest pain, shortness breath, cough, fever or chills.  No nausea vomiting, diarrhea, abdominal pain, symptoms of UTI or unilateral weakness. Of note, pt was on plavix for hx of stroke, but stopped taking it due to GI upset.  ED Course: pt was found to have WBC 4.5, negative urinalysis, pending COVID-19 test, stable renal function, blood pressure two 7/77, 199/79, no tachycardia, oxygen saturation 100% on room air, temperature normal.  MRI of brain showed acute stroke.  Patient is placed on telemetry bed for observation.  Dr. Cheral Marker of neurology was consulted.  Review of Systems:   General: no fevers, chills, no body weight gain, has fatigue HEENT: no blurry vision, hearing changes or sore throat Respiratory: no dyspnea, coughing, wheezing CV: no chest pain, no palpitations GI: no nausea, vomiting, abdominal pain, diarrhea, constipation GU: no dysuria, burning on urination, increased urinary frequency, hematuria  Ext: no leg edema Neuro: no unilateral numbness, or tingling, no vision change or hearing loss. Has slurred speech and left facial droop  Skin: no rash, no skin tear. MSK: No muscle spasm, no deformity, no limitation of range of movement in spin Heme: No easy bruising.  Travel history: No recent long distant travel.  Allergy: No Known Allergies  Past Medical History:  Diagnosis Date  . Elevated troponin 03/26/2018  . Hyperlipidemia   . Hypertension   . Renal disorder    chronic kidney disease  . Rhabdomyolysis   . Stroke [facility])     Past Surgical History:  Procedure Laterality Date  . BREAST SURGERY  1980   R breast removed, not cancer    Social History:  reports that she has never smoked. She has never used smokeless tobacco. She reports that she does not drink alcohol or use drugs.  Family History:  Family History  Problem Relation Age of Onset  . Hypertension Other   . Asthma Father      Prior to Admission medications   Medication Sig Start Date End Date Taking? Authorizing Provider  amLODipine (NORVASC) 5 MG tablet Take 1 tablet (5 mg total) by mouth daily. 10/25/18   [name], DO  meloxicam (MOBIC) 7.5 MG tablet Take 7.5 mg by mouth daily.    [provider]  olopatadine (PATANOL) 0.1 % ophthalmic solution Place 1 drop into both eyes 2 (two) times daily. (for eye allergies) 07/25/18   [name], DO    Physical Exam: Vitals:   10/29/18 1902 10/29/18 1930 10/29/18 2000 10/29/18 2209  BP: (!) 207/77 (!) 196/72 (!) 199/79 (!) 165/113  Pulse: 75 73 70 91  Resp: 16 20 19 14   Temp:      TempSrc:      SpO2: 100% 100% 100% 98%   General: Not in acute distress HEENT:       Eyes: PERRL, EOMI, no scleral icterus.       ENT: No discharge from the ears and nose, no pharynx injection, no tonsillar enlargement.        Neck: No JVD, no bruit, no mass felt. Heme: No neck lymph node enlargement. Cardiac: S1/S2, RRR, No murmurs, No gallops or rubs. Respiratory:  No rales, wheezing, rhonchi or rubs. GI: Soft, nondistended, nontender, no rebound pain, no organomegaly, BS present. GU: No  hematuria Ext: No pitting leg edema bilaterally. 2+DP/PT pulse bilaterally. Musculoskeletal: No joint deformities, No joint redness or warmth, no limitation of ROM in spin. Skin: No rashes.  Neuro: Alert, oriented X3, cranial nerves II-XII grossly intact except for left facial droop. Muscle strength 4/5 in both legs and 5/5 in both arms. sensation to light touch intact. Brachial reflex 2+ bilaterally. Negative Babinski's sign. Normal finger to nose test. Psych: Patient is not psychotic, no suicidal or hemocidal ideation.  Labs on Admission: I have personally reviewed following labs and imaging studies  CBC: Recent Labs  Lab 10/29/18 1905 10/29/18 1912  WBC 4.5  --   NEUTROABS 2.5  --   HGB 13.2 13.9  HCT 42.8 41.0  MCV 84.8  --   PLT 268  --    Basic Metabolic Panel: Recent Labs  Lab 10/29/18 1905 10/29/18 1912  NA 139 141  K 3.8 3.8  CL 108 109  CO2 23  --   GLUCOSE 105* 103*  BUN 18 18  CREATININE 1.30* 1.20*  CALCIUM 10.5*  --    GFR: CrCl cannot be calculated (Unknown ideal weight.). Liver Function Tests: Recent Labs  Lab 10/29/18 1905  AST 19  ALT 11  ALKPHOS 64  BILITOT 0.5  PROT 7.0  ALBUMIN 3.9   No results for input(s): LIPASE, AMYLASE in the last 168 hours. No results for input(s): AMMONIA in the last 168 hours. Coagulation Profile: Recent Labs  Lab 10/29/18 1905  INR 1.0   Cardiac Enzymes: No results for input(s): CKTOTAL, CKMB, CKMBINDEX, TROPONINI in the last 168 hours. BNP (last 3 results) No results for input(s): PROBNP in the last 8760 hours. HbA1C: No results for input(s): HGBA1C in the last 72 hours. CBG: No results for input(s): GLUCAP in the last 168 hours. Lipid Profile: No results for input(s): CHOL, HDL, LDLCALC, TRIG, CHOLHDL, LDLDIRECT in the last 72 hours. Thyroid Function Tests: No results for input(s): TSH, T4TOTAL, FREET4, T3FREE, THYROIDAB in the last 72 hours. Anemia Panel: No results for input(s): VITAMINB12, FOLATE,  FERRITIN, TIBC, IRON, RETICCTPCT in the last 72 hours. Urine analysis:    Component Value Date/Time   COLORURINE STRAW (A) 10/29/2018 2024   APPEARANCEUR CLEAR 10/29/2018 2024   LABSPEC 1.006 10/29/2018 2024   PHURINE 6.0 10/29/2018 2024   GLUCOSEU NEGATIVE 10/29/2018 2024   HGBUR NEGATIVE 10/29/2018 2024   [place] NEGATIVE 10/29/2018 2024   KETONESUR NEGATIVE 10/29/2018 2024   PROTEINUR 30 (A) 10/29/2018 2024   NITRITE NEGATIVE 10/29/2018 2024   LEUKOCYTESUR NEGATIVE 10/29/2018 2024   Sepsis Labs: @LABRCNTIP (procalcitonin:4,lacticidven:4) )No results found for this or any previous visit (from the past 240 hour(s)).   Radiological Exams on Admission: Mr Brain Wo Contrast  Result Date: 10/29/2018 CLINICAL DATA:  Focal neuro deficit for greater than 6 hours, stroke suspected. Patient's family  noted slurred speech and asymmetric face today. EXAM: MRI HEAD WITHOUT CONTRAST TECHNIQUE: Multiplanar, multiecho pulse sequences of the brain and surrounding structures were obtained without intravenous contrast. COMPARISON:  MRI brain 03/28/2018 FINDINGS: Brain: Focal areas of cortical restricted diffusion noted in the anterior right insular cortex and along the right precentral gyrus. There is a punctate focus of restricted diffusion in the subcortical white matter of the left frontal lobe. T2 signal changes are associated with the areas of acute restricted diffusion. Additional bilateral T2 hyperintensities are present within the corona radiata. There are is a remote lacunar infarct in the left thalamus. No other focal cortical infarcts are present. The brainstem and cerebellum are normal. Insert normal IAC moderate generalized atrophy is present. The ventricles are proportionate to the degree of atrophy. No significant extraaxial fluid collection is present. Vascular: Flow is present in the major intracranial arteries. Skull and upper cervical spine: Multilevel degenerative changes are present in  the cervical spine with chronic loss of disc height C2-3 through C5-6. Endplate changes are noted. The craniocervical junction is normal. Midline structures are otherwise unremarkable. Sinuses/Orbits: The paranasal sinuses and mastoid air cells are clear. Bilateral lens replacements are noted. Globes and orbits are otherwise unremarkable. IMPRESSION: 1. Acute nonhemorrhagic cortical infarcts involving the right precentral gyrus compatible with left-sided facial or extremity symptoms. 2. Acute nonhemorrhagic cortical infarct involving the anterior right insular cortex. 3. Acute punctate nonhemorrhagic subcortical white matter infarct in the left frontal lobe. 4. Moderate atrophy and periventricular white matter changes otherwise likely reflect the sequela of chronic microvascular ischemia. Electronically Signed   By: San Morelle M.D.   On: 10/29/2018 21:22     EKG: Independently reviewed.  Sinus rhythm, QTC 493, bifascicular block, first-degree AV block  Assessment/Plan Principal Problem:   Stroke [facility]) Active Problems:   Hypertension   CKD (chronic kidney disease) stage 3, GFR 30-59 ml/min (HCC)   Hyperlipidemia   Hypercalcemia   Stroke [facility]): This is recurrent stroke. pt was on plavix for hx of stroke, but stopped taking it due to GI upset. Dr. Mendel Ryder of her neurology was consulted.  - will place on tele bed for obs - will follow up Neurology's Recs.  - Obtain MRA  - will hold oral Bp meds to allow permissive HTN in the setting of acute stroke  - Check carotid dopplers  - start ASA and lipitor - fasting lipid panel and HbA1c  - 2D transthoracic echocardiography  - swallowing screen. If fails, will get SLP - Check UDS  - PT/OT consult  Hypertension: -- will hold oral Bp meds to allow permissive HTN in the setting of acute stroke  -prn IV hydralazine for SBP> 180 per Dr. Cheral Marker  Hyperlipidemia: pt is not taking meds at home -started lipitor 40 mg daily -f/u FLP   Hypercalcemia: mild, Ca 10.5.  Possibly due to dehydration.  Will treat with IV fluid.  If no improvement, will then do further work-up. -IV fluid normal saline at 75 cc/h  CKD-III: stable.  Baseline creatinine 1.3-1.6.  Her creatinine is 1.20, BUN 18. -f/u by BMP   DVT ppx: SQ Lovenox Code Status: DNR per POA  Family Communication: Yes, patient's care giver who is POA is at bed side Disposition Plan:  Anticipate discharge back to previous home environment Consults called:  Dr. Cheral Marker of neurology Admission status: Obs / tele    Date of Service 10/29/2018    [facility]   If 7PM-7AM, please contact night-coverage [email or web address] Password  [email or web address] 10/29/2018, 10:48 PM

## 2018-10-29 NOTE — ED Notes (Signed)
Toni Gomez, niece and POA, wishes to be contacted with any updates [phone]

## 2018-10-29 NOTE — ED Provider Notes (Signed)
[place] EMERGENCY DEPARTMENT Provider Note   CSN: [id number] Arrival date & time: 10/29/18  [facility]    History   Chief Complaint Chief Complaint  Patient presents with   Stroke Symptoms    HPI Toni Gomez is a 85 y.o. female.     Toni Gomez is a 85 y.o. female with a history of hypertension, hyperlipidemia, CKD, stroke and rhabdomyolysis, who presents to the emergency department for evaluation of facial droop and slurred speech.  Last known well time 7 PM yesterday.  Just prior to arrival patient's neighbor [name] went over to check on her and noticed that she had left-sided droop of her mouth, appeared to be drooling on herself and had slurred speech, when [name] patient's neighbor checked on her yesterday and when her daughter saw her yesterday evening at 7 PM patient was in her usual state of health and without any symptoms.  Patient is unsure when they started, reports she was not aware of them until neighbor noticed this evening.  Patient denies any associated vision changes, headache or dizziness.  She has not noted any numbness weakness or tingling in her arms or legs and has been up and mobile with her walker as per usual.  She denies any chest pain, shortness of breath, abdominal pain, nausea or vomiting.  Has not had any fevers cough or recent illness.  Gomez recent falls or head injury.  Additional history obtained from Toni Gomez, patient's neighbor and power of attorney who is at bedside with patient.  Chart review reveals that patient recently discontinued Plavix due to it causing generalized malaise, reports after stopping medication she has been feeling better, patient does take daily 81 mg aspirin.     Past Medical History:  Diagnosis Date   Elevated troponin 03/26/2018   Hyperlipidemia    Hypertension    Renal disorder    chronic kidney disease   Rhabdomyolysis    Stroke [facility])     Patient Active Problem List   Diagnosis Date Noted    Allergic conjunctivitis of both eyes 07/25/2018   Hyperlipidemia 03/31/2018   History of stroke 03/28/2018   CKD (chronic kidney disease) stage 3, GFR 30-59 ml/min (HCC)    Fall    Stroke (cerebrum) [facility])    Abnormal EKG 03/27/2018   Syncope and collapse 03/27/2018   Acute encephalopathy 03/27/2018   Altered mental status 03/26/2018   Rhabdomyolysis 03/26/2018   Hypertension 03/26/2018    Past Surgical History:  Procedure Laterality Date   [place]   R breast removed, not cancer     OB History   Gomez obstetric history on file.      Home Medications    Prior to Admission medications   Medication Sig Start Date End Date Taking? Authorizing Provider  amLODipine (NORVASC) 5 MG tablet Take 1 tablet (5 mg total) by mouth daily. 10/25/18   [name], DO  meloxicam (MOBIC) 7.5 MG tablet Take 7.5 mg by mouth daily.    [provider]  olopatadine (PATANOL) 0.1 % ophthalmic solution Place 1 drop into both eyes 2 (two) times daily. (for eye allergies) 07/25/18   [name], DO    Family History Family History  Problem Relation Age of Onset   Hypertension Other    Asthma Father     Social History Social History   Tobacco Use   Smoking status: Never Smoker   Smokeless tobacco: Never Used  Substance Use Topics  Alcohol use: Gomez   Drug use: Gomez     Allergies   Patient has Gomez known allergies.   Review of Systems Review of Systems  Constitutional: Negative for chills and fever.  HENT: Negative.   Eyes: Negative for visual disturbance.  Respiratory: Negative for cough and shortness of breath.   Cardiovascular: Negative for chest pain.  Gastrointestinal: Negative for abdominal distention, nausea and vomiting.  Genitourinary: Negative for dysuria and frequency.  Musculoskeletal: Negative for arthralgias, back pain and neck pain.  Skin: Negative for color change and rash.  Neurological: Positive for facial asymmetry  and speech difficulty. Negative for dizziness, tremors, syncope, weakness, light-headedness, numbness and headaches.     Physical Exam Updated Vital Signs BP (!) 196/72    Pulse 73    Temp 98.1 F (36.7 C) (Oral)    Resp 20    SpO2 100%   Physical Exam Vitals signs and nursing note reviewed.  Constitutional:      General: She is not in acute distress.    Appearance: She is well-developed and normal weight. She is not ill-appearing or diaphoretic.  HENT:     Head: Normocephalic and atraumatic.     Nose: Nose normal.     Mouth/Throat:     Mouth: Mucous membranes are moist.     Pharynx: Oropharynx is clear.  Eyes:     General:        Right eye: Gomez discharge.        Left eye: Gomez discharge.     Extraocular Movements: Extraocular movements intact.     Conjunctiva/sclera: Conjunctivae normal.     Pupils: Pupils are equal, round, and reactive to light.     Comments: PERRLA, EOMI  Neck:     Musculoskeletal: Neck supple.  Cardiovascular:     Rate and Rhythm: Normal rate and regular rhythm.     Pulses: Normal pulses.          Radial pulses are 2+ on the right side and 2+ on the left side.       Dorsalis pedis pulses are 2+ on the right side and 2+ on the left side.     Heart sounds: Normal heart sounds. Gomez murmur. Gomez friction rub. Gomez gallop.   Pulmonary:     Effort: Pulmonary effort is normal. Gomez respiratory distress.     Breath sounds: Normal breath sounds. Gomez wheezing or rales.     Comments: Respirations equal and unlabored, patient able to speak in full sentences, lungs clear to auscultation bilaterally Abdominal:     General: Bowel sounds are normal. There is Gomez distension.     Palpations: Abdomen is soft. There is Gomez mass.     Tenderness: There is Gomez abdominal tenderness. There is Gomez guarding.     Comments: Abdomen soft, nondistended, nontender to palpation in all quadrants without guarding or peritoneal signs  Musculoskeletal:        General: Gomez deformity.     Right lower  leg: Gomez edema.     Left lower leg: Gomez edema.     Comments: Bilateral lower extremities warm and well-perfused without edema or tenderness.  Skin:    General: Skin is warm and dry.     Capillary Refill: Capillary refill takes less than 2 seconds.  Neurological:     Mental Status: She is alert.     Coordination: Coordination normal.     Comments: Speech is somewhat slurred but patient has Gomez evidence of expressive aphasia answers questions  appropriately and is able to follow commands. Slight drooping of the left corner of the mouth noted and patient drooling but face otherwise symmetrical, able to raise eyebrows bilaterally and close eyes tightly.  Sensation equal over the face bilaterally.  All other cranial nerves intact. 4+/5 strength in the right upper extremity, 5/5 strength in all other extremities Sensation intact and equal in all 4 extremities. Normal finger-to-nose testing, Gomez pronator drift.  Moves all extremities with normal coordination  Psychiatric:        Mood and Affect: Mood normal.        Behavior: Behavior normal.      ED Treatments / Results  Labs (all labs ordered are listed, but only abnormal results are displayed) Labs Reviewed  COMPREHENSIVE METABOLIC PANEL - Abnormal; Notable for the following components:      Result Value   Glucose, Bld 105 (*)    Creatinine, Ser 1.30 (*)    Calcium 10.5 (*)    GFR calc non Af Amer 35 (*)    GFR calc Af Amer 41 (*)    All other components within normal limits  URINALYSIS, ROUTINE W REFLEX MICROSCOPIC - Abnormal; Notable for the following components:   Color, Urine STRAW (*)    Protein, ur 30 (*)    All other components within normal limits  LIPID PANEL - Abnormal; Notable for the following components:   Cholesterol 222 (*)    LDL Cholesterol 156 (*)    All other components within normal limits  I-STAT CHEM 8, ED - Abnormal; Notable for the following components:   Creatinine, Ser 1.20 (*)    Glucose, Bld 103 (*)     All other components within normal limits  SARS CORONAVIRUS 2  ETHANOL  PROTIME-INR  APTT  CBC  DIFFERENTIAL  HEMOGLOBIN A1C    EKG EKG Interpretation  Date/Time:  Saturday October 29 2018 18:49:52 EDT Ventricular Rate:  77 PR Interval:    QRS Duration: 155 QT Interval:  435 QTC Calculation: 493 R Axis:     Text Interpretation:  Sinus rhythm Prolonged PR interval Right bundle branch block Confirmed by Pryor Curia [phone]) on 10/30/2018 12:24:09 PM   Radiology Mr Brain Wo Contrast  Result Date: 10/29/2018 CLINICAL DATA:  Focal neuro deficit for greater than 6 hours, stroke suspected. Patient's family noted slurred speech and asymmetric face today. EXAM: MRI HEAD WITHOUT CONTRAST TECHNIQUE: Multiplanar, multiecho pulse sequences of the brain and surrounding structures were obtained without intravenous contrast. COMPARISON:  MRI brain 03/28/2018 FINDINGS: Brain: Focal areas of cortical restricted diffusion noted in the anterior right insular cortex and along the right precentral gyrus. There is a punctate focus of restricted diffusion in the subcortical white matter of the left frontal lobe. T2 signal changes are associated with the areas of acute restricted diffusion. Additional bilateral T2 hyperintensities are present within the corona radiata. There are is a remote lacunar infarct in the left thalamus. Gomez other focal cortical infarcts are present. The brainstem and cerebellum are normal. Insert normal IAC moderate generalized atrophy is present. The ventricles are proportionate to the degree of atrophy. Gomez significant extraaxial fluid collection is present. Vascular: Flow is present in the major intracranial arteries. Skull and upper cervical spine: Multilevel degenerative changes are present in the cervical spine with chronic loss of disc height C2-3 through C5-6. Endplate changes are noted. The craniocervical junction is normal. Midline structures are otherwise unremarkable. Sinuses/Orbits:  The paranasal sinuses and mastoid air cells are clear. Bilateral lens replacements  are noted. Globes and orbits are otherwise unremarkable. IMPRESSION: 1. Acute nonhemorrhagic cortical infarcts involving the right precentral gyrus compatible with left-sided facial or extremity symptoms. 2. Acute nonhemorrhagic cortical infarct involving the anterior right insular cortex. 3. Acute punctate nonhemorrhagic subcortical white matter infarct in the left frontal lobe. 4. Moderate atrophy and periventricular white matter changes otherwise likely reflect the sequela of chronic microvascular ischemia. Electronically Signed   By: San Morelle M.D.   On: 10/29/2018 21:22    Procedures Procedures (including critical care time)  Medications Ordered in ED Medications   stroke: mapping our early stages of recovery book ( Does not apply Given 10/30/18 0002)     Initial Impression / Assessment and Plan / ED Course  I have reviewed the triage vital signs and the nursing notes.  Pertinent labs & imaging results that were available during my care of the patient were reviewed by me and considered in my medical decision making (see chart for details).  Patient presents with left-sided facial droop over the mouth, and slurred speech concerning for stroke, last known well time 7 PM yesterday, outside of window for acute stroke intervention, 4+/5 strength in the left upper extremity, but Gomez other deficits noted on exam, sensation intact throughout.  Will proceed with stroke work-up, I have Gomez concern for acute hemorrhagic stroke, out therefore will proceed directly to MRI of the brain as well as lab evaluation.  Patient recently discontinued Plavix due to generalized malaise, is still taking 81 mg aspirin.  Updated patient's niece, patient's neighbor [name] is at bedside.  Labs overall reassuring, Gomez leukocytosis, normal hemoglobin, creatinine 1.3 today which appears to be at baseline, Gomez other significant  electrolyte derangements, normal renal function, normal coags.  Urinalysis without signs of infection.  Given anticipated admission, coronavirus test ordered which was negative.  MRI significant for 3 small acute nonhemorrhagic infarct in the right precentral gyrus, right insular cortex, and left frontal lobe.  Will consult neurology.  Case discussed with Dr. Cheral Marker with neurology who will see the patient in consult, patient will likely need to increase aspirin dosing or go back on Plavix given multiple small strokes after stopping Plavix.  Recommends medicine admission for further stroke work-up.  Case discussed with Dr. Blaine Hamper with [facility] who will see and admit the patient.  Final Clinical Impressions(s) / ED Diagnoses   Final diagnoses:  Cerebral infarction, unspecified mechanism [facility])    ED Discharge Orders    None       Jacqlyn Larsen, [place] 11/01/18 1521    Charlesetta Shanks, MD 11/05/18 [phone]

## 2018-10-29 NOTE — Consult Note (Signed)
Referring Physician: Dr. Johnney Killian    Chief Complaint: Acute strokes on MRI  HPI: Toni Gomez is an 85 y.o. female who presented to the ED with her niece today after she was noted to have left facial droop. The patient herself was aysmptomatic. She takes a baby ASA qd and used to take Plavix, but stopped the latter due to side effect of malaise. The patient denies headache or any other neurological complaints, including no weakness or sensory numbness. She is at times confused, but her speech is at baseline per her niece. Also with no cough, SOB, CP or anosmia.   Her stroke risk factors include HTN, HLD and prior stroke.   MRI brain: 1. Acute nonhemorrhagic cortical infarcts involving the right precentral gyrus compatible with left-sided facial or extremity symptoms. 2. Acute nonhemorrhagic cortical infarct involving the anterior right insular cortex. 3. Acute punctate nonhemorrhagic subcortical white matter infarct in the left frontal lobe. 4. Moderate atrophy and periventricular white matter changes otherwise likely reflect the sequela of chronic microvascular ischemia.   Past Medical History:  Diagnosis Date  . Elevated troponin 03/26/2018  . Hyperlipidemia   . Hypertension   . Renal disorder    chronic kidney disease  . Rhabdomyolysis   . Stroke [facility])     Past Surgical History:  Procedure Laterality Date  . BREAST SURGERY  1980   R breast removed, not cancer    Family History  Problem Relation Age of Onset  . Hypertension Other   . Asthma Father    Social History:  reports that she has never smoked. She has never used smokeless tobacco. She reports that she does not drink alcohol or use drugs.  Allergies: No Known Allergies  Home Medications:  Norvasc Mobic Olopatadine  ASA 81 mg qd  ROS: As per HPI. All other systems negative.   Physical Examination: Blood pressure (!) 199/79, pulse 70, temperature 98.1 F (36.7 C), temperature source Oral, resp. rate  19, SpO2 100 %.  HEENT: Iva/AT Lungs: Respirations unlabored Ext: Mild pitting edema distal BLE  Neurologic Examination: Mental Status: Awake and alert. Mild confusion noted. Speech fluent with intact comprehension and naming. . Cranial Nerves: II:  Visual fields intact. PERRL.  III,IV, VI: EOMI without nystagmus. No ptosis.  V,VII: No facial droop. Temp sensation equal bilaterally.  VIII: Hearing intact to voice IX,X: No hypophonia XI: Symmetric shoulder shrug XII: midline tongue extension  Motor: Right : Upper extremity   5/5    Left:     Upper extremity   5/5  Lower extremity   5/5     Lower extremity   5/5 Sensory: Temp and light touch intact x 4. No extinction.  Deep Tendon Reflexes:  2+ bilateral brachioradialis and biceps 0 patellae and achilles bilaterally.  Plantars: Equivocal.  Cerebellar: No ataxia with FNF bilaterally  Gait: Deferred  Results for orders placed or performed during the hospital encounter of 10/29/18 (from the past 48 hour(s))  Ethanol     Status: None   Collection Time: 10/29/18  7:05 PM  Result Value Ref Range   Alcohol, Ethyl (B) <10 <10 mg/dL    Comment: (NOTE) Lowest detectable limit for serum alcohol is 10 mg/dL. For medical purposes only. Performed at [facility], [place] [address], [place], [place] [address]   Protime-INR     Status: None   Collection Time: 10/29/18  7:05 PM  Result Value Ref Range   Prothrombin Time 12.6 11.4 - 15.2 seconds  INR 1.0 0.8 - 1.2    Comment: (NOTE) INR goal varies based on device and disease states. Performed at [facility], [place] [address] [place], [place] [address]   APTT     Status: None   Collection Time: 10/29/18  7:05 PM  Result Value Ref Range   aPTT 33 24 - 36 seconds    Comment: Performed at [facility] [address] [place] [address]  CBC     Status: None   Collection Time: 10/29/18  7:05 PM  Result Value Ref Range   WBC 4.5 4.0 - 10.5 K/uL   RBC 5.05 3.87  - 5.11 MIL/uL   Hemoglobin 13.2 12.0 - 15.0 g/dL   HCT 42.8 36.0 - 46.0 %   MCV 84.8 80.0 - 100.0 fL   MCH 26.1 26.0 - 34.0 pg   MCHC 30.8 30.0 - 36.0 g/dL   RDW 15.4 11.5 - 15.5 %   Platelets 268 150 - 400 K/uL   nRBC 0.0 0.0 - 0.2 %    Comment: Performed at [facility], [place] [address], [place], [place] [address]  Differential     Status: None   Collection Time: 10/29/18  7:05 PM  Result Value Ref Range   Neutrophils Relative % 56 %   Neutro Abs 2.5 1.7 - 7.7 K/uL   Lymphocytes Relative 30 %   Lymphs Abs 1.4 0.7 - 4.0 K/uL   Monocytes Relative 6 %   Monocytes Absolute 0.3 0.1 - 1.0 K/uL   Eosinophils Relative 7 %   Eosinophils Absolute 0.3 0.0 - 0.5 K/uL   Basophils Relative 1 %   Basophils Absolute 0.1 0.0 - 0.1 K/uL   Immature Granulocytes 0 %   Abs Immature Granulocytes 0.02 0.00 - 0.07 K/uL    Comment: Performed at [facility], [place] [address] [place], [place] [address]  Comprehensive metabolic panel     Status: Abnormal   Collection Time: 10/29/18  7:05 PM  Result Value Ref Range   Sodium 139 135 - 145 mmol/L   Potassium 3.8 3.5 - 5.1 mmol/L   Chloride 108 98 - 111 mmol/L   CO2 23 22 - 32 mmol/L   Glucose, Bld 105 (H) 70 - 99 mg/dL   BUN 18 8 - 23 mg/dL   Creatinine, Ser 1.30 (H) 0.44 - 1.00 mg/dL   Calcium 10.5 (H) 8.9 - 10.3 mg/dL   Total Protein 7.0 6.5 - 8.1 g/dL   Albumin 3.9 3.5 - 5.0 g/dL   AST 19 15 - 41 U/L   ALT 11 0 - 44 U/L   Alkaline Phosphatase 64 38 - 126 U/L   Total Bilirubin 0.5 0.3 - 1.2 mg/dL   GFR calc non Af Amer 35 (L) >60 mL/min   GFR calc Af Amer 41 (L) >60 mL/min   Anion gap 8 5 - 15    Comment: Performed at [facility] [address] [place], [place] [address]  I-stat chem 8, ED     Status: Abnormal   Collection Time: 10/29/18  7:12 PM  Result Value Ref Range   Sodium 141 135 - 145 mmol/L   Potassium 3.8 3.5 - 5.1 mmol/L   Chloride 109 98 - 111 mmol/L   BUN 18 8 - 23 mg/dL   Creatinine, Ser 1.20 (H) 0.44 -  1.00 mg/dL   Glucose, Bld 103 (H) 70 - 99 mg/dL   Calcium, Ion 1.29 1.15 -  1.40 mmol/L   TCO2 23 22 - 32 mmol/L   Hemoglobin 13.9 12.0 - 15.0 g/dL   HCT 41.0 36.0 - 46.0 %  Urinalysis, Routine w reflex microscopic     Status: Abnormal   Collection Time: 10/29/18  8:24 PM  Result Value Ref Range   Color, Urine STRAW (A) YELLOW   APPearance CLEAR CLEAR   Specific Gravity, Urine 1.006 1.005 - 1.030   pH 6.0 5.0 - 8.0   Glucose, UA NEGATIVE NEGATIVE mg/dL   Hgb urine dipstick NEGATIVE NEGATIVE   Bilirubin Urine NEGATIVE NEGATIVE   Ketones, ur NEGATIVE NEGATIVE mg/dL   Protein, ur 30 (A) NEGATIVE mg/dL   Nitrite NEGATIVE NEGATIVE   Leukocytes,Ua NEGATIVE NEGATIVE   WBC, UA 0-5 0 - 5 WBC/hpf   Bacteria, UA NONE SEEN NONE SEEN   Squamous Epithelial / LPF 0-5 0 - 5    Comment: Performed at [facility], [place] [address], [place], [place] [address]   Mr Brain 23 Contrast  Result Date: 10/29/2018 CLINICAL DATA:  Focal neuro deficit for greater than 6 hours, stroke suspected. Patient's family noted slurred speech and asymmetric face today. EXAM: MRI HEAD WITHOUT CONTRAST TECHNIQUE: Multiplanar, multiecho pulse sequences of the brain and surrounding structures were obtained without intravenous contrast. COMPARISON:  MRI brain 03/28/2018 FINDINGS: Brain: Focal areas of cortical restricted diffusion noted in the anterior right insular cortex and along the right precentral gyrus. There is a punctate focus of restricted diffusion in the subcortical white matter of the left frontal lobe. T2 signal changes are associated with the areas of acute restricted diffusion. Additional bilateral T2 hyperintensities are present within the corona radiata. There are is a remote lacunar infarct in the left thalamus. No other focal cortical infarcts are present. The brainstem and cerebellum are normal. Insert normal IAC moderate generalized atrophy is present. The ventricles are proportionate to the degree of  atrophy. No significant extraaxial fluid collection is present. Vascular: Flow is present in the major intracranial arteries. Skull and upper cervical spine: Multilevel degenerative changes are present in the cervical spine with chronic loss of disc height C2-3 through C5-6. Endplate changes are noted. The craniocervical junction is normal. Midline structures are otherwise unremarkable. Sinuses/Orbits: The paranasal sinuses and mastoid air cells are clear. Bilateral lens replacements are noted. Globes and orbits are otherwise unremarkable. IMPRESSION: 1. Acute nonhemorrhagic cortical infarcts involving the right precentral gyrus compatible with left-sided facial or extremity symptoms. 2. Acute nonhemorrhagic cortical infarct involving the anterior right insular cortex. 3. Acute punctate nonhemorrhagic subcortical white matter infarct in the left frontal lobe. 4. Moderate atrophy and periventricular white matter changes otherwise likely reflect the sequela of chronic microvascular ischemia. Electronically Signed   By: San Morelle M.D.   On: 10/29/2018 21:22    Assessment: 85 y.o. female presenting with 3-4 acute small strokes in the right MCA territory 1. Exam is nonfocal.  2. MRI brain: Acute nonhemorrhagic cortical infarcts involving the right precentral gyrus, anterior right insular cortex and subcortical white matter in the left frontal lobe. Also noted are moderate atrophy and periventricular white matter changes consistent with chronic microvascular ischemia. 3. Stroke Risk Factors - HTN, HLD and prior stroke.  4. Stated allergy to Plavix (generalized malaise)  Recommendations: 1. HgbA1c, fasting lipid panel 2. MRA  of the brain without contrast 3. PT consult, OT consult, Speech consult 4. TTE 5. Carotid dopplers 6. Prophylactic therapy- Increase ASA to 325 mg po qd 7. Risk factor modification 8. Telemetry  monitoring 9. Frequent neuro checks 10. Risks of statin use outweigh benefits  given her advanced age.  11. Modified permissive HTN protocol given advanced age: Treat if SBP > 180  @Electronically  signed: Dr. Kerney Elbe  10/29/2018, 9:37 PM

## 2018-10-29 NOTE — Progress Notes (Signed)
Pt admitted from ED with stroke diagnosis, alert and oriented but a bit forgetful, denies any pain at this time, settled in bed with call light within pt's reach, tele monitor placed and verified on pt, was however reassured and will continue to monitor, safety measures initiated accordingly. Toni Gomez, Toni Gomez

## 2018-10-29 NOTE — ED Notes (Signed)
Patient transported to MRI 

## 2018-10-30 ENCOUNTER — Observation Stay (HOSPITAL_COMMUNITY): Payer: Medicare Other

## 2018-10-30 DIAGNOSIS — I129 Hypertensive chronic kidney disease with stage 1 through stage 4 chronic kidney disease, or unspecified chronic kidney disease: Secondary | ICD-10-CM | POA: Diagnosis present

## 2018-10-30 DIAGNOSIS — Z79899 Other long term (current) drug therapy: Secondary | ICD-10-CM | POA: Diagnosis not present

## 2018-10-30 DIAGNOSIS — Z8249 Family history of ischemic heart disease and other diseases of the circulatory system: Secondary | ICD-10-CM | POA: Diagnosis not present

## 2018-10-30 DIAGNOSIS — I6601 Occlusion and stenosis of right middle cerebral artery: Secondary | ICD-10-CM | POA: Diagnosis not present

## 2018-10-30 DIAGNOSIS — Z8673 Personal history of transient ischemic attack (TIA), and cerebral infarction without residual deficits: Secondary | ICD-10-CM | POA: Diagnosis not present

## 2018-10-30 DIAGNOSIS — I361 Nonrheumatic tricuspid (valve) insufficiency: Secondary | ICD-10-CM | POA: Diagnosis not present

## 2018-10-30 DIAGNOSIS — Z825 Family history of asthma and other chronic lower respiratory diseases: Secondary | ICD-10-CM | POA: Diagnosis not present

## 2018-10-30 DIAGNOSIS — I639 Cerebral infarction, unspecified: Secondary | ICD-10-CM

## 2018-10-30 DIAGNOSIS — I63511 Cerebral infarction due to unspecified occlusion or stenosis of right middle cerebral artery: Secondary | ICD-10-CM | POA: Diagnosis not present

## 2018-10-30 DIAGNOSIS — Z888 Allergy status to other drugs, medicaments and biological substances status: Secondary | ICD-10-CM | POA: Diagnosis not present

## 2018-10-30 DIAGNOSIS — I6389 Other cerebral infarction: Secondary | ICD-10-CM | POA: Diagnosis present

## 2018-10-30 DIAGNOSIS — E7849 Other hyperlipidemia: Secondary | ICD-10-CM | POA: Diagnosis not present

## 2018-10-30 DIAGNOSIS — N183 Chronic kidney disease, stage 3 (moderate): Secondary | ICD-10-CM | POA: Diagnosis not present

## 2018-10-30 DIAGNOSIS — G458 Other transient cerebral ischemic attacks and related syndromes: Secondary | ICD-10-CM | POA: Diagnosis present

## 2018-10-30 DIAGNOSIS — I351 Nonrheumatic aortic (valve) insufficiency: Secondary | ICD-10-CM | POA: Diagnosis not present

## 2018-10-30 DIAGNOSIS — Z791 Long term (current) use of non-steroidal anti-inflammatories (NSAID): Secondary | ICD-10-CM | POA: Diagnosis not present

## 2018-10-30 DIAGNOSIS — Z66 Do not resuscitate: Secondary | ICD-10-CM | POA: Diagnosis present

## 2018-10-30 DIAGNOSIS — Z1159 Encounter for screening for other viral diseases: Secondary | ICD-10-CM | POA: Diagnosis not present

## 2018-10-30 DIAGNOSIS — R29810 Facial weakness: Secondary | ICD-10-CM | POA: Diagnosis present

## 2018-10-30 DIAGNOSIS — R4781 Slurred speech: Secondary | ICD-10-CM | POA: Diagnosis present

## 2018-10-30 DIAGNOSIS — E785 Hyperlipidemia, unspecified: Secondary | ICD-10-CM | POA: Diagnosis present

## 2018-10-30 LAB — LIPID PANEL
Cholesterol: 222 mg/dL — ABNORMAL HIGH (ref 0–200)
HDL: 50 mg/dL (ref >40)
LDL Cholesterol: 156 mg/dL — ABNORMAL HIGH (ref 0–99)
Total CHOL/HDL Ratio: 4.4 RATIO
Triglycerides: 80 mg/dL (ref <150)
VLDL: 16 mg/dL (ref 0–40)

## 2018-10-30 LAB — HEMOGLOBIN A1C
Hgb A1c MFr Bld: 5.4 % (ref 4.8–5.6)
Mean Plasma Glucose: 108.28 mg/dL

## 2018-10-30 LAB — ECHOCARDIOGRAM COMPLETE
Height: 61 in
Weight: 2278.67 oz

## 2018-10-30 LAB — SARS CORONAVIRUS 2: SARS Coronavirus 2: NOT DETECTED

## 2018-10-30 MED ORDER — ASPIRIN EC 325 MG PO TBEC: 325.0000 mg | DELAYED_RELEASE_TABLET | Freq: Every day | ORAL | 3 refills | Status: AC

## 2018-10-30 MED ORDER — ATORVASTATIN CALCIUM 40 MG PO TABS: 40.0000 mg | ORAL_TABLET | Freq: Every day | ORAL | 0 refills | Status: DC

## 2018-10-30 MED ORDER — HYDRALAZINE HCL 20 MG/ML IJ SOLN
5.0000 mg | INTRAMUSCULAR | Status: DC | PRN
  Administered 2018-10-30: 5 mg via INTRAVENOUS
  Filled 2018-10-30: qty 1

## 2018-10-30 NOTE — Evaluation (Signed)
Physical Therapy Evaluation Patient Details Name: Toni Gomez MRN: [id number] DOB: Dec 08, 1923 Today's Date: 10/30/2018   History of Present Illness  Patient is a 85 y/o female presenting to the ED on 10/29/2018 with primary complaints of Left facial droop, slurred speech. Past medical history significant of hypertension, hyperlipidemia, stroke, CKD stage III. MRI brain revealing Acute nonhemorrhagic cortical infarcts involving the right precentral gyrus compatible with left-sided facial or extremity symptoms.    Clinical Impression  Patient admitted with the above listed diagnosis. Patient reports Mod I with mobility with RW vs manual w/c prior to admission where she lives home alone and has family check on her daily. Patient today requiring Mod A to stand from bedside and arm chair with poor safety awareness and balance with mobility. Patient pulling up from RW, as well as attempting to sit prematurely requiring heavy verbal cueing for safety and to prevent fall. Ultimately patient would benefit from post acute rehab, however patient strongly declines. Recommended to have family stay with her 24 hours at discharge, wear LifeAlert all the time and refrain from driving currently - limited carryover. Will need max HH services at discharge. PT to follow acutely.      Follow Up Recommendations Home health PT;Supervision/Assistance - 24 hour(recommend SNF however patient strongly declines)    Equipment Recommendations  None recommended by PT    Recommendations for Other Services       Precautions / Restrictions Precautions Precautions: Fall Restrictions Weight Bearing Restrictions: No      Mobility  Bed Mobility Overal bed mobility: Needs Assistance Bed Mobility: Supine to Sit;Sit to Supine     Supine to sit: Min guard;Supervision Sit to supine: Min guard;Supervision   General bed mobility comments: increased time and effort  Transfers Overall transfer level: Needs  assistance Equipment used: Rolling walker (2 wheeled) Transfers: Sit to/from [facility] Sit to Stand: Mod assist Stand pivot transfers: Min assist;Min guard       General transfer comment: patient Mod A to stand from bedside with noted postior lean; poor safety awareness wtih transfers with pateint pulling from RW as well as attempting to sit prematurely  Ambulation/Gait Ambulation/Gait assistance: Min guard Gait Distance (Feet): 20 Feet Assistive device: Rolling walker (2 wheeled) Gait Pattern/deviations: Step-to pattern;Step-through pattern;Decreased stride length;Trunk flexed Gait velocity: decreased   General Gait Details: poor safety awareness with lines; requires cueing to turn completely at sink and BSC prior to sitting to prevent fall  Stairs            Wheelchair Mobility    Modified Rankin (Stroke Patients Only) Modified Rankin (Stroke Patients Only) Pre-Morbid Rankin Score: No significant disability Modified Rankin: Moderately severe disability     Balance Overall balance assessment: Needs assistance Sitting-balance support: No upper extremity supported;Feet supported Sitting balance-Leahy Scale: Good     Standing balance support: Bilateral upper extremity supported;During functional activity Standing balance-Leahy Scale: Poor Standing balance comment: reliant on UE support                             Pertinent Vitals/Pain Pain Assessment: No/denies pain    Home Living Family/patient expects to be discharged to:: Private residence Living Arrangements: Alone Available Help at Discharge: Family;Available PRN/intermittently Type of Home: House Home Access: Level entry     Home Layout: One level Home Equipment: Walker - 2 wheels;Cane - single point;Shower seat;Bedside commode;Wheelchair - manual      Prior Function Level of Independence:  Independent with assistive device(s)         Comments: use of RW for community  mobility - states she uses w/c around house     Hand Dominance   Dominant Hand: Right    Extremity/Trunk Assessment   Upper Extremity Assessment Upper Extremity Assessment: Defer to OT evaluation    Lower Extremity Assessment Lower Extremity Assessment: Generalized weakness    Cervical / Trunk Assessment Cervical / Trunk Assessment: Kyphotic  Communication   Communication: HOH  Cognition Arousal/Alertness: Awake/alert Behavior During Therapy: WFL for tasks assessed/performed Overall Cognitive Status: Within Functional Limits for tasks assessed                                        General Comments      Exercises     Assessment/Plan    PT Assessment Patient needs continued PT services  PT Problem List Decreased strength;Decreased activity tolerance;Decreased balance;Decreased mobility;Decreased knowledge of use of DME;Decreased safety awareness       PT Treatment Interventions DME instruction;Gait training;Functional mobility training;Therapeutic activities;Therapeutic exercise;Balance training;Neuromuscular re-education;Patient/family education    PT Goals (Current goals can be found in the Care Plan section)  Acute Rehab PT Goals Patient Stated Goal: return home today PT Goal Formulation: With patient Time For Goal Achievement: 11/13/18 Potential to Achieve Goals: Good    Frequency Min 4X/week   Barriers to discharge Decreased caregiver support lives alone - doesn't want anyone to stay with her    Co-evaluation               AM-PAC PT "6 Clicks" Mobility  Outcome Measure Help needed turning from your back to your side while in a flat bed without using bedrails?: None Help needed moving from lying on your back to sitting on the side of a flat bed without using bedrails?: A Little Help needed moving to and from a bed to a chair (including a wheelchair)?: A Lot Help needed standing up from a chair using your arms (e.g., wheelchair or  bedside chair)?: A Lot Help needed to walk in hospital room?: A Little Help needed climbing 3-5 steps with a railing? : Total 6 Click Score: 15    End of Session Equipment Utilized During Treatment: Gait belt Activity Tolerance: Patient tolerated treatment well Patient left: in bed;with call bell/phone within reach Nurse Communication: Mobility status PT Visit Diagnosis: Unsteadiness on feet (R26.81);Other abnormalities of gait and mobility (R26.89);Muscle weakness (generalized) (M62.81)    Time: 6606-0045 PT Time Calculation (min) (ACUTE ONLY): 32 min   Charges:   PT Evaluation $PT Eval Moderate Complexity: 1 Mod          Lanney Gins, PT, DPT Supplemental Physical Therapist 10/30/18 3:02 PM Pager: [phone] Office: [phone]

## 2018-10-30 NOTE — Progress Notes (Signed)
STROKE TEAM PROGRESS NOTE   SUBJECTIVE (INTERVAL HISTORY) No family is at the bedside.  Pt is lying in bed, denies any distress. She still has left facial droop with mild dysarthria, no significant motor deficit. She did have left UE proximal mild weakness but she said it is chronic due to old shoulder arthritis.   OBJECTIVE Vitals:   10/30/18 0200 10/30/18 0400 10/30/18 0600 10/30/18 0806  BP: (!) 171/52 (!) 171/54 (!) 166/61 (!) 166/61  Pulse: 66 (!) 57 62 (!) 56  Resp: 18 18 18 16   Temp: (!) 97.3 F (36.3 C) (!) 97.5 F (36.4 C) 97.7 F (36.5 C) 97.7 F (36.5 C)  TempSrc: Oral Oral Oral Oral  SpO2: 100% 99% 100% 100%  Weight:      Height:        CBC:  Recent Labs  Lab 10/29/18 1905 10/29/18 1912  WBC 4.5  --   NEUTROABS 2.5  --   HGB 13.2 13.9  HCT 42.8 41.0  MCV 84.8  --   PLT 268  --     Basic Metabolic Panel:  Recent Labs  Lab 10/29/18 1905 10/29/18 1912  NA 139 141  K 3.8 3.8  CL 108 109  CO2 23  --   GLUCOSE 105* 103*  BUN 18 18  CREATININE 1.30* 1.20*  CALCIUM 10.5*  --     Lipid Panel:     Component Value Date/Time   CHOL 222 (H) 10/30/2018 0433   TRIG 80 10/30/2018 0433   HDL 50 10/30/2018 0433   CHOLHDL 4.4 10/30/2018 0433   VLDL 16 10/30/2018 0433   LDLCALC 156 (H) 10/30/2018 0433   HgbA1c:  Lab Results  Component Value Date   HGBA1C 5.4 10/30/2018   Urine Drug Screen: No results found for: LABOPIA, COCAINSCRNUR, LABBENZ, AMPHETMU, THCU, LABBARB  Alcohol Level     Component Value Date/Time   [facility] <10 10/29/2018 1905    IMAGING   Mr Brain Wo Contrast 10/29/2018 IMPRESSION:  1. Acute nonhemorrhagic cortical infarcts involving the right precentral gyrus compatible with left-sided facial or extremity symptoms.  2. Acute nonhemorrhagic cortical infarct involving the anterior right insular cortex.  3. Acute punctate nonhemorrhagic subcortical white matter infarct in the left frontal lobe.  4. Moderate atrophy and periventricular  white matter changes otherwise likely reflect the sequela of chronic microvascular ischemia.    Mr Jodene Nam Head Wo Contrast 10/30/2018 IMPRESSION:  1. Negative intracranial MRA for large vessel occlusion.  2. Moderate smooth long segment stenosis involving the mid-distal right M1 segment.  3. Moderate atherosclerotic change elsewhere within the intracranial circulation. No proximal high-grade or correctable stenosis.    Transthoracic Echocardiogram  00/00/2020 Pending  Bilateral Carotid Dopplers  00/00/2020 Pending  EKG - SR rate 77 BPM. (See cardiology reading for complete details)   PHYSICAL EXAM  Temp:  [97.3 F (36.3 C)-98.1 F (36.7 C)] 97.7 F (36.5 C) (06/14 0806) Pulse Rate:  [56-91] 56 (06/14 0806) Resp:  [14-21] 16 (06/14 0806) BP: (165-207)/(52-118) 166/61 (06/14 0806) SpO2:  [98 %-100 %] 100 % (06/14 0806) Weight:  [64.6 kg] 64.6 kg (06/13 2330)  General - Well nourished, well developed, in no apparent distress.  Ophthalmologic - fundi not visualized due to noncooperation.  Cardiovascular - Regular rate and rhythm.  Mental Status -  Level of arousal and orientation to time, place, and person were intact. Language including expression, naming, repetition, comprehension was assessed and found intact.  Cranial Nerves II - XII - II -  Visual field intact OU. III, IV, VI - Extraocular movements intact. V - Facial sensation intact bilaterally. VII - mild left facial droop. VIII - Hearing & vestibular intact bilaterally. X - Palate elevates symmetrically, mild dysarthria. XI - Chin turning & shoulder shrug intact bilaterally. XII - Tongue protrusion intact.  Motor Strength - The patient's strength was normal in all extremities and pronator drift was absent except left proximal 4/5, pt stated it was chronic due to left shoulder arthritis.  Bulk was normal and fasciculations were absent.   Motor Tone - Muscle tone was assessed at the neck and appendages and was  normal.  Reflexes - The patient's reflexes were symmetrical in all extremities and she had no pathological reflexes.  Sensory - Light touch, temperature/pinprick were assessed and were symmetrical.    Coordination - The patient had normal movements in the hands with no ataxia or dysmetria.  Tremor was absent.  Gait and Station - deferred.    ASSESSMENT/PLAN [name] is a 85 y.o. female with history of HTN, HLD, CKD, intermittent confusion and prior stroke presenting with left facial weakness. She did not receive IV t-PA due to mild deficits.   Stroke:  Right MCA cortical and insular small strokes as well as punctate left frontal WM infarct - most likely related to right M1 and M2 moderate to high grade stenosis but cardioembolic source also possible.  Resultant  Mild left facial droop with mild dysarthria  CT head - not performed  MRI head - Acute nonhemorrhagic cortical infarcts involving the right precentral gyrus and the anterior right insular cortex. Acute punctate nonhemorrhagic subcortical white matter infarct in the left frontal lobe.   MRA head - Moderate right M1 and severe right M2 stenosis. Moderate atherosclerotic change elsewhere within the intracranial circulation  Carotid Doppler - pending  2D Echo - pending  Recommend 30 day cardiac event monitoring to rule out afib  LDL - 156  HgbA1c - 5.4  VTE prophylaxis - Lovenox  Diet  - Heart healthy with thin liquids.  No antithrombotic prior to admission, now on aspirin 325 mg daily. Pt not able to tolerate plavix in the past. Continue ASA 325mg  on discharge.  Patient counseled to be compliant with her antithrombotic medications  Ongoing aggressive stroke risk factor management  Therapy recommendations:  pending  Disposition:  Pending  Hypertension  Blood pressure somewhat high at times but within post stroke parameters  Permissive hypertension (OK if < 220/120) but gradually normalize in 3-5  days . Long-term BP goal normotensive  Hyperlipidemia  Lipid lowering medication PTA:  none  LDL 156, goal < 70  Current lipid lowering medication: Lipitor 40 mg daily  Continue statin at discharge  Other Stroke Risk Factors  Advanced age  Hx stroke/TIA  Other Active Problems  Plavix intolerance in past due to side effect of malaise  CKD stage III - Cre 1.3->1.2   Hospital day # 0  Neurology will sign off. Please call with questions. Pt will follow up with stroke clinic NP at [facility] in about 4 weeks. Thanks for the consult.  Rosalin Hawking, MD PhD Stroke Neurology 10/30/2018 12:03 PM    To contact Stroke Continuity provider, please refer to [email or web address] After hours, contact General Neurology

## 2018-10-30 NOTE — Evaluation (Signed)
Speech Language Pathology Evaluation Patient Details Name: Toni Gomez MRN: [id number] DOB: [birth year] Today's Date: 10/30/2018 Time: 1100-1136 SLP Time Calculation (min) (ACUTE ONLY): 36 min  Problem List:  Patient Active Problem List   Diagnosis Date Noted  . Stroke ([place]) 10/29/2018  . Hypercalcemia 10/29/2018  . Allergic conjunctivitis of both eyes 07/25/2018  . Hyperlipidemia 03/31/2018  . History of stroke 03/28/2018  . CKD (chronic kidney disease) stage 3, GFR 30-59 ml/min (HCC)   . Fall   . Stroke (cerebrum) ([place])   . Abnormal EKG 03/27/2018  . Syncope and collapse 03/27/2018  . Acute encephalopathy 03/27/2018  . Altered mental status 03/26/2018  . Rhabdomyolysis 03/26/2018  . Hypertension 03/26/2018   Past Medical History:  Past Medical History:  Diagnosis Date  . Elevated troponin 03/26/2018  . Hyperlipidemia   . Hypertension   . Renal disorder    chronic kidney disease  . Rhabdomyolysis   . Stroke [facility])    Past Surgical History:  Past Surgical History:  Procedure Laterality Date  . BREAST SURGERY  1980   R breast removed, not cancer   HPI:  85 y.o. female presented to the ED with left facial droop.  MRI brain 3-4 acute small strokes in right precentral gyrus, anterior right insular cortex and subcortical white matter in the left frontal lobe.    Assessment / Plan / Recommendation Clinical Impression  Pt presents with a mild dysarthria of speech, improved since yesterday.  Spoke with her POA, [name], to discuss baseline function. Pt demonstrates functional cognition with regard to memory, simple verbal problem-solving, and attention.  Expressive and receptive language are [facility]  Pt lives alone, still drives and does grocery shopping.  Family/friends check in on her daily.  Pt was appropriately emotional about dx of stroke, concerned that she will not be able to live as independently as before. Await OT/PT recommendations for disposition. She may need 24 hr  support initially.  SLP will follow briefly to ensure that pt can meet communication needs independently.         SLP Assessment  SLP Recommendation/Assessment: Patient needs continued Speech Lanaguage Pathology Services SLP Visit Diagnosis: Cognitive communication deficit (R41.841)    Follow Up Recommendations  Other (comment)(tba)    Frequency and Duration min 2x/week  1 week      SLP Evaluation Cognition  Overall Cognitive Status: Within Functional Limits for tasks assessed Arousal/Alertness: Awake/alert Orientation Level: Oriented X4 Attention: Sustained Sustained Attention: Appears intact Memory: Appears intact(recalled 3/3 words) Awareness: Appears intact Problem Solving: Appears intact Safety/Judgment: Appears intact       Comprehension  Auditory Comprehension Overall Auditory Comprehension: Appears within functional limits for tasks assessed Reading Comprehension Reading Status: Not tested    Expression Expression Primary Mode of Expression: Verbal Verbal Expression Overall Verbal Expression: Appears within functional limits for tasks assessed   Oral / Motor  Oral Motor/Sensory Function Overall Oral Motor/Sensory Function: Mild impairment Facial Symmetry: Abnormal symmetry left(mild left asymmetry) Motor Speech Overall Motor Speech: (mild dysarthria)   GO                    Assunta Curtis 10/30/2018, 1:08 PM   Donya Tomaro L. Tivis Ringer, [place] Office number [phone] Pager [phone]

## 2018-10-30 NOTE — Discharge Summary (Addendum)
Discharge Summary  Toni Gomez HFW:[id number] DOB: Oct 25, 1923  PCP: [name], DO  Admit date: 10/29/2018 Discharge date: 10/30/2018  Time spent: 15mins, more than 50% time spent on coordination of care.  Recommendations for Outpatient Follow-up:  1. F/u with PCP within a week  for hospital discharge follow up, repeat cbc/bmp at follow up 2. F/u with neurology for recurrent stroke 3. Cardiology to arrange 30day event monitor to rule out afib, message sent to cardiology through epic, f/u with cardiology for aortic stenosis  4. Home health ordered 5. Family updated   Discharge Diagnoses:  Active Hospital Problems   Diagnosis Date Noted   Recurrent strokes ([place]) 10/29/2018   Hypercalcemia 10/29/2018   Hyperlipidemia 03/31/2018   CKD (chronic kidney disease) stage 3, GFR 30-59 ml/min (HCC)    Hypertension 03/26/2018    Resolved Hospital Problems  No resolved problems to display.    Discharge Condition: stable  Diet recommendation: heart healthy  Filed Weights   10/29/18 2330  Weight: 64.6 kg    History of present illness: (per admitting MD Dr Blaine Hamper) PCP: [name], DO   Patient coming from:  The patient is coming from home.  At baseline, pt is dependent for most of ADL.        Chief Complaint: Left facial droop, slurred speech  HPI: Toni Gomez is a 85 y.o. female with medical history significant of hypertension, hyperlipidemia, stroke, CKD stage III, who presents with left facial droop, slurred speech.  Patient's caregiver, patient was last known normal at about 5 PM yesterday, and was noted to have slurred speech and left facial droop at about 2 PM today.  Patient has chronic bilateral leg weakness which has not changed.  No vision change or hearing loss.  She is using walker.  No unilateral numbness or tingling to extremities.  No weakness in arms.  Patient denies chest pain, shortness breath, cough, fever or chills.  No nausea  vomiting, diarrhea, abdominal pain, symptoms of UTI or unilateral weakness. Of note, pt was on plavix for hx of stroke, but stopped taking it due to GI upset.  ED Course: pt was found to have WBC 4.5, negative urinalysis, pending COVID-19 test, stable renal function, blood pressure two 7/77, 199/79, no tachycardia, oxygen saturation 100% on room air, temperature normal.  MRI of brain showed acute stroke.  Patient is placed on telemetry bed for observation.  Dr. Cheral Marker of neurology was consulted  Hospital Course:  Principal Problem:   Recurrent strokes ([place]) Active Problems:   Hypertension   CKD (chronic kidney disease) stage 3, GFR 30-59 ml/min (HCC)   Hyperlipidemia   Hypercalcemia  Recurrent ischemic stroke, -Resultant  Mild left facial droop with mild dysarthria -Right MCA cortical and insular small strokes as well as punctate left frontal WM infarct - most likely related to right M1 and M2 moderate to high grade stenosis but cardioembolic source also possible per neurology  -MRA head - Moderate right M1 and severe right M2 stenosis. Moderate atherosclerotic change elsewhere within the intracranial circulation - Carotic doppler "Right Carotid: Velocities in the right ICA are consistent with a 1-39% stenosis. Left Carotid: Velocities in the left ICA are consistent with a 1-39% stenosis. Vertebrals:  Bilateral vertebral arteries demonstrate antegrade flow. Subclavians: Normal flow hemodynamics were seen in bilateral subclavian  arteries. -Echo on cardiac emboli, lvef wnl, Left ventricular diastolic Doppler parameters are consistent with impaired  relaxation. Moderate arotic stenosis -a1c 5.4 -ldl 156 - pt was on  plavix for hx of stroke, but stopped taking it due to GI upset -neurology recommended to start asa 325mg  daily, statin and outpatient cardiac monitor to rule out afib  HTN:  Home bp meds held in the hospital to allow permission hypertenion Resume home bp meds norvasc, pcp follow  up  HLD: not on statin previously, she is started on lipitor 40mg  daily  Hypercalcemia: mild, Ca 10.5.  Possibly due to dehydration.   treat with IV fluid. Follow up with pcp.  CKDIII  stable at baseline  H/o bilateral knee replacement    Code Status: DNR per POA  Family Communication: Yes, niece over the phone Disposition Plan:  home with home health  Procedures:  none  Consultations:  neurology  Discharge Exam: BP (!) 171/64 (BP Location: Left Arm)    Pulse 61    Temp 97.7 F (36.5 C) (Oral)    Resp 15    Ht 5\' 1"  (1.549 m)    Wt 64.6 kg    SpO2 98%    BMI 26.91 kg/m   General: NAd, aaox3 Cardiovascular: RRR Respiratory: CTABL  Discharge Instructions You were cared for by a hospitalist during your hospital stay. If you have any questions about your discharge medications or the care you received while you were in the hospital after you are discharged, you can call the unit and asked to speak with the hospitalist on call if the hospitalist that took care of you is not available. Once you are discharged, your primary care physician will handle any further medical issues. Please note that NO REFILLS for any discharge medications will be authorized once you are discharged, as it is imperative that you return to your primary care physician (or establish a relationship with a primary care physician if you do not have one) for your aftercare needs so that they can reassess your need for medications and monitor your lab values.  Discharge Instructions    Ambulatory referral to Neurology   Complete by: As directed    Follow up with stroke clinic NP (Jessica Vanschaick or Cecille Rubin, if both not available, consider Zachery Dauer, or Ahern) at [facility] in about 4 weeks. Thanks.   Diet - low sodium heart healthy   Complete by: As directed    Increase activity slowly   Complete by: As directed      Allergies as of 10/30/2018   No Known Allergies     Medication List    TAKE  these medications   acetaminophen 650 MG CR tablet Commonly known as: TYLENOL Take 650 mg by mouth every 8 (eight) hours as needed for pain.   amLODipine 5 MG tablet Commonly known as: NORVASC Take 1 tablet (5 mg total) by mouth daily.   aspirin EC 325 MG tablet Take 1 tablet (325 mg total) by mouth daily.   atorvastatin 40 MG tablet Commonly known as: LIPITOR Take 1 tablet (40 mg total) by mouth daily at 6 PM.   olopatadine 0.1 % ophthalmic solution Commonly known as: PATANOL Place 1 drop into both eyes 2 (two) times daily. (for eye allergies) What changed:   when to take this  reasons to take this  additional instructions      No Known Allergies Follow-up Information    [facility]. Schedule an appointment as soon as possible for a visit in 4 week(s).   Specialty: Neurology Contact information: [address] [place],  Ashly M, DO Follow up in 1 week(s).   Specialty: Family Medicine Why: hospital discharge follow up Contact information: [place] [address] [phone]        Herminio Commons, MD Follow up.   Specialty: Cardiology Why: for aortic stenosis  Contact information: [place] [address] [phone]            The results of significant diagnostics from this hospitalization (including imaging, microbiology, ancillary and laboratory) are listed below for reference.    Significant Diagnostic Studies: Mr Brain Wo Contrast  Result Date: 10/29/2018 CLINICAL DATA:  Focal neuro deficit for greater than 6 hours, stroke suspected. Patient's family noted slurred speech and asymmetric face today. EXAM: MRI HEAD WITHOUT CONTRAST TECHNIQUE: Multiplanar, multiecho pulse sequences of the brain and surrounding structures were obtained without intravenous contrast. COMPARISON:  MRI brain 03/28/2018 FINDINGS: Brain: Focal areas of  cortical restricted diffusion noted in the anterior right insular cortex and along the right precentral gyrus. There is a punctate focus of restricted diffusion in the subcortical white matter of the left frontal lobe. T2 signal changes are associated with the areas of acute restricted diffusion. Additional bilateral T2 hyperintensities are present within the corona radiata. There are is a remote lacunar infarct in the left thalamus. No other focal cortical infarcts are present. The brainstem and cerebellum are normal. Insert normal IAC moderate generalized atrophy is present. The ventricles are proportionate to the degree of atrophy. No significant extraaxial fluid collection is present. Vascular: Flow is present in the major intracranial arteries. Skull and upper cervical spine: Multilevel degenerative changes are present in the cervical spine with chronic loss of disc height C2-3 through C5-6. Endplate changes are noted. The craniocervical junction is normal. Midline structures are otherwise unremarkable. Sinuses/Orbits: The paranasal sinuses and mastoid air cells are clear. Bilateral lens replacements are noted. Globes and orbits are otherwise unremarkable. IMPRESSION: 1. Acute nonhemorrhagic cortical infarcts involving the right precentral gyrus compatible with left-sided facial or extremity symptoms. 2. Acute nonhemorrhagic cortical infarct involving the anterior right insular cortex. 3. Acute punctate nonhemorrhagic subcortical white matter infarct in the left frontal lobe. 4. Moderate atrophy and periventricular white matter changes otherwise likely reflect the sequela of chronic microvascular ischemia. Electronically Signed   By: San Morelle M.D.   On: 10/29/2018 21:22   Mr Jodene Nam Head Wo Contrast  Result Date: 10/30/2018 CLINICAL DATA:  Initial evaluation for acute stroke. EXAM: MRA HEAD WITHOUT CONTRAST TECHNIQUE: Angiographic images of the Circle of Willis were obtained using MRA technique  without intravenous contrast. COMPARISON:  Prior brain MRI from 10/29/2018. FINDINGS: ANTERIOR CIRCULATION: Examination mildly degraded by motion artifact. Distal cervical segments of the internal carotid arteries are patent with symmetric antegrade flow. Scattered atheromatous irregularity within the petrous, cavernous, and supraclinoid ICAs without hemodynamically significant stenosis. A1 segments widely patent. Normal anterior communicating artery. Anterior cerebral arteries patent to their distal aspects without flow-limiting stenosis. Left M1 segment widely patent. Normal left MCA bifurcation. Left MCA branches well perfused to their distal aspects. Right M1 widely patent at its proximal aspect. There is smooth moderate diffuse narrowing of the mid-distal right M1. No visible FLAIR signal abnormality on prior brain MRI to suggest dissection. Normal right MCA bifurcation. Right MCA branches well perfused distally. POSTERIOR CIRCULATION: Vertebral arteries patent to the vertebrobasilar junction without stenosis. Left vertebral artery slightly dominant. Posterior inferior cerebral arteries patent bilaterally. Basilar patent to its distal aspect without stenosis. Superior cerebral arteries patent bilaterally.  Both of the posterior cerebral arteries primarily supplied via the basilar. Moderate atheromatous change throughout the PCAs bilaterally with mild diffuse narrowing without focal high-grade stenosis, left slightly worse than right. PCAs are perfused to their distal aspects. No intracranial aneurysm. Distal small vessel atheromatous irregularity noted throughout the intracranial circulation. IMPRESSION: 1. Negative intracranial MRA for large vessel occlusion. 2. Moderate smooth long segment stenosis involving the mid-distal right M1 segment. 3. Moderate atherosclerotic change elsewhere within the intracranial circulation. No proximal high-grade or correctable stenosis. Electronically Signed   By: Jeannine Boga M.D.   On: 10/30/2018 05:36   Vas US Carotid (at [place] Only)  Result Date: 10/30/2018 Carotid Arterial Duplex Study Indications:       CVA. Risk Factors:      Hypertension, hyperlipidemia. Comparison Study:  Carotid artery duplex 03/28/2018 Performing Technologist: Sharion Dove RVS  Examination Guidelines: A complete evaluation includes B-mode imaging, spectral Doppler, color Doppler, and power Doppler as needed of all accessible portions of each vessel. Bilateral testing is considered an integral part of a complete examination. Limited examinations for reoccurring indications may be performed as noted.  Right Carotid Findings: +----------+--------+--------+--------+--------+------------------+             PSV cm/s EDV cm/s Stenosis Describe Comments            +----------+--------+--------+--------+--------+------------------+  CCA Prox   75       12                         intimal thickening  +----------+--------+--------+--------+--------+------------------+  CCA Distal 71       12                         intimal thickening  +----------+--------+--------+--------+--------+------------------+  ICA Prox   59       11                calcific                     +----------+--------+--------+--------+--------+------------------+  ICA Distal 53       15                                             +----------+--------+--------+--------+--------+------------------+  ECA        53       7                                              +----------+--------+--------+--------+--------+------------------+ +----------+--------+-------+----------------+-------------------+             PSV cm/s EDV cms Describe         Arm Pressure (mmHG)  +----------+--------+-------+----------------+-------------------+  Subclavian 53               Multiphasic, WNL                      +----------+--------+-------+----------------+-------------------+ +---------+--------+--+--------+--+---------+  Vertebral PSV cm/s 60 EDV  cm/s 14 Antegrade  +---------+--------+--+--------+--+---------+  Left Carotid Findings: +----------+--------+--------+--------+--------+------------------+             PSV cm/s EDV cm/s Stenosis Describe Comments            +----------+--------+--------+--------+--------+------------------+  CCA Prox   67       9                          intimal thickening  +----------+--------+--------+--------+--------+------------------+  CCA Distal 74       15                         intimal thickening  +----------+--------+--------+--------+--------+------------------+  ICA Prox   66       17                calcific                     +----------+--------+--------+--------+--------+------------------+  ICA Distal 55       11                                             +----------+--------+--------+--------+--------+------------------+  ECA        103      4                                              +----------+--------+--------+--------+--------+------------------+ +----------+--------+--------+--------+-------------------+  Subclavian PSV cm/s EDV cm/s Describe Arm Pressure (mmHG)  +----------+--------+--------+--------+-------------------+             117                                             +----------+--------+--------+--------+-------------------+ +---------+--------+--+--------+--+  Vertebral PSV cm/s 81 EDV cm/s 14  +---------+--------+--+--------+--+  Summary: Right Carotid: Velocities in the right ICA are consistent with a 1-39% stenosis. Left Carotid: Velocities in the left ICA are consistent with a 1-39% stenosis. Vertebrals:  Bilateral vertebral arteries demonstrate antegrade flow. Subclavians: Normal flow hemodynamics were seen in bilateral subclavian              arteries. *See table(s) above for measurements and observations.     Preliminary     Microbiology: Recent Results (from the past 240 hour(s))  SARS Coronavirus 2     Status: None   Collection Time: 10/29/18 11:00 PM  Result Value Ref  Range Status   SARS Coronavirus 2 NOT DETECTED NOT DETECTED Final    Comment: (NOTE) SARS-CoV-2 target nucleic acids are NOT DETECTED. The SARS-CoV-2 RNA is generally detectable in upper and lower respiratory specimens during the acute phase of infection.  Negative  results do not preclude SARS-CoV-2 infection, do not rule out co-infections with other pathogens, and should not be used as the sole basis for treatment or other patient management decisions.  Negative results must be combined with clinical observations, patient history, and epidemiological information. The expected result is Not Detected. Fact Sheet for Patients: http://www.biofiredefense.com/wp-content/uploads/2020/03/BIOFIRE-COVID -19-patients.pdf Fact Sheet for Healthcare Providers: http://www.biofiredefense.com/wp-content/uploads/2020/03/BIOFIRE-COVID -19-hcp.pdf This test is not yet approved or cleared by the [place] and  has been authorized for detection and/or diagnosis of SARS-CoV-2 by FDA under an Emergency Use Authorization (EUA).  This EUA will remain in effec t (meaning this test can be used) for the duration of  the COVID-19 declaration under Section 564(b)(1)  of the Act, 21 U.S.C. section 360bbb-3(b)(1), unless the authorization is terminated or revoked sooner. Performed at [facility], [place] [address], [place], [place] [address]      Labs: Basic Metabolic Panel: Recent Labs  Lab 10/29/18 1905 10/29/18 1912  NA 139 141  K 3.8 3.8  CL 108 109  CO2 23  --   GLUCOSE 105* 103*  BUN 18 18  CREATININE 1.30* 1.20*  CALCIUM 10.5*  --    Liver Function Tests: Recent Labs  Lab 10/29/18 1905  AST 19  ALT 11  ALKPHOS 64  BILITOT 0.5  PROT 7.0  ALBUMIN 3.9   No results for input(s): LIPASE, AMYLASE in the last 168 hours. No results for input(s): AMMONIA in the last 168 hours. CBC: Recent Labs  Lab 10/29/18 1905 10/29/18 1912  WBC 4.5  --   NEUTROABS 2.5  --   HGB 13.2  13.9  HCT 42.8 41.0  MCV 84.8  --   PLT 268  --    Cardiac Enzymes: No results for input(s): CKTOTAL, CKMB, CKMBINDEX, TROPONINI in the last 168 hours. BNP: BNP (last 3 results) Recent Labs    03/26/18 2016  BNP 455.0*    ProBNP (last 3 results) No results for input(s): PROBNP in the last 8760 hours.  CBG: No results for input(s): GLUCAP in the last 168 hours.     Signed:  Florencia Reasons MD, PhD  [facility] 10/30/2018, 2:04 PM

## 2018-10-30 NOTE — Progress Notes (Signed)
VASCULAR LAB PRELIMINARY  PRELIMINARY  PRELIMINARY  PRELIMINARY  Carotid duplex completed.    Preliminary report:  See CV proc for preliminary results.  Zyon Rosser, RVT 10/30/2018, 12:08 PM

## 2018-10-30 NOTE — Progress Notes (Signed)
*  PRELIMINARY RESULTS* Echocardiogram 2D Echocardiogram has been performed.  Leavy Cella 10/30/2018, 11:39 AM

## 2018-10-30 NOTE — Evaluation (Signed)
Occupational Therapy Evaluation Patient Details Name: Toni Gomez MRN: [id number] DOB: February 16, 1924 Today's Date: 10/30/2018    History of Present Illness Patient is a 85 y/o female presenting to the ED on 10/29/2018 with primary complaints of Left facial droop, slurred speech. Past medical history significant of hypertension, hyperlipidemia, stroke, CKD stage III. MRI brain revealing Acute nonhemorrhagic cortical infarcts involving the right precentral gyrus compatible with left-sided facial or extremity symptoms.   Clinical Impression   Pt presents to OT with generalized weakness, impaired balance, and decreased safety awareness.  She currently requires min A for ADLs and min - mod A for functional mobility.  Pt is insistent she will be fine once she gets home.  Discussed need for 24 hour assist, as well as recommendation for now driving, and to wear life alert at all times. She states she will consider recommendations.  RN aware and was going to speak with pt's niece.     Follow Up Recommendations  Home health OT;Supervision/Assistance - 24 hour    Equipment Recommendations  None recommended by OT    Recommendations for Other Services       Precautions / Restrictions Precautions Precautions: Fall Restrictions Weight Bearing Restrictions: No      Mobility Bed Mobility Overal bed mobility: Needs Assistance Bed Mobility: Supine to Sit;Sit to Supine     Supine to sit: Min guard;Supervision Sit to supine: Min guard;Supervision   General bed mobility comments: increased time and effort  Transfers Overall transfer level: Needs assistance Equipment used: Rolling walker (2 wheeled) Transfers: Sit to/from [facility] Sit to Stand: Mod assist Stand pivot transfers: Min assist;Min guard       General transfer comment: patient Mod A to stand from bedside with noted postior lean; poor safety awareness wtih transfers with pateint pulling from RW as well as  attempting to sit prematurely    Balance Overall balance assessment: Needs assistance Sitting-balance support: No upper extremity supported;Feet supported Sitting balance-Leahy Scale: Good     Standing balance support: Bilateral upper extremity supported;During functional activity Standing balance-Leahy Scale: Poor Standing balance comment: reliant on UE support                           ADL either performed or assessed with clinical judgement   ADL Overall ADL's : Needs assistance/impaired Eating/Feeding: Independent   Grooming: Wash/dry hands;Wash/dry face;Oral care;Brushing hair;Minimal assistance;Standing   Upper Body Bathing: Set up;Sitting   Lower Body Bathing: Minimal assistance;Sit to/from stand Lower Body Bathing Details (indicate cue type and reason): assist to move sit to stand  Upper Body Dressing : Set up;Sitting   Lower Body Dressing: Minimal assistance;Sit to/from stand Lower Body Dressing Details (indicate cue type and reason): assist to move sit to stand  Toilet Transfer: Minimal assistance;Ambulation;Comfort height toilet;BSC;RW Toilet Transfer Details (indicate cue type and reason): requires assist for balance  Toileting- Clothing Manipulation and Hygiene: Min guard;Sit to/from stand       Functional mobility during ADLs: Minimal assistance;Rolling walker;Moderate assistance General ADL Comments: Pt requires up to mod A for sit to stand.  she insists this is due to floor being too slippery and not having her bedroom shoes      Vision Baseline Vision/History: No visual deficits Patient Visual Report: No change from baseline Vision Assessment?: No apparent visual deficits     Perception Perception Perception Tested?: Yes   Praxis Praxis Praxis tested?: Within functional limits    Pertinent Vitals/Pain Pain  Assessment: No/denies pain     Hand Dominance Right   Extremity/Trunk Assessment Upper Extremity Assessment Upper Extremity  Assessment: Generalized weakness   Lower Extremity Assessment Lower Extremity Assessment: Defer to PT evaluation   Cervical / Trunk Assessment Cervical / Trunk Assessment: Kyphotic   Communication Communication Communication: HOH   Cognition Arousal/Alertness: Awake/alert Behavior During Therapy: WFL for tasks assessed/performed Overall Cognitive Status: No family/caregiver present to determine baseline cognitive functioning                                 General Comments: Pt demonstrates impaired safety awareness and difficulty generalizing current function to home    General Comments  long discussion with pt re: fall risk and need for increased assist at home.  She insists she will be fine once she gets home.  She will not consider SNF.  Recommended to her that she not drive, and that she ask her niece to stay with her when she goes home.   She agreed not to drive for 2 weeks, but said she'd think about having her niece stay.  Also instructed pt to wear her life alert at all times     Exercises     Shoulder Instructions      Home Living Family/patient expects to be discharged to:: Private residence Living Arrangements: Alone Available Help at Discharge: Family;Available PRN/intermittently Type of Home: House Home Access: Level entry     Home Layout: One level     Bathroom Shower/Tub: Teacher, early years/pre: Standard     Home Equipment: Environmental consultant - 2 wheels;Cane - single point;Shower seat;Bedside commode;Wheelchair - manual      Lives With: Alone(family checks on her 2x/day)    Prior Functioning/Environment Level of Independence: Independent with assistive device(s)        Comments: use of RW for community mobility - states she uses w/c around house        OT Problem List: Decreased strength;Decreased activity tolerance;Impaired balance (sitting and/or standing);Decreased safety awareness;Decreased knowledge of use of DME or AE       OT Treatment/Interventions:      OT Goals(Current goals can be found in the care plan section) Acute Rehab OT Goals Patient Stated Goal: return home today OT Goal Formulation: All assessment and education complete, DC therapy  OT Frequency:     Barriers to D/C:            Co-evaluation              AM-PAC OT "6 Clicks" Daily Activity     Outcome Measure Help from another person eating meals?: None Help from another person taking care of personal grooming?: A Little Help from another person toileting, which includes using toliet, bedpan, or urinal?: A Little Help from another person bathing (including washing, rinsing, drying)?: A Little Help from another person to put on and taking off regular upper body clothing?: A Little Help from another person to put on and taking off regular lower body clothing?: A Little 6 Click Score: 19   End of Session Equipment Utilized During Treatment: Rolling walker;Gait belt Nurse Communication: Mobility status  Activity Tolerance: Patient tolerated treatment well Patient left: in bed;with call bell/phone within reach;with bed alarm set  OT Visit Diagnosis: Unsteadiness on feet (R26.81)                Time: 3545-6256 OT Time Calculation (min): 33 min Charges:  OT General Charges $OT Visit: 1 Visit OT Evaluation $OT Eval Moderate Complexity: 1 Mod  Lucille Passy, OTR/L Acute Rehabilitation Services Pager [phone] Office [phone]   Lucille Passy M 10/30/2018, 3:47 PM

## 2018-10-30 NOTE — Care Management (Signed)
Neither patient nor family could remember name of [place]. No name in old notes in chart review or [id number]. Spoke w her PCP who did not know name of [place] either. Checked with [facility], [facility], [facility], [facility], [facility], and [facility], patient not active.  Spoke again with [name] her niece who is agreeable to a referral to [facility]. Referral made to [facility]

## 2018-10-31 DIAGNOSIS — R531 Weakness: Secondary | ICD-10-CM | POA: Diagnosis not present

## 2018-10-31 DIAGNOSIS — R5381 Other malaise: Secondary | ICD-10-CM | POA: Diagnosis not present

## 2018-10-31 DIAGNOSIS — W19XXXA Unspecified fall, initial encounter: Secondary | ICD-10-CM | POA: Diagnosis not present

## 2018-11-01 ENCOUNTER — Telehealth: Payer: Self-pay | Admitting: *Deleted

## 2018-11-01 ENCOUNTER — Other Ambulatory Visit: Payer: Self-pay | Admitting: Cardiology

## 2018-11-01 DIAGNOSIS — I69392 Facial weakness following cerebral infarction: Secondary | ICD-10-CM | POA: Diagnosis not present

## 2018-11-01 DIAGNOSIS — N183 Chronic kidney disease, stage 3 (moderate): Secondary | ICD-10-CM | POA: Diagnosis not present

## 2018-11-01 DIAGNOSIS — I69398 Other sequelae of cerebral infarction: Secondary | ICD-10-CM | POA: Diagnosis not present

## 2018-11-01 DIAGNOSIS — I639 Cerebral infarction, unspecified: Secondary | ICD-10-CM

## 2018-11-01 DIAGNOSIS — I350 Nonrheumatic aortic (valve) stenosis: Secondary | ICD-10-CM | POA: Diagnosis not present

## 2018-11-01 DIAGNOSIS — I129 Hypertensive chronic kidney disease with stage 1 through stage 4 chronic kidney disease, or unspecified chronic kidney disease: Secondary | ICD-10-CM | POA: Diagnosis not present

## 2018-11-01 DIAGNOSIS — E785 Hyperlipidemia, unspecified: Secondary | ICD-10-CM | POA: Diagnosis not present

## 2018-11-01 DIAGNOSIS — R296 Repeated falls: Secondary | ICD-10-CM | POA: Diagnosis not present

## 2018-11-01 DIAGNOSIS — I69322 Dysarthria following cerebral infarction: Secondary | ICD-10-CM | POA: Diagnosis not present

## 2018-11-01 NOTE — Telephone Encounter (Signed)
Due to problems with hearing patient gave permission to speak with her nephew.  [employer] to ship a 30 day cardiac event monitor to her home.  Instructions reviewed briefly as they are included in the monitor kit.

## 2018-11-01 NOTE — Telephone Encounter (Signed)
, °

## 2018-11-02 DIAGNOSIS — I69398 Other sequelae of cerebral infarction: Secondary | ICD-10-CM | POA: Diagnosis not present

## 2018-11-02 DIAGNOSIS — R296 Repeated falls: Secondary | ICD-10-CM | POA: Diagnosis not present

## 2018-11-02 DIAGNOSIS — I129 Hypertensive chronic kidney disease with stage 1 through stage 4 chronic kidney disease, or unspecified chronic kidney disease: Secondary | ICD-10-CM | POA: Diagnosis not present

## 2018-11-02 DIAGNOSIS — I69322 Dysarthria following cerebral infarction: Secondary | ICD-10-CM | POA: Diagnosis not present

## 2018-11-02 DIAGNOSIS — N183 Chronic kidney disease, stage 3 (moderate): Secondary | ICD-10-CM | POA: Diagnosis not present

## 2018-11-02 DIAGNOSIS — E785 Hyperlipidemia, unspecified: Secondary | ICD-10-CM | POA: Diagnosis not present

## 2018-11-02 DIAGNOSIS — I350 Nonrheumatic aortic (valve) stenosis: Secondary | ICD-10-CM | POA: Diagnosis not present

## 2018-11-02 DIAGNOSIS — I69392 Facial weakness following cerebral infarction: Secondary | ICD-10-CM | POA: Diagnosis not present

## 2018-11-05 DIAGNOSIS — I129 Hypertensive chronic kidney disease with stage 1 through stage 4 chronic kidney disease, or unspecified chronic kidney disease: Secondary | ICD-10-CM | POA: Diagnosis not present

## 2018-11-05 DIAGNOSIS — R296 Repeated falls: Secondary | ICD-10-CM | POA: Diagnosis not present

## 2018-11-05 DIAGNOSIS — E785 Hyperlipidemia, unspecified: Secondary | ICD-10-CM | POA: Diagnosis not present

## 2018-11-05 DIAGNOSIS — I350 Nonrheumatic aortic (valve) stenosis: Secondary | ICD-10-CM | POA: Diagnosis not present

## 2018-11-05 DIAGNOSIS — I69398 Other sequelae of cerebral infarction: Secondary | ICD-10-CM | POA: Diagnosis not present

## 2018-11-05 DIAGNOSIS — I69392 Facial weakness following cerebral infarction: Secondary | ICD-10-CM | POA: Diagnosis not present

## 2018-11-05 DIAGNOSIS — I69322 Dysarthria following cerebral infarction: Secondary | ICD-10-CM | POA: Diagnosis not present

## 2018-11-05 DIAGNOSIS — N183 Chronic kidney disease, stage 3 (moderate): Secondary | ICD-10-CM | POA: Diagnosis not present

## 2018-11-07 DIAGNOSIS — I129 Hypertensive chronic kidney disease with stage 1 through stage 4 chronic kidney disease, or unspecified chronic kidney disease: Secondary | ICD-10-CM | POA: Diagnosis not present

## 2018-11-07 DIAGNOSIS — R296 Repeated falls: Secondary | ICD-10-CM | POA: Diagnosis not present

## 2018-11-07 DIAGNOSIS — I69398 Other sequelae of cerebral infarction: Secondary | ICD-10-CM | POA: Diagnosis not present

## 2018-11-07 DIAGNOSIS — I350 Nonrheumatic aortic (valve) stenosis: Secondary | ICD-10-CM | POA: Diagnosis not present

## 2018-11-07 DIAGNOSIS — I69392 Facial weakness following cerebral infarction: Secondary | ICD-10-CM | POA: Diagnosis not present

## 2018-11-07 DIAGNOSIS — I69322 Dysarthria following cerebral infarction: Secondary | ICD-10-CM | POA: Diagnosis not present

## 2018-11-07 DIAGNOSIS — E785 Hyperlipidemia, unspecified: Secondary | ICD-10-CM | POA: Diagnosis not present

## 2018-11-07 DIAGNOSIS — N183 Chronic kidney disease, stage 3 (moderate): Secondary | ICD-10-CM | POA: Diagnosis not present

## 2018-11-08 DIAGNOSIS — I129 Hypertensive chronic kidney disease with stage 1 through stage 4 chronic kidney disease, or unspecified chronic kidney disease: Secondary | ICD-10-CM | POA: Diagnosis not present

## 2018-11-08 DIAGNOSIS — E785 Hyperlipidemia, unspecified: Secondary | ICD-10-CM | POA: Diagnosis not present

## 2018-11-08 DIAGNOSIS — I69398 Other sequelae of cerebral infarction: Secondary | ICD-10-CM | POA: Diagnosis not present

## 2018-11-08 DIAGNOSIS — R296 Repeated falls: Secondary | ICD-10-CM | POA: Diagnosis not present

## 2018-11-08 DIAGNOSIS — I350 Nonrheumatic aortic (valve) stenosis: Secondary | ICD-10-CM | POA: Diagnosis not present

## 2018-11-08 DIAGNOSIS — N183 Chronic kidney disease, stage 3 (moderate): Secondary | ICD-10-CM | POA: Diagnosis not present

## 2018-11-08 DIAGNOSIS — I69392 Facial weakness following cerebral infarction: Secondary | ICD-10-CM | POA: Diagnosis not present

## 2018-11-08 DIAGNOSIS — I69322 Dysarthria following cerebral infarction: Secondary | ICD-10-CM | POA: Diagnosis not present

## 2018-11-10 DIAGNOSIS — I69322 Dysarthria following cerebral infarction: Secondary | ICD-10-CM | POA: Diagnosis not present

## 2018-11-10 DIAGNOSIS — E785 Hyperlipidemia, unspecified: Secondary | ICD-10-CM | POA: Diagnosis not present

## 2018-11-10 DIAGNOSIS — R296 Repeated falls: Secondary | ICD-10-CM | POA: Diagnosis not present

## 2018-11-10 DIAGNOSIS — I350 Nonrheumatic aortic (valve) stenosis: Secondary | ICD-10-CM | POA: Diagnosis not present

## 2018-11-10 DIAGNOSIS — N183 Chronic kidney disease, stage 3 (moderate): Secondary | ICD-10-CM | POA: Diagnosis not present

## 2018-11-10 DIAGNOSIS — I69392 Facial weakness following cerebral infarction: Secondary | ICD-10-CM | POA: Diagnosis not present

## 2018-11-10 DIAGNOSIS — I69398 Other sequelae of cerebral infarction: Secondary | ICD-10-CM | POA: Diagnosis not present

## 2018-11-10 DIAGNOSIS — I129 Hypertensive chronic kidney disease with stage 1 through stage 4 chronic kidney disease, or unspecified chronic kidney disease: Secondary | ICD-10-CM | POA: Diagnosis not present

## 2018-11-11 DIAGNOSIS — N183 Chronic kidney disease, stage 3 (moderate): Secondary | ICD-10-CM | POA: Diagnosis not present

## 2018-11-11 DIAGNOSIS — I69392 Facial weakness following cerebral infarction: Secondary | ICD-10-CM | POA: Diagnosis not present

## 2018-11-11 DIAGNOSIS — I350 Nonrheumatic aortic (valve) stenosis: Secondary | ICD-10-CM | POA: Diagnosis not present

## 2018-11-11 DIAGNOSIS — I69398 Other sequelae of cerebral infarction: Secondary | ICD-10-CM | POA: Diagnosis not present

## 2018-11-11 DIAGNOSIS — R296 Repeated falls: Secondary | ICD-10-CM | POA: Diagnosis not present

## 2018-11-11 DIAGNOSIS — I69322 Dysarthria following cerebral infarction: Secondary | ICD-10-CM | POA: Diagnosis not present

## 2018-11-11 DIAGNOSIS — E785 Hyperlipidemia, unspecified: Secondary | ICD-10-CM | POA: Diagnosis not present

## 2018-11-11 DIAGNOSIS — I129 Hypertensive chronic kidney disease with stage 1 through stage 4 chronic kidney disease, or unspecified chronic kidney disease: Secondary | ICD-10-CM | POA: Diagnosis not present

## 2018-11-12 ENCOUNTER — Ambulatory Visit (INDEPENDENT_AMBULATORY_CARE_PROVIDER_SITE_OTHER): Payer: Medicare Other

## 2018-11-12 DIAGNOSIS — I350 Nonrheumatic aortic (valve) stenosis: Secondary | ICD-10-CM | POA: Diagnosis not present

## 2018-11-12 DIAGNOSIS — E785 Hyperlipidemia, unspecified: Secondary | ICD-10-CM | POA: Diagnosis not present

## 2018-11-12 DIAGNOSIS — I69392 Facial weakness following cerebral infarction: Secondary | ICD-10-CM | POA: Diagnosis not present

## 2018-11-12 DIAGNOSIS — I69398 Other sequelae of cerebral infarction: Secondary | ICD-10-CM | POA: Diagnosis not present

## 2018-11-12 DIAGNOSIS — I69322 Dysarthria following cerebral infarction: Secondary | ICD-10-CM | POA: Diagnosis not present

## 2018-11-12 DIAGNOSIS — N183 Chronic kidney disease, stage 3 (moderate): Secondary | ICD-10-CM | POA: Diagnosis not present

## 2018-11-12 DIAGNOSIS — I4891 Unspecified atrial fibrillation: Secondary | ICD-10-CM

## 2018-11-12 DIAGNOSIS — I639 Cerebral infarction, unspecified: Secondary | ICD-10-CM | POA: Diagnosis not present

## 2018-11-12 DIAGNOSIS — I129 Hypertensive chronic kidney disease with stage 1 through stage 4 chronic kidney disease, or unspecified chronic kidney disease: Secondary | ICD-10-CM | POA: Diagnosis not present

## 2018-11-12 DIAGNOSIS — R296 Repeated falls: Secondary | ICD-10-CM | POA: Diagnosis not present

## 2018-11-14 DIAGNOSIS — N183 Chronic kidney disease, stage 3 (moderate): Secondary | ICD-10-CM | POA: Diagnosis not present

## 2018-11-14 DIAGNOSIS — E785 Hyperlipidemia, unspecified: Secondary | ICD-10-CM | POA: Diagnosis not present

## 2018-11-14 DIAGNOSIS — I69392 Facial weakness following cerebral infarction: Secondary | ICD-10-CM | POA: Diagnosis not present

## 2018-11-14 DIAGNOSIS — I350 Nonrheumatic aortic (valve) stenosis: Secondary | ICD-10-CM | POA: Diagnosis not present

## 2018-11-14 DIAGNOSIS — I69322 Dysarthria following cerebral infarction: Secondary | ICD-10-CM | POA: Diagnosis not present

## 2018-11-14 DIAGNOSIS — I69398 Other sequelae of cerebral infarction: Secondary | ICD-10-CM | POA: Diagnosis not present

## 2018-11-14 DIAGNOSIS — R296 Repeated falls: Secondary | ICD-10-CM | POA: Diagnosis not present

## 2018-11-14 DIAGNOSIS — I129 Hypertensive chronic kidney disease with stage 1 through stage 4 chronic kidney disease, or unspecified chronic kidney disease: Secondary | ICD-10-CM | POA: Diagnosis not present

## 2018-11-15 DIAGNOSIS — I350 Nonrheumatic aortic (valve) stenosis: Secondary | ICD-10-CM | POA: Diagnosis not present

## 2018-11-15 DIAGNOSIS — I69392 Facial weakness following cerebral infarction: Secondary | ICD-10-CM | POA: Diagnosis not present

## 2018-11-15 DIAGNOSIS — I69398 Other sequelae of cerebral infarction: Secondary | ICD-10-CM | POA: Diagnosis not present

## 2018-11-15 DIAGNOSIS — I129 Hypertensive chronic kidney disease with stage 1 through stage 4 chronic kidney disease, or unspecified chronic kidney disease: Secondary | ICD-10-CM | POA: Diagnosis not present

## 2018-11-15 DIAGNOSIS — E785 Hyperlipidemia, unspecified: Secondary | ICD-10-CM | POA: Diagnosis not present

## 2018-11-15 DIAGNOSIS — R296 Repeated falls: Secondary | ICD-10-CM | POA: Diagnosis not present

## 2018-11-15 DIAGNOSIS — N183 Chronic kidney disease, stage 3 (moderate): Secondary | ICD-10-CM | POA: Diagnosis not present

## 2018-11-15 DIAGNOSIS — I69322 Dysarthria following cerebral infarction: Secondary | ICD-10-CM | POA: Diagnosis not present

## 2018-11-16 DIAGNOSIS — I69392 Facial weakness following cerebral infarction: Secondary | ICD-10-CM | POA: Diagnosis not present

## 2018-11-16 DIAGNOSIS — R296 Repeated falls: Secondary | ICD-10-CM | POA: Diagnosis not present

## 2018-11-16 DIAGNOSIS — I69398 Other sequelae of cerebral infarction: Secondary | ICD-10-CM | POA: Diagnosis not present

## 2018-11-16 DIAGNOSIS — I129 Hypertensive chronic kidney disease with stage 1 through stage 4 chronic kidney disease, or unspecified chronic kidney disease: Secondary | ICD-10-CM | POA: Diagnosis not present

## 2018-11-16 DIAGNOSIS — I350 Nonrheumatic aortic (valve) stenosis: Secondary | ICD-10-CM | POA: Diagnosis not present

## 2018-11-16 DIAGNOSIS — I69322 Dysarthria following cerebral infarction: Secondary | ICD-10-CM | POA: Diagnosis not present

## 2018-11-16 DIAGNOSIS — E785 Hyperlipidemia, unspecified: Secondary | ICD-10-CM | POA: Diagnosis not present

## 2018-11-16 DIAGNOSIS — N183 Chronic kidney disease, stage 3 (moderate): Secondary | ICD-10-CM | POA: Diagnosis not present

## 2018-11-17 DIAGNOSIS — N183 Chronic kidney disease, stage 3 (moderate): Secondary | ICD-10-CM | POA: Diagnosis not present

## 2018-11-17 DIAGNOSIS — E785 Hyperlipidemia, unspecified: Secondary | ICD-10-CM | POA: Diagnosis not present

## 2018-11-17 DIAGNOSIS — I69322 Dysarthria following cerebral infarction: Secondary | ICD-10-CM | POA: Diagnosis not present

## 2018-11-17 DIAGNOSIS — I69398 Other sequelae of cerebral infarction: Secondary | ICD-10-CM | POA: Diagnosis not present

## 2018-11-17 DIAGNOSIS — I69392 Facial weakness following cerebral infarction: Secondary | ICD-10-CM | POA: Diagnosis not present

## 2018-11-17 DIAGNOSIS — I129 Hypertensive chronic kidney disease with stage 1 through stage 4 chronic kidney disease, or unspecified chronic kidney disease: Secondary | ICD-10-CM | POA: Diagnosis not present

## 2018-11-17 DIAGNOSIS — I350 Nonrheumatic aortic (valve) stenosis: Secondary | ICD-10-CM | POA: Diagnosis not present

## 2018-11-17 DIAGNOSIS — R296 Repeated falls: Secondary | ICD-10-CM | POA: Diagnosis not present

## 2018-11-19 DIAGNOSIS — N183 Chronic kidney disease, stage 3 (moderate): Secondary | ICD-10-CM | POA: Diagnosis not present

## 2018-11-19 DIAGNOSIS — R296 Repeated falls: Secondary | ICD-10-CM | POA: Diagnosis not present

## 2018-11-19 DIAGNOSIS — I69392 Facial weakness following cerebral infarction: Secondary | ICD-10-CM | POA: Diagnosis not present

## 2018-11-19 DIAGNOSIS — E785 Hyperlipidemia, unspecified: Secondary | ICD-10-CM | POA: Diagnosis not present

## 2018-11-19 DIAGNOSIS — I129 Hypertensive chronic kidney disease with stage 1 through stage 4 chronic kidney disease, or unspecified chronic kidney disease: Secondary | ICD-10-CM | POA: Diagnosis not present

## 2018-11-19 DIAGNOSIS — I350 Nonrheumatic aortic (valve) stenosis: Secondary | ICD-10-CM | POA: Diagnosis not present

## 2018-11-19 DIAGNOSIS — I69322 Dysarthria following cerebral infarction: Secondary | ICD-10-CM | POA: Diagnosis not present

## 2018-11-19 DIAGNOSIS — I69398 Other sequelae of cerebral infarction: Secondary | ICD-10-CM | POA: Diagnosis not present

## 2018-11-21 ENCOUNTER — Other Ambulatory Visit: Payer: Self-pay

## 2018-11-21 ENCOUNTER — Ambulatory Visit (INDEPENDENT_AMBULATORY_CARE_PROVIDER_SITE_OTHER): Payer: Medicare Other

## 2018-11-21 DIAGNOSIS — N183 Chronic kidney disease, stage 3 (moderate): Secondary | ICD-10-CM

## 2018-11-21 DIAGNOSIS — R296 Repeated falls: Secondary | ICD-10-CM | POA: Diagnosis not present

## 2018-11-21 DIAGNOSIS — I69398 Other sequelae of cerebral infarction: Secondary | ICD-10-CM

## 2018-11-21 DIAGNOSIS — E785 Hyperlipidemia, unspecified: Secondary | ICD-10-CM

## 2018-11-21 DIAGNOSIS — I69392 Facial weakness following cerebral infarction: Secondary | ICD-10-CM | POA: Diagnosis not present

## 2018-11-21 DIAGNOSIS — I129 Hypertensive chronic kidney disease with stage 1 through stage 4 chronic kidney disease, or unspecified chronic kidney disease: Secondary | ICD-10-CM | POA: Diagnosis not present

## 2018-11-21 DIAGNOSIS — I69322 Dysarthria following cerebral infarction: Secondary | ICD-10-CM | POA: Diagnosis not present

## 2018-11-21 DIAGNOSIS — I350 Nonrheumatic aortic (valve) stenosis: Secondary | ICD-10-CM

## 2018-11-22 DIAGNOSIS — N183 Chronic kidney disease, stage 3 (moderate): Secondary | ICD-10-CM | POA: Diagnosis not present

## 2018-11-22 DIAGNOSIS — I69392 Facial weakness following cerebral infarction: Secondary | ICD-10-CM | POA: Diagnosis not present

## 2018-11-22 DIAGNOSIS — E785 Hyperlipidemia, unspecified: Secondary | ICD-10-CM | POA: Diagnosis not present

## 2018-11-22 DIAGNOSIS — I129 Hypertensive chronic kidney disease with stage 1 through stage 4 chronic kidney disease, or unspecified chronic kidney disease: Secondary | ICD-10-CM | POA: Diagnosis not present

## 2018-11-22 DIAGNOSIS — I350 Nonrheumatic aortic (valve) stenosis: Secondary | ICD-10-CM | POA: Diagnosis not present

## 2018-11-22 DIAGNOSIS — I69398 Other sequelae of cerebral infarction: Secondary | ICD-10-CM | POA: Diagnosis not present

## 2018-11-22 DIAGNOSIS — I69322 Dysarthria following cerebral infarction: Secondary | ICD-10-CM | POA: Diagnosis not present

## 2018-11-22 DIAGNOSIS — R296 Repeated falls: Secondary | ICD-10-CM | POA: Diagnosis not present

## 2018-11-23 ENCOUNTER — Other Ambulatory Visit: Payer: Self-pay

## 2018-11-24 DIAGNOSIS — I69322 Dysarthria following cerebral infarction: Secondary | ICD-10-CM | POA: Diagnosis not present

## 2018-11-24 DIAGNOSIS — I69392 Facial weakness following cerebral infarction: Secondary | ICD-10-CM | POA: Diagnosis not present

## 2018-11-24 DIAGNOSIS — I69398 Other sequelae of cerebral infarction: Secondary | ICD-10-CM | POA: Diagnosis not present

## 2018-11-24 DIAGNOSIS — I350 Nonrheumatic aortic (valve) stenosis: Secondary | ICD-10-CM | POA: Diagnosis not present

## 2018-11-24 DIAGNOSIS — E785 Hyperlipidemia, unspecified: Secondary | ICD-10-CM | POA: Diagnosis not present

## 2018-11-24 DIAGNOSIS — I129 Hypertensive chronic kidney disease with stage 1 through stage 4 chronic kidney disease, or unspecified chronic kidney disease: Secondary | ICD-10-CM | POA: Diagnosis not present

## 2018-11-24 DIAGNOSIS — N183 Chronic kidney disease, stage 3 (moderate): Secondary | ICD-10-CM | POA: Diagnosis not present

## 2018-11-24 DIAGNOSIS — R296 Repeated falls: Secondary | ICD-10-CM | POA: Diagnosis not present

## 2018-11-25 ENCOUNTER — Ambulatory Visit (INDEPENDENT_AMBULATORY_CARE_PROVIDER_SITE_OTHER): Payer: Medicare Other | Admitting: Family Medicine

## 2018-11-25 ENCOUNTER — Encounter: Payer: Self-pay | Admitting: Family Medicine

## 2018-11-25 ENCOUNTER — Other Ambulatory Visit: Payer: Self-pay

## 2018-11-25 VITALS — BP 142/67 | HR 73 | Temp 97.8°F | Ht 61.0 in | Wt 141.0 lb

## 2018-11-25 DIAGNOSIS — Z09 Encounter for follow-up examination after completed treatment for conditions other than malignant neoplasm: Secondary | ICD-10-CM

## 2018-11-25 DIAGNOSIS — R531 Weakness: Secondary | ICD-10-CM | POA: Diagnosis not present

## 2018-11-25 DIAGNOSIS — I6930 Unspecified sequelae of cerebral infarction: Secondary | ICD-10-CM

## 2018-11-25 DIAGNOSIS — I639 Cerebral infarction, unspecified: Secondary | ICD-10-CM | POA: Diagnosis not present

## 2018-11-25 DIAGNOSIS — R32 Unspecified urinary incontinence: Secondary | ICD-10-CM

## 2018-11-25 NOTE — Progress Notes (Signed)
Subjective: CC: hospital follow up PCP: [name], DO Toni Gomez is a 85 y.o. female presenting to clinic today for:  1. Hospital follow up for CVA Patient was admitted to the hospital on 10/29/2018 for recurrent stroke.  This was identified after she had facial droop.  She was placed on a 30-day event monitor and has this in place.  She is currently working with home health and notes that there last day will be Monday.  She has follow-up with neurology but asks if she actually needs to go.  Reports compliance with Lipitor, Norvasc and aspirin.  No recurrent strokelike symptoms.  No GI bleed.  No heart palpitations.  Overall doing well except she feels weak.  She notes that she does use her walker occasionally but often will sit in her chair.  She reports lower extremity edema.  She has urinary incontinence and wonders how she can help with this.  No previous history of vaginal birth or pelvic surgeries.  She lives alone.  Her nephew and his spouse come in up to 3 times daily to check in on her.  She also has a next-door neighbor that checks in on her.   ROS: Per HPI  No Known Allergies Past Medical History:  Diagnosis Date  . Elevated troponin 03/26/2018  . Hyperlipidemia   . Hypertension   . Renal disorder    chronic kidney disease  . Rhabdomyolysis   . Stroke [facility])     Current Outpatient Medications:  .  acetaminophen (TYLENOL) 650 MG CR tablet, Take 650 mg by mouth every 8 (eight) hours as needed for pain., Disp: , Rfl:  .  amLODipine (NORVASC) 5 MG tablet, Take 1 tablet (5 mg total) by mouth daily., Disp: 90 tablet, Rfl: 0 .  aspirin EC 325 MG tablet, Take 1 tablet (325 mg total) by mouth daily., Disp: 100 tablet, Rfl: 3 .  atorvastatin (LIPITOR) 40 MG tablet, Take 1 tablet (40 mg total) by mouth daily at 6 PM., Disp: 30 tablet, Rfl: 0 Social History   Socioeconomic History  . Marital status: Widowed    Spouse name: Not on file  . Number of children: 0  .  Years of education: Not on file  . Highest education level: 7th grade  Occupational History  . Occupation: retired  Scientific laboratory technician  . Financial resource strain: Not hard at all  . Food insecurity    Worry: Never true    Inability: Never true  . Transportation needs    Medical: No    Non-medical: No  Tobacco Use  . Smoking status: Never Smoker  . Smokeless tobacco: Never Used  Substance and Sexual Activity  . Alcohol use: No  . Drug use: No  . Sexual activity: Not Currently  Lifestyle  . Physical activity    Days per week: 3 days    Minutes per session: 30 min  . Stress: Not at all  Relationships  . Social connections    Talks on phone: More than three times a week    Gets together: More than three times a week    Attends religious service: More than 4 times per year    Active member of club or organization: No    Attends meetings of clubs or organizations: Never    Relationship status: Widowed  . Intimate partner violence    Fear of current or ex partner: No    Emotionally abused: No    Physically abused: No  Forced sexual activity: No  Other Topics Concern  . Not on file  Social History Narrative  . Not on file   Family History  Problem Relation Age of Onset  . Hypertension Other   . Asthma Father     Objective: Office vital signs reviewed. BP (!) 142/67   Pulse 73   Temp 97.8 F (36.6 C) (Oral)   Ht 5\' 1"  (1.549 m)   Wt 141 lb (64 kg)   BMI 26.64 kg/m   Physical Examination:  General: Awake, alert, well nourished, No acute distress HEENT: Normal    Eyes: PERRLA, extraocular membranes intact, sclera white Cardio: regular rate and rhythm, S1S2 heard, no murmurs appreciated; heart monitor in place Pulm: clear to auscultation bilaterally, no wheezes, rhonchi or rales; normal work of breathing on room air Extremities: warm, well perfused, 1+ pitting edema to ankles. No cyanosis or clubbing; +2 pulses bilaterally MSK: arrives in wheelchair; 5/5 UE  strength. 4/5 LE strength Skin: dry; intact; no rashes or lesions Neuro:  AOx3. Follows commands. Somewhat hard of hearing.  Assessment/ Plan: 85 y.o. female   1. Recurrent strokes (HCC) Uncertain etiology.  No arrhythmia appreciated on exam today.  She is being followed by cardiology and neurology.  I did recommend that she keep appointment with neurology for follow-up.  Continue aspirin, Lipitor and Norvasc.  She has not taken her blood pressure medicine today and therefore did not increase her Norvasc dose. - Ambulatory referral to Physical Therapy - Basic Metabolic Panel - CBC  2. Hospital discharge follow-up I reviewed her hospital discharge summary.  Awaiting records from home health.  Referral to physical therapy for ongoing strengthening of lower extremity. - Ambulatory referral to Physical Therapy - Basic Metabolic Panel - CBC  3. Weakness - Ambulatory referral to Physical Therapy - Basic Metabolic Panel - CBC  4. Urinary incontinence in female [place] exercises recommended.  Hesitate to add something for bladder at this point given borderline blood pressures in office.  She will follow-up PRN   No orders of the defined types were placed in this encounter.  No orders of the defined types were placed in this encounter.    [name], DO [place] [phone]

## 2018-11-25 NOTE — Patient Instructions (Addendum)
I referred you to Physical therapy next door.  Have your Home Health send me records.  According to your discharge summary from the hospital, you should be following up with the HEART doctor and the BRAIN doctor.  Please keep those appointments  Remember, you can take your Amlodipine for blood pressure without eating.  Make sure you take this medication before you come to your appointments.  Below are exercises to help with your bladder.  Kegel Exercises  Kegel exercises can help strengthen your pelvic floor muscles. The pelvic floor is a group of muscles that support your rectum, small intestine, and bladder. In females, pelvic floor muscles also help support the womb (uterus). These muscles help you control the flow of urine and stool. Kegel exercises are painless and simple, and they do not require any equipment. Your provider may suggest Kegel exercises to:  Improve bladder and bowel control.  Improve sexual response.  Improve weak pelvic floor muscles after surgery to remove the uterus (hysterectomy) or pregnancy (females).  Improve weak pelvic floor muscles after prostate gland removal or surgery (males). Kegel exercises involve squeezing your pelvic floor muscles, which are the same muscles you squeeze when you try to stop the flow of urine or keep from passing gas. The exercises can be done while sitting, standing, or lying down, but it is best to vary your position. Exercises How to do Kegel exercises: 1. Squeeze your pelvic floor muscles tight. You should feel a tight lift in your rectal area. If you are a female, you should also feel a tightness in your vaginal area. Keep your stomach, buttocks, and legs relaxed. 2. Hold the muscles tight for up to 10 seconds. 3. Breathe normally. 4. Relax your muscles. 5. Repeat as told by your health care provider. Repeat this exercise daily as told by your health care provider. Continue to do this exercise for at least 4-6 weeks, or for as  long as told by your health care provider. You may be referred to a physical therapist who can help you learn more about how to do Kegel exercises. Depending on your condition, your health care provider may recommend:  Varying how long you squeeze your muscles.  Doing several sets of exercises every day.  Doing exercises for several weeks.  Making Kegel exercises a part of your regular exercise routine. This information is not intended to replace advice given to you by your health care provider. Make sure you discuss any questions you have with your health care provider. Document Released: 04/20/2012 Document Revised: 12/22/2017 Document Reviewed: 12/22/2017 Elsevier Patient Education  2020 [employer].

## 2018-11-26 LAB — CBC
Hematocrit: 38.4 % (ref 34.0–46.6)
Hemoglobin: 12.7 g/dL (ref 11.1–15.9)
MCH: 27.1 pg (ref 26.6–33.0)
MCHC: 33.1 g/dL (ref 31.5–35.7)
MCV: 82 fL (ref 79–97)
Platelets: 288 10*3/uL (ref 150–450)
RBC: 4.69 x10E6/uL (ref 3.77–5.28)
RDW: 14.7 % (ref 11.7–15.4)
WBC: 4.2 10*3/uL (ref 3.4–10.8)

## 2018-11-26 LAB — BASIC METABOLIC PANEL
BUN/Creatinine Ratio: 14 (ref 12–28)
BUN: 20 mg/dL (ref 10–36)
CO2: 22 mmol/L (ref 20–29)
Calcium: 10.8 mg/dL — ABNORMAL HIGH (ref 8.7–10.3)
Chloride: 106 mmol/L (ref 96–106)
Creatinine, Ser: 1.46 mg/dL — ABNORMAL HIGH (ref 0.57–1.00)
GFR calc Af Amer: 36 mL/min/{1.73_m2} — ABNORMAL LOW (ref >59)
GFR calc non Af Amer: 31 mL/min/{1.73_m2} — ABNORMAL LOW (ref >59)
Glucose: 81 mg/dL (ref 65–99)
Potassium: 4.5 mmol/L (ref 3.5–5.2)
Sodium: 143 mmol/L (ref 134–144)

## 2018-11-28 DIAGNOSIS — I69392 Facial weakness following cerebral infarction: Secondary | ICD-10-CM | POA: Diagnosis not present

## 2018-11-28 DIAGNOSIS — N183 Chronic kidney disease, stage 3 (moderate): Secondary | ICD-10-CM | POA: Diagnosis not present

## 2018-11-28 DIAGNOSIS — I69398 Other sequelae of cerebral infarction: Secondary | ICD-10-CM | POA: Diagnosis not present

## 2018-11-28 DIAGNOSIS — R296 Repeated falls: Secondary | ICD-10-CM | POA: Diagnosis not present

## 2018-11-28 DIAGNOSIS — I350 Nonrheumatic aortic (valve) stenosis: Secondary | ICD-10-CM | POA: Diagnosis not present

## 2018-11-28 DIAGNOSIS — I69322 Dysarthria following cerebral infarction: Secondary | ICD-10-CM | POA: Diagnosis not present

## 2018-11-28 DIAGNOSIS — E785 Hyperlipidemia, unspecified: Secondary | ICD-10-CM | POA: Diagnosis not present

## 2018-11-28 DIAGNOSIS — I129 Hypertensive chronic kidney disease with stage 1 through stage 4 chronic kidney disease, or unspecified chronic kidney disease: Secondary | ICD-10-CM | POA: Diagnosis not present

## 2018-12-01 ENCOUNTER — Other Ambulatory Visit: Payer: Self-pay | Admitting: *Deleted

## 2018-12-01 DIAGNOSIS — R296 Repeated falls: Secondary | ICD-10-CM | POA: Diagnosis not present

## 2018-12-01 DIAGNOSIS — I129 Hypertensive chronic kidney disease with stage 1 through stage 4 chronic kidney disease, or unspecified chronic kidney disease: Secondary | ICD-10-CM | POA: Diagnosis not present

## 2018-12-01 DIAGNOSIS — R531 Weakness: Secondary | ICD-10-CM

## 2018-12-01 DIAGNOSIS — E785 Hyperlipidemia, unspecified: Secondary | ICD-10-CM | POA: Diagnosis not present

## 2018-12-01 DIAGNOSIS — I69398 Other sequelae of cerebral infarction: Secondary | ICD-10-CM | POA: Diagnosis not present

## 2018-12-01 DIAGNOSIS — Z8673 Personal history of transient ischemic attack (TIA), and cerebral infarction without residual deficits: Secondary | ICD-10-CM

## 2018-12-01 DIAGNOSIS — I69392 Facial weakness following cerebral infarction: Secondary | ICD-10-CM | POA: Diagnosis not present

## 2018-12-01 DIAGNOSIS — I350 Nonrheumatic aortic (valve) stenosis: Secondary | ICD-10-CM | POA: Diagnosis not present

## 2018-12-01 DIAGNOSIS — N183 Chronic kidney disease, stage 3 (moderate): Secondary | ICD-10-CM | POA: Diagnosis not present

## 2018-12-01 DIAGNOSIS — I639 Cerebral infarction, unspecified: Secondary | ICD-10-CM

## 2018-12-01 DIAGNOSIS — I69322 Dysarthria following cerebral infarction: Secondary | ICD-10-CM | POA: Diagnosis not present

## 2018-12-09 ENCOUNTER — Other Ambulatory Visit: Payer: Self-pay | Admitting: Family Medicine

## 2018-12-09 MED ORDER — ATORVASTATIN CALCIUM 40 MG PO TABS: 40.0000 mg | ORAL_TABLET | Freq: Every day | ORAL | 1 refills | Status: DC

## 2018-12-09 NOTE — Telephone Encounter (Signed)
Done

## 2018-12-09 NOTE — Telephone Encounter (Signed)
What is the name of the medication? atorvastatin (LIPITOR) 40 MG tablet  Have you contacted your pharmacy to request a refill? yes  Which pharmacy would you like this sent to? [facility]   Patient notified that their request is being sent to the clinical staff for review and that they should receive a call once it is complete. If they do not receive a call within 24 hours they can check with their pharmacy or our office.

## 2018-12-13 ENCOUNTER — Inpatient Hospital Stay: Payer: Medicare Other | Admitting: Adult Health

## 2018-12-15 ENCOUNTER — Telehealth: Payer: Self-pay | Admitting: *Deleted

## 2018-12-15 ENCOUNTER — Other Ambulatory Visit: Payer: Self-pay | Admitting: Cardiology

## 2018-12-15 DIAGNOSIS — I4891 Unspecified atrial fibrillation: Secondary | ICD-10-CM

## 2018-12-15 DIAGNOSIS — I639 Cerebral infarction, unspecified: Secondary | ICD-10-CM

## 2018-12-15 NOTE — Telephone Encounter (Signed)
FYI VM from [facility] from [place] Pt is doing fine w/ walker, she is driving herself to [place] She is not being picked up for PT at this time

## 2019-01-05 DIAGNOSIS — B351 Tinea unguium: Secondary | ICD-10-CM | POA: Diagnosis not present

## 2019-01-05 DIAGNOSIS — M79676 Pain in unspecified toe(s): Secondary | ICD-10-CM | POA: Diagnosis not present

## 2019-01-05 DIAGNOSIS — L84 Corns and callosities: Secondary | ICD-10-CM | POA: Diagnosis not present

## 2019-01-05 DIAGNOSIS — I70203 Unspecified atherosclerosis of native arteries of extremities, bilateral legs: Secondary | ICD-10-CM | POA: Diagnosis not present

## 2019-01-10 IMAGING — DX DG CHEST 2V
2 series · 2 of 2 positions shown · non-contrast
Comparison: 03/26/2018 chest radiograph.

CLINICAL DATA: [AGE]/o  F; cough and congestion.

EXAM:
CHEST - 2 VIEW

[chest lat]
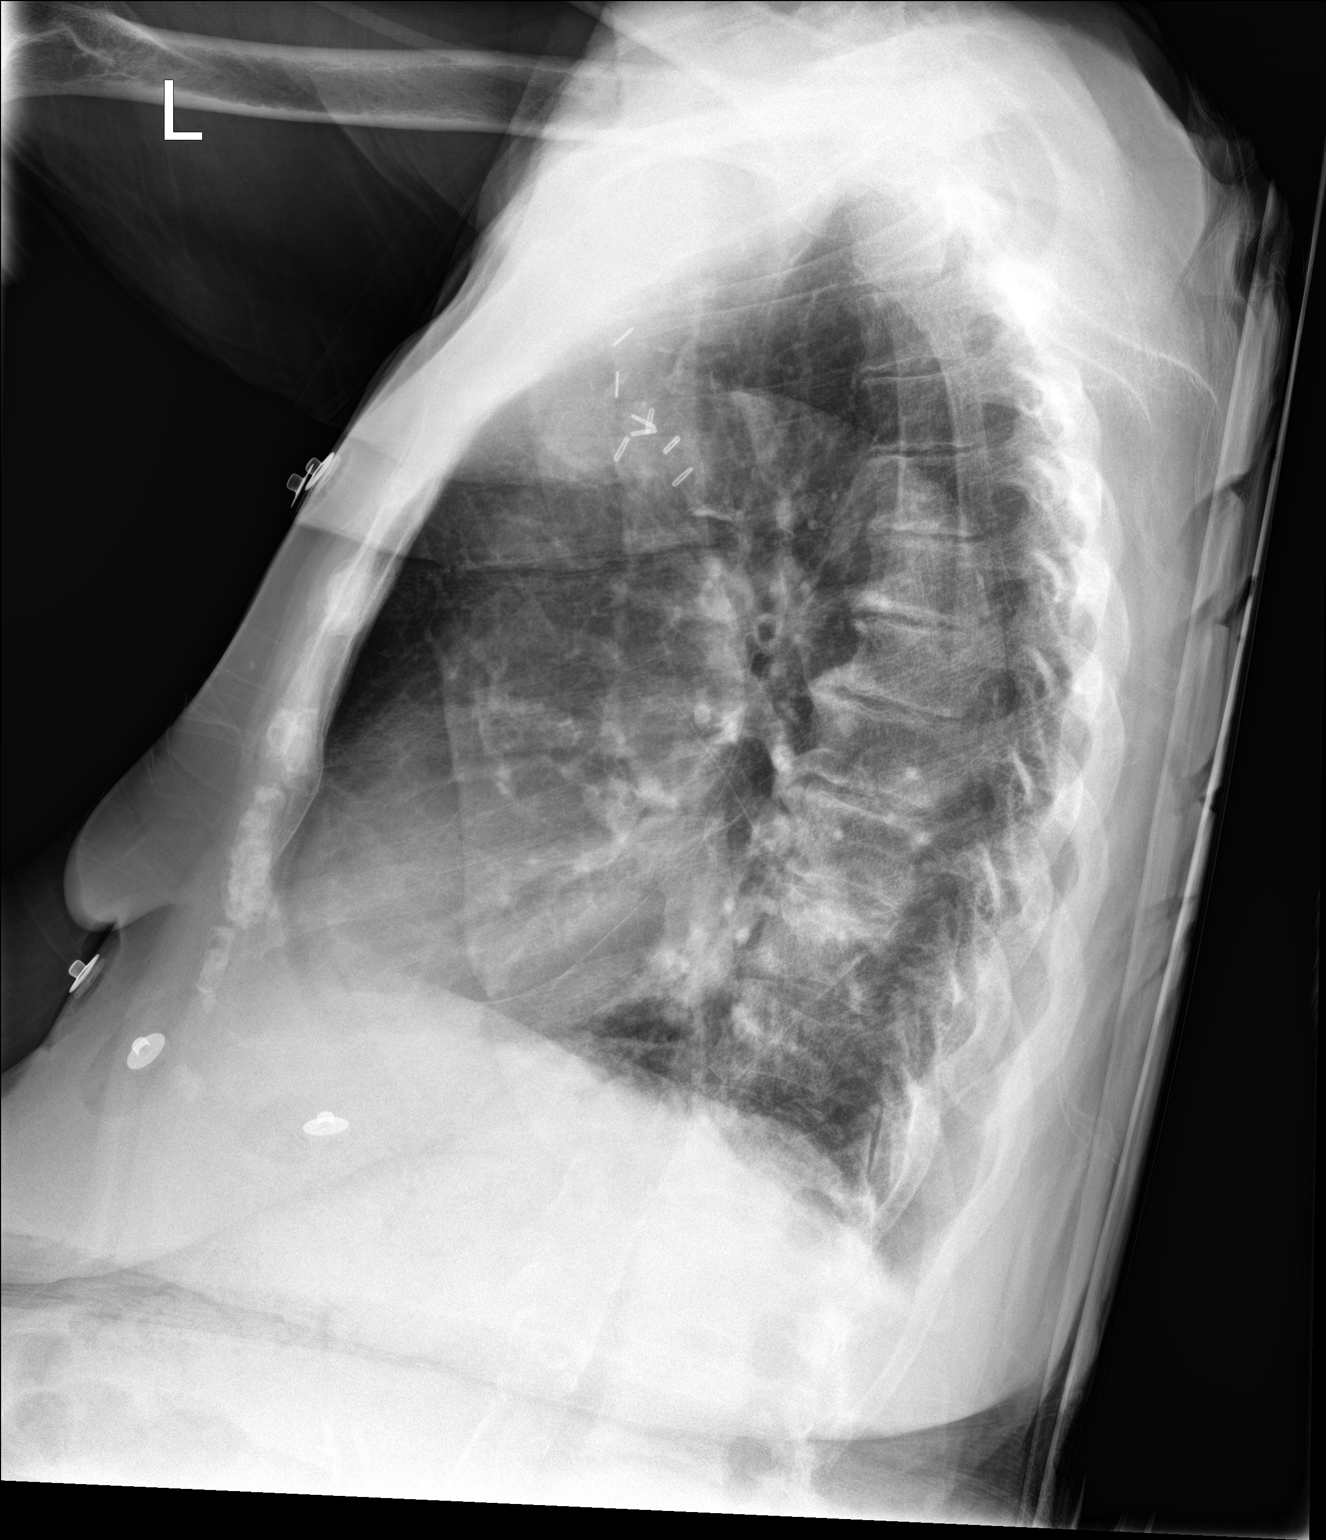

[chest ap]
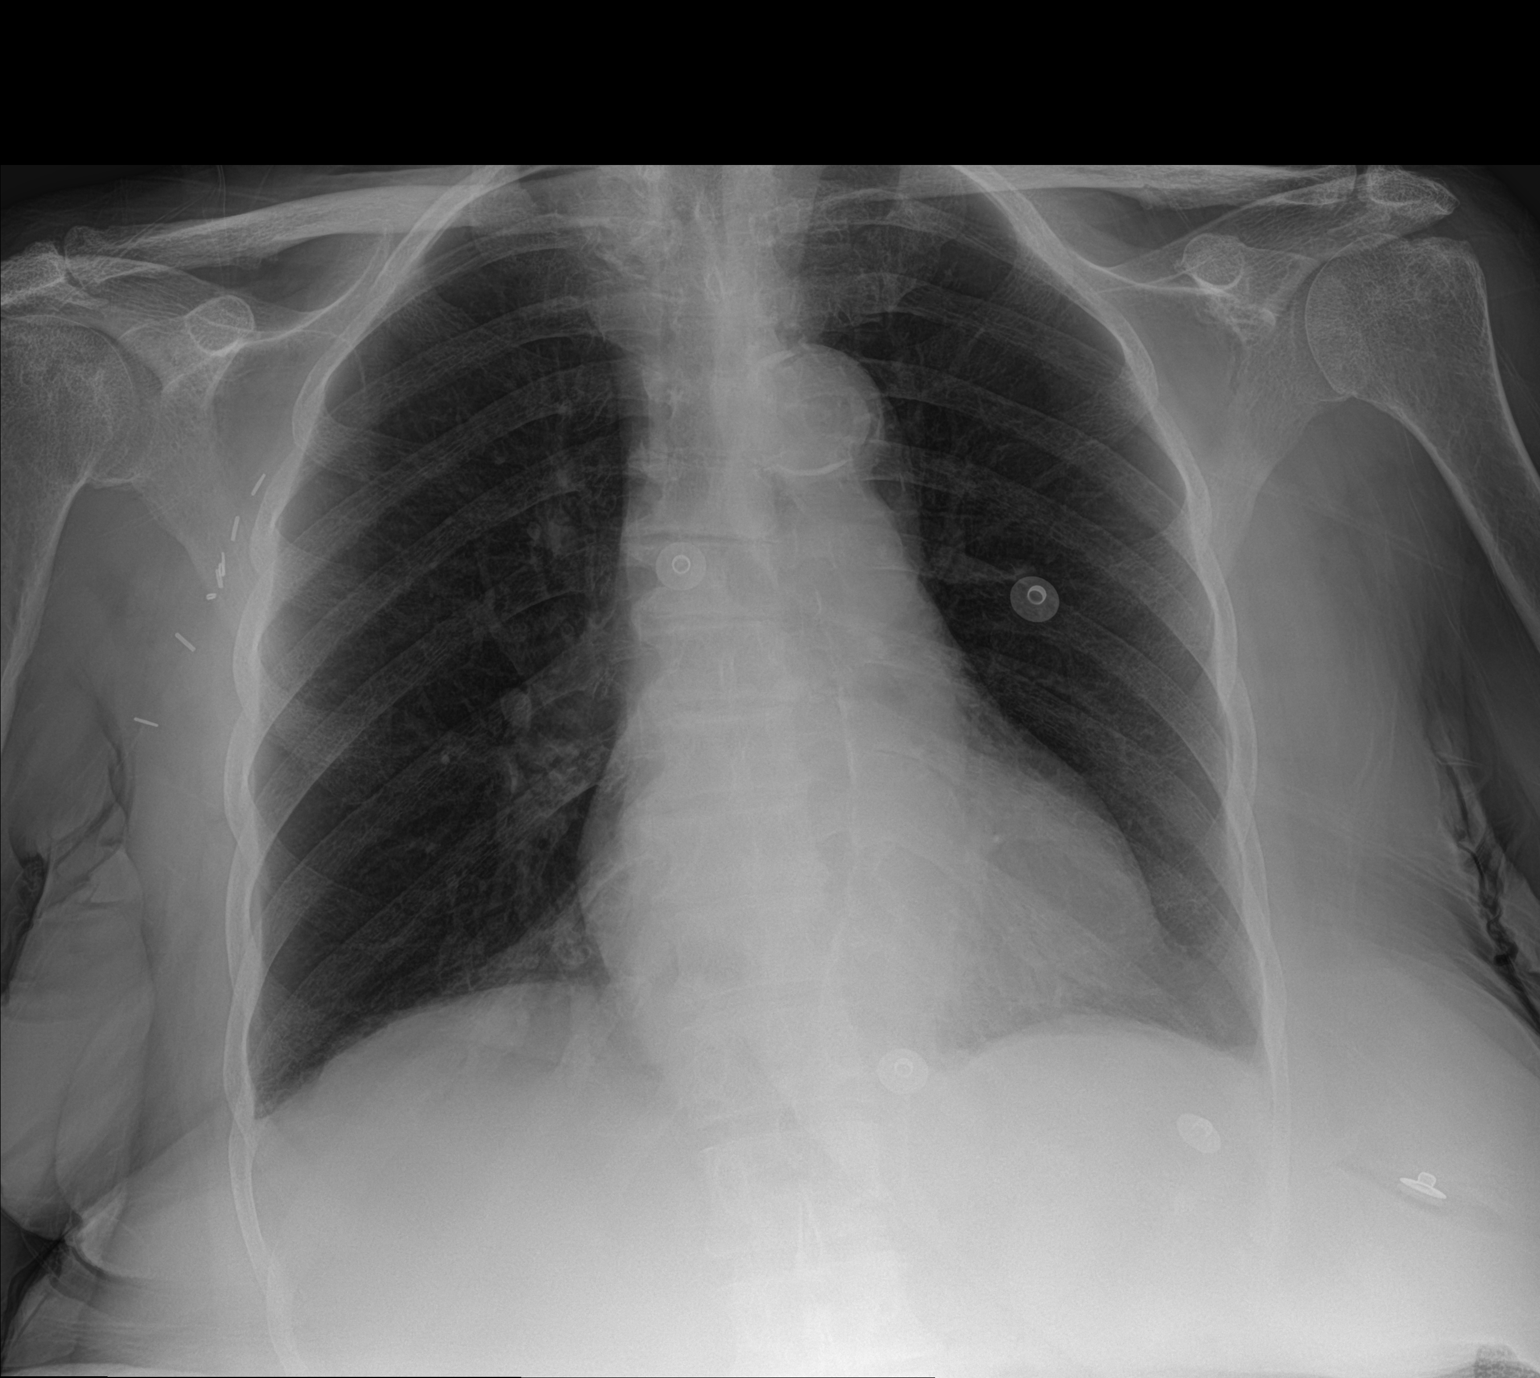

[2 of 2 positions shown; findings below may reference images not displayed]

FINDINGS: Normal cardiac silhouette. Aortic atherosclerosis with
calcification. Clear lungs. No pleural effusion or pneumothorax. No
acute osseous abnormality is evident. Surgical clips project over
the right axilla.
IMPRESSION: No acute pulmonary process identified.

## 2019-01-12 ENCOUNTER — Encounter: Payer: Self-pay | Admitting: Adult Health

## 2019-01-12 ENCOUNTER — Ambulatory Visit (INDEPENDENT_AMBULATORY_CARE_PROVIDER_SITE_OTHER): Payer: Medicare Other | Admitting: Adult Health

## 2019-01-12 ENCOUNTER — Other Ambulatory Visit: Payer: Self-pay

## 2019-01-12 VITALS — BP 179/75 | HR 61 | Temp 97.3°F | Ht 61.0 in | Wt 139.2 lb

## 2019-01-12 DIAGNOSIS — I63511 Cerebral infarction due to unspecified occlusion or stenosis of right middle cerebral artery: Secondary | ICD-10-CM | POA: Diagnosis not present

## 2019-01-12 DIAGNOSIS — R5383 Other fatigue: Secondary | ICD-10-CM | POA: Diagnosis not present

## 2019-01-12 DIAGNOSIS — E785 Hyperlipidemia, unspecified: Secondary | ICD-10-CM

## 2019-01-12 DIAGNOSIS — Z8673 Personal history of transient ischemic attack (TIA), and cerebral infarction without residual deficits: Secondary | ICD-10-CM

## 2019-01-12 DIAGNOSIS — I10 Essential (primary) hypertension: Secondary | ICD-10-CM | POA: Diagnosis not present

## 2019-01-12 DIAGNOSIS — I639 Cerebral infarction, unspecified: Secondary | ICD-10-CM

## 2019-01-12 DIAGNOSIS — R471 Dysarthria and anarthria: Secondary | ICD-10-CM

## 2019-01-12 NOTE — Patient Instructions (Signed)
Continue aspirin 81 mg daily  and Lipitor  for secondary stroke prevention  It will be very important for you to continue both these medications lifelong to help prevent additional strokes  Continue to follow up with PCP regarding cholesterol and blood pressure management   Increase activity during the day to prevent worsening weakness - ensure you have a family member with you during this to ensure safety  Continue to monitor blood pressure at home  Maintain strict control of hypertension with blood pressure goal below 130/90, diabetes with hemoglobin A1c goal below 6.5% and cholesterol with LDL cholesterol (bad cholesterol) goal below 70 mg/dL. I also advised the patient to eat a healthy diet with plenty of whole grains, cereals, fruits and vegetables, exercise regularly and maintain ideal body weight.  Followup in the future with me in 3 months or call earlier if needed       Thank you for coming to see [place] at [facility]. I hope we have been able to provide you high quality care today.  You may receive a patient satisfaction survey over the next few weeks. We would appreciate your feedback and comments so that we may continue to improve ourselves and the health of our patients.

## 2019-01-12 NOTE — Progress Notes (Signed)
[facility] [address] [place]. [place] [address] [phone]       HOSPITAL FOLLOW UP NOTE  [name] Date of Birth:  [birth year] Medical Record Number:  [id number]   Reason for Referral:  hospital stroke follow up    CHIEF COMPLAINT:  Chief Complaint  Patient presents with  . Hospitalization Follow-up    Close Freind ([name]) present. Treatment room. Patient mentioned that she feels weaker.     HPI: Toni Gomez being seen today for in office hospital follow-up regarding right MCA infarcts and punctate left frontal white matter infarcts on 10/29/2018.  History obtained from patient, family friend and chart review. Reviewed all radiology images and labs personally.  [name] is a 85 y.o. female with history of HTN, HLD, CKD, intermittent confusion and prior stroke  presented to [facility] ED on 10/29/2018 with left facial weakness. She did not receive IV t-PA due to mild deficits.  Neurology consulted with stroke work-up revealing right MCA cortical insular small strokes as well as punctate left frontal white matter infarct as evidenced on MRI likely related to right M1 and M2 moderate to high-grade stenosis as evidenced on MRA but cardioembolic source also possible with resultant mild left facial droop and mild dysarthria.  MRA also showed moderate arteriosclerotic changes elsewhere within the cranial circulation.  Carotid Doppler unremarkable and 2D echo normal EF.  Recommended 30-day cardiac event monitor to rule out atrial fibrillation.  Initiated aspirin 325 mg daily without recommended DAPT due to intolerance of Plavix.  HTN stable.  LDL 156 and initiated atorvastatin 40 mg daily.  No evidence of DM with A1c 5.4.  Other stroke risk factors include advanced age and prior history of stroke.  Discharged home in stable condition with recommendation of home health therapy.  [name] is being seen today for hospital follow-up and is accompanied by her  family friend, [name].  She has been stable since discharge with residual mild left facial weakness and mild dysarthria.  She does endorse daytime fatigue with lack of energy and generalized weakness feeling.  She was initially participating in therapy but this has since been completed.  She does live alone but has family check on her frequently throughout the day.  Typically ambulates with rolling walker but per family friend, she spends most of the day in her wheelchair limiting ambulation and activity.  She denies depression/anxiety symptoms.  She does endorse sleeping well at night.  She continues on aspirin 325 mg daily and atorvastatin 40 mg daily for secondary stroke prevention without side effects.  Blood pressure today elevated with patient endorsing not taking amlodipine 5 mg this morning as she did not eat breakfast prior to visit.  She does endorse amlodipine causing fatigue and lower extremity swelling.  She does follow closely with PCP.  30-day cardiac event monitor unremarkable for atrial fibrillation.  Denies new or worsening stroke/TIA symptoms.   ROS:   14 system review of systems performed and negative with exception of weakness, facial drooping and fatigue  PMH:  Past Medical History:  Diagnosis Date  . Elevated troponin 03/26/2018  . Hyperlipidemia   . Hypertension   . Renal disorder    chronic kidney disease  . Rhabdomyolysis   . Stroke [facility])     PSH:  Past Surgical History:  Procedure Laterality Date  . BREAST SURGERY  1980   R breast removed, not cancer    Social History:  Social History   Socioeconomic  History  . Marital status: Widowed    Spouse name: Not on file  . Number of children: 0  . Years of education: Not on file  . Highest education level: 7th grade  Occupational History  . Occupation: retired  Scientific laboratory technician  . Financial resource strain: Not hard at all  . Food insecurity    Worry: Never true    Inability: Never true  . Transportation needs     Medical: No    Non-medical: No  Tobacco Use  . Smoking status: Never Smoker  . Smokeless tobacco: Never Used  Substance and Sexual Activity  . Alcohol use: No  . Drug use: No  . Sexual activity: Not Currently  Lifestyle  . Physical activity    Days per week: 3 days    Minutes per session: 30 min  . Stress: Not at all  Relationships  . Social connections    Talks on phone: More than three times a week    Gets together: More than three times a week    Attends religious service: More than 4 times per year    Active member of club or organization: No    Attends meetings of clubs or organizations: Never    Relationship status: Widowed  . Intimate partner violence    Fear of current or ex partner: No    Emotionally abused: No    Physically abused: No    Forced sexual activity: No  Other Topics Concern  . Not on file  Social History Narrative  . Not on file    Family History:  Family History  Problem Relation Age of Onset  . Hypertension Other   . Asthma Father     Medications:   Current Outpatient Medications on File Prior to Visit  Medication Sig Dispense Refill  . amLODipine (NORVASC) 5 MG tablet Take 1 tablet (5 mg total) by mouth daily. 90 tablet 0  . aspirin EC 325 MG tablet Take 1 tablet (325 mg total) by mouth daily. 100 tablet 3  . atorvastatin (LIPITOR) 40 MG tablet Take 1 tablet (40 mg total) by mouth daily at 6 PM. 90 tablet 1  . acetaminophen (TYLENOL) 650 MG CR tablet Take 650 mg by mouth every 8 (eight) hours as needed for pain.     No current facility-administered medications on file prior to visit.     Allergies:  No Known Allergies   Physical Exam  Vitals:   01/12/19 0859  BP: (!) 179/75  Pulse: 61  Temp: (!) 97.3 F (36.3 C)  TempSrc: Oral  Weight: 139 lb 3.2 oz (63.1 kg)  Height: 5\' 1"  (1.549 m)   Body mass index is 26.3 kg/m. No exam data present  Depression screen [facility] 2/9 01/12/2019  Decreased Interest 0  Down, Depressed, Hopeless  0  PHQ - 2 Score 0  Altered sleeping -  Tired, decreased energy -  Change in appetite -  Feeling bad or failure about yourself  -  Trouble concentrating -  Moving slowly or fidgety/restless -  Suicidal thoughts -  PHQ-9 Score -     General: well developed, well nourished, pleasant elderly female, seated, in no evident distress Head: head normocephalic and atraumatic.   Neck: supple with no carotid or supraclavicular bruits Cardiovascular: regular rate and rhythm, no murmurs Musculoskeletal: no deformity Skin:  no rash/petichiae Vascular:  Normal pulses all extremities   Neurologic Exam Mental Status: Awake and fully alert.  Mild dysarthria. Oriented to place and time.  Recent and remote memory intact. Attention span, concentration and fund of knowledge appropriate. Mood and affect appropriate.  Cranial Nerves: Fundoscopic exam reveals sharp disc margins. Pupils equal, briskly reactive to light. Extraocular movements full without nystagmus. Visual fields full to confrontation. Hearing intact. Facial sensation intact.  Mild left lower facial weakness. Motor: Normal bulk and tone. Normal strength in all tested extremity muscles except for mild bilateral hip flexor weakness likely due to limited activity and deconditioning. Sensory.: intact to touch , pinprick , position and vibratory sensation.  Coordination: Rapid alternating movements normal in all extremities. Finger-to-nose performed accurately and heel-to-shin difficulty performing due to knee pain. Gait and Station: Gait assessment deferred as of rolling walker not present at visit.  Patient currently sitting in wheelchair Reflexes: 1+ and symmetric. Toes downgoing.     NIHSS  2 Modified Rankin  2    Diagnostic Data (Labs, Imaging, Testing)  Mr Brain Wo Contrast 10/29/2018 IMPRESSION:  1. Acute nonhemorrhagic cortical infarcts involving the right precentral gyrus compatible with left-sided facial or extremity symptoms.  2.  Acute nonhemorrhagic cortical infarct involving the anterior right insular cortex.  3. Acute punctate nonhemorrhagic subcortical white matter infarct in the left frontal lobe.  4. Moderate atrophy and periventricular white matter changes otherwise likely reflect the sequela of chronic microvascular ischemia.    Mr Jodene Nam Head Wo Contrast 10/30/2018 IMPRESSION:  1. Negative intracranial MRA for large vessel occlusion.  2. Moderate smooth long segment stenosis involving the mid-distal right M1 segment.  3. Moderate atherosclerotic change elsewhere within the intracranial circulation. No proximal high-grade or correctable stenosis.   ECHOCARDIOGRAM 10/30/2018 IMPRESSIONS  1. The left ventricle has normal systolic function with an ejection fraction of 60-65%. The cavity size was normal. There is moderately increased left ventricular wall thickness. Left ventricular diastolic Doppler parameters are consistent with impaired  relaxation.  2. The right ventricle has normal systolic function. The cavity was normal. There is no increase in right ventricular wall thickness.  3. The aortic valve has an indeterminate number of cusps. Severely thickening of the aortic valve. Severe calcifcation of the aortic valve. Aortic valve regurgitation is mild by color flow Doppler. Severe aortic annular calcification noted.  4. The mitral valve is abnormal. Mild thickening of the mitral valve leaflet. Mild calcification of the mitral valve leaflet. There is mild mitral annular calcification present. No evidence of mitral valve stenosis.  5. Morpholoigcally the aortic valve looks like severe stenosis. By calculated AVA VTI and dimensionless index stenosis is moderate, however there are overall low gradients. She has a low stroke volume index. Findings overall support paradoxical low flow  low gradient moderate aortic stenosis.  6. The aortic root is normal in size and structure.    ASSESSMENT: LINDSI RUDEL is a 85  y.o. year old female presented with left facial weakness on 10/29/2018 with stroke work-up revealing right MCA cortical and insular small strokes as well as punctate left frontal white matter infarct most likely secondary to right M1 and M2 moderate to high-grade stenosis but cardioembolic source cannot be ruled out.  Recommended 30-day cardiac event monitor outpatient to rule out atrial fibrillation.  Vascular risk factors include HTN, HLD and prior strokes.  Residual deficits left lower facial weakness with mild dysarthria with complaints of daytime fatigue and decreased energy level limiting daytime activity/exercise    PLAN:  1. Recent stroke: Continue aspirin 325 mg daily  and atorvastatin for secondary stroke prevention. Maintain strict control of hypertension with blood pressure goal  below 130/90, diabetes with hemoglobin A1c goal below 6.5% and cholesterol with LDL cholesterol (bad cholesterol) goal below 70 mg/dL.  I also advised the patient to eat a healthy diet with plenty of whole grains, cereals, fruits and vegetables, exercise regularly with at least 30 minutes of continuous activity daily and maintain ideal body weight.  Highly encouraged increasing activity and exercising with family member assistance to ensure safety 2. Fatigue/decreased energy: Likely multifactorial secondary to recent stroke, potential antihypertensive side effect and deconditioning.  Highly encouraging activity level and exercising daily with assistance to prevent worsening.  Denies depression/anxiety but would recommend ongoing monitoring 3. Left facial weakness/dysarthria: Very mild residual deficit and recommended continuation of exercises as recommended by therapy. 4. HTN: Advised to continue current treatment regimen.  Today's BP elevated.  Recommended to follow-up with PCP in regards to patient's amlodipine concerns regarding swelling and fatigue.  Recommended obtaining home blood pressure monitor with BP log and to  follow-up with PCP if remains elevated 5. HLD: Advised to continue current treatment regimen along with continued follow-up with PCP for future prescribing and monitoring of lipid panel    Follow up in 3 months or call earlier if needed   Greater than 50% of time during this 45 minute visit was spent on counseling, explanation of diagnosis of right MCA infarct, reviewing risk factor management of HTN and HLD, discussion regarding importance of patient compliance, planning of further management along with potential future management, and discussion with patient and family answering all questions.    Frann Rider, AGNP-BC  [facility] [address] [place], [place] [address]  Phone [phone] Fax [phone] Note: This document was prepared with digital dictation and possible smart phrase technology. Any transcriptional errors that result from this process are unintentional.

## 2019-01-13 NOTE — Progress Notes (Signed)
I agree with the above plan 

## 2019-01-26 ENCOUNTER — Telehealth: Payer: Self-pay | Admitting: Family Medicine

## 2019-01-26 NOTE — Telephone Encounter (Signed)
Left message to call back  

## 2019-01-26 NOTE — Telephone Encounter (Signed)
Dr. Lajuana Ripple does not have any appointments available.  Appointment scheduled tomorrow with Dr. Warrick Parisian.

## 2019-01-27 ENCOUNTER — Encounter: Payer: Self-pay | Admitting: Family Medicine

## 2019-01-27 ENCOUNTER — Other Ambulatory Visit: Payer: Self-pay

## 2019-01-27 ENCOUNTER — Ambulatory Visit (INDEPENDENT_AMBULATORY_CARE_PROVIDER_SITE_OTHER): Payer: Medicare Other | Admitting: Family Medicine

## 2019-01-27 VITALS — BP 175/78 | HR 57 | Temp 97.7°F

## 2019-01-27 DIAGNOSIS — I10 Essential (primary) hypertension: Secondary | ICD-10-CM | POA: Diagnosis not present

## 2019-01-27 DIAGNOSIS — R609 Edema, unspecified: Secondary | ICD-10-CM | POA: Diagnosis not present

## 2019-01-27 DIAGNOSIS — N3281 Overactive bladder: Secondary | ICD-10-CM | POA: Diagnosis not present

## 2019-01-27 DIAGNOSIS — N3941 Urge incontinence: Secondary | ICD-10-CM | POA: Diagnosis not present

## 2019-01-27 LAB — URINALYSIS, COMPLETE
Bilirubin, UA: NEGATIVE
Glucose, UA: NEGATIVE
Ketones, UA: NEGATIVE
Leukocytes,UA: NEGATIVE
Nitrite, UA: NEGATIVE
RBC, UA: NEGATIVE
Specific Gravity, UA: 1.02 (ref 1.005–1.030)
Urobilinogen, Ur: 0.2 mg/dL (ref 0.2–1.0)
pH, UA: 5.5 (ref 5.0–7.5)

## 2019-01-27 LAB — MICROSCOPIC EXAMINATION: Renal Epithel, UA: NONE SEEN /hpf

## 2019-01-27 MED ORDER — MIRABEGRON ER 25 MG PO TB24: 25.0000 mg | ORAL_TABLET | Freq: Every day | ORAL | 3 refills | Status: DC

## 2019-01-27 MED ORDER — AMLODIPINE BESYLATE 5 MG PO TABS: 7.5000 mg | ORAL_TABLET | Freq: Every day | ORAL | 0 refills | Status: DC

## 2019-01-27 NOTE — Progress Notes (Signed)
BP (!) 175/78   Pulse (!) 57   Temp 97.7 F (36.5 C) (Temporal)   SpO2 98%    Subjective:   Patient ID: [name], female    DOB: 1924/01/10, 85 y.o.   MRN: [id number]  HPI: LATIERA PATTI is a 85 y.o. female presenting on 01/27/2019 for Edema (bilateral legs and feet- ongoing) and Urinary Incontinence (x 1 month )   HPI Peripheral edema and hypertension Patient is coming in complaining of bilateral leg and feet swelling ongoing.  She says it is worse during the day when she is on her feet and then it will improve at night and it has been ongoing for as long as she can remember, she thinks it may have been more than a year.  She denies any pain with it but just the stretching and irritation from the swelling and stretching in her legs.  She denies any fevers or chills or redness or warmth or calf pain or muscular pain.  Urinary incontinence Patient is coming in complaining of urinary incontinence and frequency that has been bothering her more over the past year but has been bothering her before that as well.  She denies any burning or irritation or abdominal pain.  Relevant past medical, surgical, family and social history reviewed and updated as indicated. Interim medical history since our last visit reviewed. Allergies and medications reviewed and updated.  Review of Systems  Constitutional: Negative for chills and fever.  Eyes: Negative for visual disturbance.  Respiratory: Negative for chest tightness and shortness of breath.   Cardiovascular: Positive for leg swelling. Negative for chest pain.  Genitourinary: Positive for frequency. Negative for decreased urine volume and urgency.  Skin: Negative for rash.  Neurological: Negative for light-headedness and headaches.  Psychiatric/Behavioral: Negative for agitation and behavioral problems.  All other systems reviewed and are negative.   Per HPI unless specifically indicated above   Allergies as of 01/27/2019   No  Known Allergies     Medication List       Accurate as of January 27, 2019 12:15 PM. If you have any questions, ask your nurse or doctor.        acetaminophen 650 MG CR tablet Commonly known as: TYLENOL Take 650 mg by mouth every 8 (eight) hours as needed for pain.   amLODipine 5 MG tablet Commonly known as: NORVASC Take 1 tablet (5 mg total) by mouth daily.   aspirin EC 325 MG tablet Take 1 tablet (325 mg total) by mouth daily.   atorvastatin 40 MG tablet Commonly known as: LIPITOR Take 1 tablet (40 mg total) by mouth daily at 6 PM.        Objective:   BP (!) 175/78   Pulse (!) 57   Temp 97.7 F (36.5 C) (Temporal)   SpO2 98%   Wt Readings from Last 3 Encounters:  01/12/19 139 lb 3.2 oz (63.1 kg)  11/25/18 141 lb (64 kg)  10/29/18 142 lb 6.7 oz (64.6 kg)    Physical Exam Vitals signs and nursing note reviewed.  Constitutional:      General: She is not in acute distress.    Appearance: She is well-developed. She is not diaphoretic.  Eyes:     Conjunctiva/sclera: Conjunctivae normal.  Cardiovascular:     Rate and Rhythm: Normal rate and regular rhythm.     Heart sounds: Normal heart sounds. No murmur.  Pulmonary:     Effort: Pulmonary effort is normal. No respiratory distress.  Breath sounds: Normal breath sounds. No wheezing.  Abdominal:     General: Abdomen is flat. Bowel sounds are normal. There is no distension.     Palpations: Abdomen is soft.     Tenderness: There is no abdominal tenderness. There is no right CVA tenderness, left CVA tenderness, guarding or rebound.  Musculoskeletal: Normal range of motion.        General: Swelling (1+ pitting edema in bilateral lower extremity) present. No tenderness.  Skin:    General: Skin is warm and dry.     Findings: No rash.  Neurological:     Mental Status: She is alert and oriented to person, place, and time.     Coordination: Coordination normal.  Psychiatric:        Behavior: Behavior normal.      Urinalysis: 0-5 WBCs, 0-2 RBCs, 0-10 epithelial cells, few bacteria, mucus, 2+ protein, otherwise negative  Assessment & Plan:   Problem List Items Addressed This Visit      Cardiovascular and Mediastinum   Hypertension    Other Visit Diagnoses    Urgency incontinence    -  Primary   Relevant Orders   Urinalysis, Complete   Peripheral edema       OAB (overactive bladder)          Patient has urinary incontinence it sounds like overactive bladder and urgency and kind of a mixed incontinence, will try Myrbetriq with her  Increased amlodipine because of elevated blood pressures to 7.5 mg daily  Patient has peripheral edema and may consider switching from amlodipine to a different one if it persists but also may be due to her age and wheelchair-bound so we will give compression stockings and try that first. Follow up plan: Return in about 4 weeks (around 02/24/2019), or if symptoms worsen or fail to improve, for Hypertension recheck with PCP.  Counseling provided for all of the vaccine components Orders Placed This Encounter  Procedures  . Urinalysis, Complete    Caryl Pina, MD [facility] 01/27/2019, 12:15 PM

## 2019-01-31 ENCOUNTER — Telehealth: Payer: Self-pay

## 2019-01-31 NOTE — Telephone Encounter (Signed)
-----   Message from Rosalin Hawking, MD sent at 01/25/2019  1:12 PM EDT ----- Could you please let the patient know that the heart monitoring test done recently was negative for irregular heart rhythm called afib . Please continue current treatment. Thanks.  Rosalin Hawking, MD PhD Stroke Neurology 01/25/2019 1:11 PM

## 2019-01-31 NOTE — Telephone Encounter (Signed)
Notes recorded by Marval Regal, RN on 01/31/2019 at 10:10 AM EDT  Left vm for [name] pts dpr contact list. Need to give cardiac event monitor results.  ------

## 2019-02-07 NOTE — Telephone Encounter (Signed)
Tried to call pts listed number.Number was busy could not leave message.

## 2019-02-18 ENCOUNTER — Emergency Department (HOSPITAL_COMMUNITY)
Admission: EM | Admit: 2019-02-18 | Discharge: 2019-02-18 | Disposition: A | Discharge status: Home / Self Care | Payer: Medicare Other | Attending: Emergency Medicine | Admitting: Emergency Medicine

## 2019-02-18 ENCOUNTER — Encounter (HOSPITAL_COMMUNITY): Payer: Self-pay

## 2019-02-18 ENCOUNTER — Emergency Department (HOSPITAL_COMMUNITY): Payer: Medicare Other

## 2019-02-18 DIAGNOSIS — Y929 Unspecified place or not applicable: Secondary | ICD-10-CM | POA: Insufficient documentation

## 2019-02-18 DIAGNOSIS — S199XXA Unspecified injury of neck, initial encounter: Secondary | ICD-10-CM | POA: Diagnosis not present

## 2019-02-18 DIAGNOSIS — R58 Hemorrhage, not elsewhere classified: Secondary | ICD-10-CM | POA: Diagnosis not present

## 2019-02-18 DIAGNOSIS — Y999 Unspecified external cause status: Secondary | ICD-10-CM | POA: Insufficient documentation

## 2019-02-18 DIAGNOSIS — S0990XA Unspecified injury of head, initial encounter: Secondary | ICD-10-CM

## 2019-02-18 DIAGNOSIS — S0001XA Abrasion of scalp, initial encounter: Secondary | ICD-10-CM | POA: Diagnosis not present

## 2019-02-18 DIAGNOSIS — Y939 Activity, unspecified: Secondary | ICD-10-CM | POA: Diagnosis not present

## 2019-02-18 DIAGNOSIS — Z79899 Other long term (current) drug therapy: Secondary | ICD-10-CM | POA: Diagnosis not present

## 2019-02-18 DIAGNOSIS — I129 Hypertensive chronic kidney disease with stage 1 through stage 4 chronic kidney disease, or unspecified chronic kidney disease: Secondary | ICD-10-CM | POA: Diagnosis not present

## 2019-02-18 DIAGNOSIS — Z23 Encounter for immunization: Secondary | ICD-10-CM | POA: Diagnosis not present

## 2019-02-18 DIAGNOSIS — N183 Chronic kidney disease, stage 3 unspecified: Secondary | ICD-10-CM | POA: Diagnosis not present

## 2019-02-18 DIAGNOSIS — I10 Essential (primary) hypertension: Secondary | ICD-10-CM | POA: Diagnosis not present

## 2019-02-18 DIAGNOSIS — W0110XA Fall on same level from slipping, tripping and stumbling with subsequent striking against unspecified object, initial encounter: Secondary | ICD-10-CM | POA: Diagnosis not present

## 2019-02-18 DIAGNOSIS — W19XXXA Unspecified fall, initial encounter: Secondary | ICD-10-CM | POA: Diagnosis not present

## 2019-02-18 MED ORDER — TETANUS-DIPHTH-ACELL PERTUSSIS 5-2.5-18.5 LF-MCG/0.5 IM SUSP
0.5000 mL | Freq: Once | INTRAMUSCULAR | Status: AC
  Administered 2019-02-18: 15:00:00 0.5 mL via INTRAMUSCULAR
  Filled 2019-02-18: qty 0.5

## 2019-02-18 NOTE — ED Notes (Signed)
Patient transported to CT 

## 2019-02-18 NOTE — ED Triage Notes (Signed)
Pt was walking out of grocery store and her left leg gave out causing her to fall back and hit back of her head. No LOC. Denies pain, but noted to have dried blood to back of head. Pt is not on blood thinners

## 2019-02-18 NOTE — ED Provider Notes (Signed)
[facility] EMERGENCY DEPARTMENT Provider Note   CSN: [id number] Arrival date & time: 02/18/19  1331     History   Chief Complaint Chief Complaint  Patient presents with  . Fall  . Head Injury    HPI Toni Gomez is a 85 y.o. female.     Patient states that she slipped and fell and hit the back of her head no loss of consciousness.  She stated that her left leg gave out.  She has a history of a bad knee on that side  The history is provided by the patient. No language interpreter was used.  Fall This is a new problem. The current episode started less than 1 hour ago. The problem occurs rarely. The problem has not changed since onset.Pertinent negatives include no chest pain, no abdominal pain and no headaches. Nothing aggravates the symptoms. Nothing relieves the symptoms. She has tried nothing for the symptoms. The treatment provided no relief.    Past Medical History:  Diagnosis Date  . Elevated troponin 03/26/2018  . Hyperlipidemia   . Hypertension   . Renal disorder    chronic kidney disease  . Rhabdomyolysis   . Stroke [facility])     Patient Active Problem List   Diagnosis Date Noted  . Recurrent strokes ([place]) 10/29/2018  . Hypercalcemia 10/29/2018  . Allergic conjunctivitis of both eyes 07/25/2018  . Hyperlipidemia 03/31/2018  . History of stroke 03/28/2018  . CKD (chronic kidney disease) stage 3, GFR 30-59 ml/min   . Fall   . Stroke (cerebrum) ([place])   . Abnormal EKG 03/27/2018  . Syncope and collapse 03/27/2018  . Acute encephalopathy 03/27/2018  . Altered mental status 03/26/2018  . Rhabdomyolysis 03/26/2018  . Hypertension 03/26/2018    Past Surgical History:  Procedure Laterality Date  . BREAST SURGERY  1980   R breast removed, not cancer     OB History   No obstetric history on file.      Home Medications    Prior to Admission medications   Medication Sig Start Date End Date Taking? Authorizing Provider  acetaminophen (TYLENOL) 650 MG  CR tablet Take 650 mg by mouth every 8 (eight) hours as needed for pain.    [provider]  amLODipine (NORVASC) 5 MG tablet Take 1.5 tablets (7.5 mg total) by mouth daily. 01/27/19   Dettinger, Fransisca Kaufmann, MD  aspirin EC 325 MG tablet Take 1 tablet (325 mg total) by mouth daily. 10/30/18 10/30/19  Florencia Reasons, MD  atorvastatin (LIPITOR) 40 MG tablet Take 1 tablet (40 mg total) by mouth daily at 6 PM. 12/09/18   Ronnie Doss M, DO  mirabegron ER (MYRBETRIQ) 25 MG TB24 tablet Take 1 tablet (25 mg total) by mouth daily. 01/27/19   Dettinger, Fransisca Kaufmann, MD    Family History Family History  Problem Relation Age of Onset  . Hypertension Other   . Asthma Father     Social History Social History   Tobacco Use  . Smoking status: Never Smoker  . Smokeless tobacco: Never Used  Substance Use Topics  . Alcohol use: No  . Drug use: No     Allergies   Patient has no known allergies.   Review of Systems Review of Systems  Constitutional: Negative for appetite change and fatigue.  HENT: Negative for congestion, ear discharge and sinus pressure.        Tenderness occipital head  Eyes: Negative for discharge.  Respiratory: Negative for cough.   Cardiovascular: Negative  for chest pain.  Gastrointestinal: Negative for abdominal pain and diarrhea.  Genitourinary: Negative for frequency and hematuria.  Musculoskeletal: Negative for back pain.  Skin: Negative for rash.  Neurological: Negative for seizures and headaches.  Psychiatric/Behavioral: Negative for hallucinations.     Physical Exam Updated Vital Signs BP (!) 170/61 (BP Location: Left Arm)   Pulse 73   Temp 97.7 F (36.5 C) (Oral)   Resp 16   SpO2 98%   Physical Exam Vitals signs reviewed.  Constitutional:      Appearance: She is well-developed.  HENT:     Head: Normocephalic.     Comments: Abrasion to occipital area    Nose: Nose normal.  Eyes:     General: No scleral icterus.    Conjunctiva/sclera:  Conjunctivae normal.  Neck:     Musculoskeletal: Neck supple.     Thyroid: No thyromegaly.  Cardiovascular:     Rate and Rhythm: Normal rate and regular rhythm.     Heart sounds: No murmur. No friction rub. No gallop.   Pulmonary:     Breath sounds: No stridor. No wheezing or rales.  Chest:     Chest wall: No tenderness.  Abdominal:     General: There is no distension.     Tenderness: There is no abdominal tenderness. There is no rebound.  Musculoskeletal: Normal range of motion.  Lymphadenopathy:     Cervical: No cervical adenopathy.  Skin:    Findings: No erythema or rash.  Neurological:     Mental Status: She is oriented to person, place, and time.     Motor: No abnormal muscle tone.     Coordination: Coordination normal.  Psychiatric:        Behavior: Behavior normal.      ED Treatments / Results  Labs (all labs ordered are listed, but only abnormal results are displayed) Labs Reviewed - No data to display  EKG None  Radiology No results found.  Procedures Procedures (including critical care time)  Medications Ordered in ED Medications  Tdap (BOOSTRIX) injection 0.5 mL (has no administration in time range)     Initial Impression / Assessment and Plan / ED Course  I have reviewed the triage vital signs and the nursing notes.  Pertinent labs & imaging results that were available during my care of the patient were reviewed by me and considered in my medical decision making (see chart for details).    CT scan negative.  Patient will follow-up with her primary care doctor as needed     Final Clinical Impressions(s) / ED Diagnoses   Final diagnoses:  None    ED Discharge Orders    None       Milton Ferguson, MD 02/18/19 1502

## 2019-02-18 NOTE — Discharge Instructions (Signed)
Take Tylenol for pain and follow-up with your family doctor this week for recheck

## 2019-02-28 ENCOUNTER — Other Ambulatory Visit: Payer: Self-pay

## 2019-03-01 ENCOUNTER — Ambulatory Visit (INDEPENDENT_AMBULATORY_CARE_PROVIDER_SITE_OTHER): Payer: Medicare Other | Admitting: Family Medicine

## 2019-03-01 DIAGNOSIS — Z9181 History of falling: Secondary | ICD-10-CM

## 2019-03-01 DIAGNOSIS — I10 Essential (primary) hypertension: Secondary | ICD-10-CM

## 2019-03-01 DIAGNOSIS — N3941 Urge incontinence: Secondary | ICD-10-CM | POA: Diagnosis not present

## 2019-03-01 DIAGNOSIS — H9193 Unspecified hearing loss, bilateral: Secondary | ICD-10-CM

## 2019-03-01 MED ORDER — AMLODIPINE BESYLATE 5 MG PO TABS: 7.5000 mg | ORAL_TABLET | Freq: Every day | ORAL | 1 refills | Status: DC

## 2019-03-01 NOTE — Progress Notes (Signed)
2 week nurse visit scheduled.

## 2019-03-01 NOTE — Progress Notes (Signed)
Telephone visit  Subjective: [id number] PCP: [name], DO [id number] C Booras is a 85 y.o. female calls for telephone consult today. Patient provides verbal consent for consult held via phone.  Location of patient: home Location of provider: [facility] Others present for call: nephew  1. Hypertension Patient is tolerating the increased dose of Norvasc 7.5mg .  She denies LE edema, CP, SOB, dizziness.  She had measured her blood pressures previously and both the systolics and diastolics were elevated.  She has not checked recently because she is not sure that her monitor is working appropriately.  2.  Incontinence Patient reports ongoing urinary incontinence that seemed worse with her Myrbetriq, so she has since discontinued the medicine.  She is continuing with adult diapers with changing at least 4 times daily.  3.  Fall Patient had a recent fall when her knee gave out from underneath her at the store.  She was seen in the emergency department and had an evaluation.  No acute bleeds noted.  She notes that her knees often want to give out from underneath her and therefore she wears a brace.  She was having physical therapy come to her home and she was compliant at that time but has since not continued the physical therapies as recommended.  She will start this again  4.  Hearing loss Patient reports longstanding history of hearing loss.  She would like to be seen by an audiologist to see if she would be a good candidate for hearing aids.  She notes bilateral hearing loss.   ROS: Per HPI  No Known Allergies Past Medical History:  Diagnosis Date  . Elevated troponin 03/26/2018  . Hyperlipidemia   . Hypertension   . Renal disorder    chronic kidney disease  . Rhabdomyolysis   . Stroke [facility])     Current Outpatient Medications:  .  acetaminophen (TYLENOL) 650 MG CR tablet, Take 650 mg by mouth every 8 (eight) hours as needed for pain., Disp: , Rfl:  .  amLODipine (NORVASC)  5 MG tablet, Take 1.5 tablets (7.5 mg total) by mouth daily., Disp: 45 tablet, Rfl: 0 .  aspirin EC 325 MG tablet, Take 1 tablet (325 mg total) by mouth daily., Disp: 100 tablet, Rfl: 3 .  atorvastatin (LIPITOR) 40 MG tablet, Take 1 tablet (40 mg total) by mouth daily at 6 PM., Disp: 90 tablet, Rfl: 1 .  mirabegron ER (MYRBETRIQ) 25 MG TB24 tablet, Take 1 tablet (25 mg total) by mouth daily., Disp: 30 tablet, Rfl: 3  Assessment/ Plan: 85 y.o. female   1. Bilateral hearing loss, unspecified hearing loss type Placed referral to audiology per her request. - Ambulatory referral to Audiology  2. At risk for falls I offered her physical therapy but she has physical therapy at home that she can work on.  She will start doing this every day and we will plan for referral if needed at a later date  3. Essential hypertension Has not been monitoring her blood pressures very recently but notes that her last ones were not at goal.  She is unsure if her blood pressure monitor is working correctly and therefore I have asked her to bring this into her nurse visit in 2 weeks to see if this is appropriately calibrated.  For now, continue increased dose of Norvasc.  May need to increase to 10 mg daily - amLODipine (NORVASC) 5 MG tablet; Take 1.5 tablets (7.5 mg total) by mouth daily.  Dispense: 45 tablet; Refill:  1  4. Urgency incontinence Refractory to multiple oral therapies.  I am prescribing her adult diapers and sending this to see if perhaps that her insurance will assist her with this.   Start time: 10:01 (tried at cell x2 but "please try call later" automated message), Tried at 10:03am home p# (no answer); 10:10am (cell number still not working); 10:17am ("call did not go through, try call later" message); 10:17 (called home p# again) End time: 10:29am  Total time spent on patient care (including telephone call/ virtual visit): 16 minutes  [place], [place] [phone]

## 2019-03-16 ENCOUNTER — Ambulatory Visit: Payer: Medicare Other

## 2019-03-30 DIAGNOSIS — L84 Corns and callosities: Secondary | ICD-10-CM | POA: Diagnosis not present

## 2019-03-30 DIAGNOSIS — B351 Tinea unguium: Secondary | ICD-10-CM | POA: Diagnosis not present

## 2019-03-30 DIAGNOSIS — M79676 Pain in unspecified toe(s): Secondary | ICD-10-CM | POA: Diagnosis not present

## 2019-03-30 DIAGNOSIS — I70203 Unspecified atherosclerosis of native arteries of extremities, bilateral legs: Secondary | ICD-10-CM | POA: Diagnosis not present

## 2019-04-20 ENCOUNTER — Ambulatory Visit: Payer: Medicare Other | Admitting: Adult Health

## 2019-04-27 ENCOUNTER — Other Ambulatory Visit: Payer: Self-pay | Admitting: Family Medicine

## 2019-04-27 DIAGNOSIS — I10 Essential (primary) hypertension: Secondary | ICD-10-CM

## 2019-04-28 ENCOUNTER — Other Ambulatory Visit: Payer: Self-pay | Admitting: Family Medicine

## 2019-04-28 DIAGNOSIS — I10 Essential (primary) hypertension: Secondary | ICD-10-CM

## 2019-05-26 ENCOUNTER — Other Ambulatory Visit: Payer: Self-pay | Admitting: Family Medicine

## 2019-05-26 DIAGNOSIS — I10 Essential (primary) hypertension: Secondary | ICD-10-CM

## 2019-05-26 NOTE — Telephone Encounter (Signed)
OV 06/27/19

## 2019-06-08 ENCOUNTER — Other Ambulatory Visit: Payer: Self-pay | Admitting: Family Medicine

## 2019-06-08 ENCOUNTER — Ambulatory Visit: Payer: Medicare Other | Admitting: Adult Health

## 2019-06-19 ENCOUNTER — Other Ambulatory Visit: Payer: Self-pay | Admitting: Family Medicine

## 2019-06-19 DIAGNOSIS — I10 Essential (primary) hypertension: Secondary | ICD-10-CM

## 2019-06-20 DIAGNOSIS — M79676 Pain in unspecified toe(s): Secondary | ICD-10-CM | POA: Diagnosis not present

## 2019-06-20 DIAGNOSIS — L84 Corns and callosities: Secondary | ICD-10-CM | POA: Diagnosis not present

## 2019-06-20 DIAGNOSIS — I70203 Unspecified atherosclerosis of native arteries of extremities, bilateral legs: Secondary | ICD-10-CM | POA: Diagnosis not present

## 2019-06-20 DIAGNOSIS — B351 Tinea unguium: Secondary | ICD-10-CM | POA: Diagnosis not present

## 2019-06-23 ENCOUNTER — Other Ambulatory Visit: Payer: Self-pay

## 2019-06-26 ENCOUNTER — Telehealth: Payer: Self-pay | Admitting: Family Medicine

## 2019-06-26 ENCOUNTER — Other Ambulatory Visit: Payer: Self-pay

## 2019-06-27 ENCOUNTER — Ambulatory Visit (INDEPENDENT_AMBULATORY_CARE_PROVIDER_SITE_OTHER): Payer: Medicare HMO | Admitting: Family Medicine

## 2019-06-27 ENCOUNTER — Encounter: Payer: Self-pay | Admitting: Family Medicine

## 2019-06-27 VITALS — BP 126/57 | HR 70 | Temp 98.4°F | Ht 60.0 in | Wt 131.0 lb

## 2019-06-27 DIAGNOSIS — Z8673 Personal history of transient ischemic attack (TIA), and cerebral infarction without residual deficits: Secondary | ICD-10-CM | POA: Diagnosis not present

## 2019-06-27 DIAGNOSIS — R634 Abnormal weight loss: Secondary | ICD-10-CM | POA: Diagnosis not present

## 2019-06-27 DIAGNOSIS — H6123 Impacted cerumen, bilateral: Secondary | ICD-10-CM | POA: Diagnosis not present

## 2019-06-27 DIAGNOSIS — N1832 Chronic kidney disease, stage 3b: Secondary | ICD-10-CM

## 2019-06-27 DIAGNOSIS — I10 Essential (primary) hypertension: Secondary | ICD-10-CM

## 2019-06-27 NOTE — Progress Notes (Signed)
Subjective: CC: Follow-up hypertension PCP: [name], DO [id number] Toni Gomez is a 86 y.o. female presenting to clinic today for:  1.  Hypertension/weight loss Patient reports compliance with low-salt diet.  She is really been trying to clean up her diet in efforts to control her blood pressure and reduce risk for recurrent stroke.  She is compliant with Norvasc 5 mg daily, Lipitor 40 mg daily and her daily aspirin.  No chest pain, shortness of breath, lower extremity edema or falls.  No abnormal bleeding, night sweats, decreased appetite or early satiety.  2.  Decreased hearing Patient reports bilateral decreased hearing with right greater than left.  She thinks that she may need an irrigation of her ears.  She has had to have these in the past.  Denies any ear pain.  She does note occasional muscle spasm of the neck.   ROS: Per HPI  No Known Allergies Past Medical History:  Diagnosis Date  . Elevated troponin 03/26/2018  . Hyperlipidemia   . Hypertension   . Renal disorder    chronic kidney disease  . Rhabdomyolysis   . Stroke [facility])     Current Outpatient Medications:  .  acetaminophen (TYLENOL) 650 MG CR tablet, Take 650 mg by mouth every 8 (eight) hours as needed for pain., Disp: , Rfl:  .  amLODipine (NORVASC) 5 MG tablet, TAKE 1 & 1/2 TABLETS BY MOUTH ONCE DAILY., Disp: 45 tablet, Rfl: 0 .  aspirin EC 325 MG tablet, Take 1 tablet (325 mg total) by mouth daily., Disp: 100 tablet, Rfl: 3 .  atorvastatin (LIPITOR) 40 MG tablet, TAKE 1 TABLET ONCE DAILY AT 6 PM., Disp: 90 tablet, Rfl: 0 Social History   Socioeconomic History  . Marital status: Widowed    Spouse name: Not on file  . Number of children: 0  . Years of education: Not on file  . Highest education level: 7th grade  Occupational History  . Occupation: retired  Tobacco Use  . Smoking status: Never Smoker  . Smokeless tobacco: Never Used  Substance and Sexual Activity  . Alcohol use: No  . Drug  use: No  . Sexual activity: Not Currently  Other Topics Concern  . Not on file  Social History Narrative  . Not on file   Social Determinants of Health   Financial Resource Strain: Low Risk   . Difficulty of Paying Living Expenses: Not hard at all  Food Insecurity: No Food Insecurity  . Worried About Charity fundraiser in the Last Year: Never true  . Ran Out of Food in the Last Year: Never true  Transportation Needs: No Transportation Needs  . Lack of Transportation (Medical): No  . Lack of Transportation (Non-Medical): No  Physical Activity: Insufficiently Active  . Days of Exercise per Week: 3 days  . Minutes of Exercise per Session: 30 min  Stress: No Stress Concern Present  . Feeling of Stress : Not at all  Social Connections: Somewhat Isolated  . Frequency of Communication with Friends and Family: More than three times a week  . Frequency of Social Gatherings with Friends and Family: More than three times a week  . Attends Religious Services: More than 4 times per year  . Active Member of Clubs or Organizations: No  . Attends Archivist Meetings: Never  . Marital Status: Widowed  Intimate Partner Violence: Not At Risk  . Fear of Current or Ex-Partner: No  . Emotionally Abused: No  . Physically  Abused: No  . Sexually Abused: No   Family History  Problem Relation Age of Onset  . Hypertension Other   . Asthma Father     Objective: Office vital signs reviewed. BP (!) 126/57   Pulse 70   Temp 98.4 F (36.9 Toni) (Temporal)   Ht 5' (1.524 m)   Wt 131 lb (59.4 kg)   SpO2 100%   BMI 25.58 kg/m   Physical Examination:  General: Awake, alert, No acute distress HEENT: Normal; tympanic membranes occluded by wax bilaterally.  Sclera white. Cardio: Regular rate and rhythm, S1S2 heard, no murmurs appreciated Pulm: clear to auscultation bilaterally, no wheezes, rhonchi or rales; normal work of breathing on room air MSK: Requires assistance for ambulation.  Uses  assistive device for ambulation.  Assessment/ Plan: 86 y.o. female   1. Essential hypertension Controlled. - CMP14+EGFR  2. Stage 3b chronic kidney disease Check renal function - CMP14+EGFR - CBC  3. History of stroke  4. Bilateral impacted cerumen Irrigated.  She will contact me if she continues to have difficulties with hearing.  At which point, we will consider referral to audiology  5. Serum calcium elevated - PTH, Intact and Calcium  6. Weight loss I suspect this is been related to her recent change in diet.  However, would like to see her back in the next couple months for weight recheck.  She did not endorse any red flag signs or symptoms today.   No orders of the defined types were placed in this encounter.  No orders of the defined types were placed in this encounter.    [name], DO [place] [phone]

## 2019-06-27 NOTE — Patient Instructions (Addendum)
Ears were rinsed out today.  You had labs performed today.  You will be contacted with the results of the labs once they are available, usually in the next 3 business days for routine lab work.  If you have an active my chart account, they will be released to your MyChart.  If you prefer to have these labs released to you via telephone, please let us know.  If you had a pap smear or biopsy performed, expect to be contacted in about 7-10 days.  You've lost about 10 pounds since our visit in July.  I know you're working on diet so this is probably fine but I want to check you again in 3 months just to make sure it's stable.  Happy early birthday!!

## 2019-06-28 LAB — CMP14+EGFR
ALT: 9 IU/L (ref 0–32)
AST: 13 IU/L (ref 0–40)
Albumin/Globulin Ratio: 1.6 (ref 1.2–2.2)
Albumin: 4.1 g/dL (ref 3.5–4.6)
Alkaline Phosphatase: 93 IU/L (ref 39–117)
BUN/Creatinine Ratio: 12 (ref 12–28)
BUN: 16 mg/dL (ref 10–36)
Bilirubin Total: 0.4 mg/dL (ref 0.0–1.2)
CO2: 21 mmol/L (ref 20–29)
Calcium: 11.2 mg/dL — ABNORMAL HIGH (ref 8.7–10.3)
Chloride: 106 mmol/L (ref 96–106)
Creatinine, Ser: 1.3 mg/dL — ABNORMAL HIGH (ref 0.57–1.00)
GFR calc Af Amer: 41 mL/min/{1.73_m2} — ABNORMAL LOW (ref >59)
GFR calc non Af Amer: 35 mL/min/{1.73_m2} — ABNORMAL LOW (ref >59)
Globulin, Total: 2.5 g/dL (ref 1.5–4.5)
Glucose: 83 mg/dL (ref 65–99)
Potassium: 5.4 mmol/L — ABNORMAL HIGH (ref 3.5–5.2)
Sodium: 146 mmol/L — ABNORMAL HIGH (ref 134–144)
Total Protein: 6.6 g/dL (ref 6.0–8.5)

## 2019-06-28 LAB — CBC
Hematocrit: 41.7 % (ref 34.0–46.6)
Hemoglobin: 13.7 g/dL (ref 11.1–15.9)
MCH: 26.6 pg (ref 26.6–33.0)
MCHC: 32.9 g/dL (ref 31.5–35.7)
MCV: 81 fL (ref 79–97)
Platelets: 206 10*3/uL (ref 150–450)
RBC: 5.15 x10E6/uL (ref 3.77–5.28)
RDW: 14.2 % (ref 11.7–15.4)
WBC: 4.9 10*3/uL (ref 3.4–10.8)

## 2019-06-28 LAB — PTH, INTACT AND CALCIUM: PTH: 84 pg/mL — ABNORMAL HIGH (ref 15–65)

## 2019-06-29 ENCOUNTER — Other Ambulatory Visit: Payer: Self-pay | Admitting: Family Medicine

## 2019-06-29 DIAGNOSIS — E210 Primary hyperparathyroidism: Secondary | ICD-10-CM

## 2019-07-04 ENCOUNTER — Encounter: Payer: Self-pay | Admitting: *Deleted

## 2019-07-17 ENCOUNTER — Other Ambulatory Visit: Payer: Self-pay | Admitting: Family Medicine

## 2019-07-17 DIAGNOSIS — I10 Essential (primary) hypertension: Secondary | ICD-10-CM

## 2019-07-19 ENCOUNTER — Other Ambulatory Visit: Payer: Self-pay

## 2019-07-19 ENCOUNTER — Other Ambulatory Visit: Payer: Self-pay | Admitting: "Endocrinology

## 2019-07-19 ENCOUNTER — Ambulatory Visit (INDEPENDENT_AMBULATORY_CARE_PROVIDER_SITE_OTHER): Payer: Medicare HMO | Admitting: "Endocrinology

## 2019-07-19 ENCOUNTER — Encounter: Payer: Self-pay | Admitting: "Endocrinology

## 2019-07-19 VITALS — BP 148/72 | HR 69 | Ht 60.0 in | Wt 129.0 lb

## 2019-07-19 DIAGNOSIS — R4189 Other symptoms and signs involving cognitive functions and awareness: Secondary | ICD-10-CM | POA: Diagnosis not present

## 2019-07-19 DIAGNOSIS — E210 Primary hyperparathyroidism: Secondary | ICD-10-CM | POA: Diagnosis not present

## 2019-07-19 DIAGNOSIS — M6282 Rhabdomyolysis: Secondary | ICD-10-CM | POA: Diagnosis not present

## 2019-07-19 DIAGNOSIS — I10 Essential (primary) hypertension: Secondary | ICD-10-CM | POA: Diagnosis not present

## 2019-07-19 NOTE — Progress Notes (Signed)
Endocrinology Consult Note       07/19/2019, 6:03 PM  Toni Gomez is a 86 y.o.-year-old female, referred by her  [name], DO  , for evaluation for hypercalcemia/hyperparathyroidism.   Past Medical History:  Diagnosis Date  . Elevated troponin 03/26/2018  . Hyperlipidemia   . Hypertension   . Renal disorder    chronic kidney disease  . Rhabdomyolysis   . Stroke [facility])     Past Surgical History:  Procedure Laterality Date  . BREAST SURGERY  1980   R breast removed, not cancer    Social History   Tobacco Use  . Smoking status: Never Smoker  . Smokeless tobacco: Never Used  Substance Use Topics  . Alcohol use: No  . Drug use: No    Family History  Problem Relation Age of Onset  . Hypertension Other   . Hypertension Mother   . Stroke Mother   . Asthma Father     Outpatient Encounter Medications as of 07/19/2019  Medication Sig  . acetaminophen (TYLENOL) 650 MG CR tablet Take 650 mg by mouth every 8 (eight) hours as needed for pain.  Marland Kitchen amLODipine (NORVASC) 5 MG tablet TAKE 1 & 1/2 TABLETS BY MOUTH ONCE DAILY.  Marland Kitchen aspirin EC 325 MG tablet Take 1 tablet (325 mg total) by mouth daily.  Marland Kitchen atorvastatin (LIPITOR) 40 MG tablet TAKE 1 TABLET ONCE DAILY AT 6 PM.   No facility-administered encounter medications on file as of 07/19/2019.    No Known Allergies   HPI  Toni Gomez was diagnosed with hypercalcemia in June 2020.  She is elderly patient, presenting with her power of attorney.  She is a poor historian.  History is obtained from review of her medical records.    Patient has no previously known history of parathyroid, pituitary, adrenal dysfunctions; no family history of such dysfunctions. -Review of herreferral package of most recent labs reveals calcium of 11.2 the corresponding PTH of 84 on February 02/05/2020. -She did not have recent bone density to review.  No prior history of fragility  fractures or falls. No history of  kidney stones.  No history of CKD. Last BUN/Cr: 16/1.3  she is not on HCTZ or other thiazide therapy.  Her vitamin D status is not known.   she is not on calcium supplements,  she eats dairy and green, leafy, vegetables on average amounts.  she does not have a family history of hypercalcemia, pituitary tumors, thyroid cancer, or osteoporosis.    ROS:  Constitutional:  + Fluctuating body weight, wheelchair bound due to disequilibrium , - fatigue, no subjective hyperthermia, no subjective hypothermia Eyes: no blurry vision, no xerophthalmia ENT: no sore throat, no nodules palpated in throat, no dysphagia/odynophagia, no hoarseness Cardiovascular: no Chest Pain, no Shortness of Breath, no palpitations, no leg swelling Respiratory: no cough, no shortness of breath  Gastrointestinal: no Nausea/Vomiting/Diarhhea Musculoskeletal: no muscle/joint aches Skin: no rashes Neurological: no tremors, no numbness, no tingling, no dizziness, + wheelchair-bound. Psychiatric: no depression, no anxiety  PE: BP (!) 148/72   Pulse 69   Ht 5' (1.524 m)   Wt 129 lb (58.5 kg)  BMI 25.19 kg/m , Body mass index is 25.19 kg/m. Wt Readings from Last 3 Encounters:  07/19/19 129 lb (58.5 kg)  06/27/19 131 lb (59.4 kg)  01/12/19 139 lb 3.2 oz (63.1 kg)    Constitutional:  + BMI of 25.6, + wheelchair-bound, not in acute distress, normal state of mind Eyes: PERRLA, EOMI, no exophthalmos ENT: moist mucous membranes, no gross thyromegaly, no gross cervical lymphadenopathy Cardiovascular: normal precordial activity, Regular Rate and Rhythm, no Murmur/Rubs/Gallops Respiratory:  adequate breathing efforts, no gross chest deformity, Clear to auscultation bilaterally Gastrointestinal: abdomen soft, Non -tender, No distension, Bowel Sounds present Musculoskeletal: no gross deformities, strength intact in all four extremities Skin: moist, warm, no rashes Neurological: no tremor  with outstretched hands, Deep tendon reflexes normal in bilateral lower extremities.     CMP ( most recent) CMP     Component Value Date/Time   NA 146 (H) 06/27/2019 1431   K 5.4 (H) 06/27/2019 1431   CL 106 06/27/2019 1431   CO2 21 06/27/2019 1431   GLUCOSE 83 06/27/2019 1431   GLUCOSE 103 (H) 10/29/2018 1912   BUN 16 06/27/2019 1431   CREATININE 1.30 (H) 06/27/2019 1431   CALCIUM 11.2 (H) 06/27/2019 1431   PROT 6.6 06/27/2019 1431   ALBUMIN 4.1 06/27/2019 1431   AST 13 06/27/2019 1431   ALT 9 06/27/2019 1431   ALKPHOS 93 06/27/2019 1431   BILITOT 0.4 06/27/2019 1431   GFRNONAA 35 (L) 06/27/2019 1431   GFRAA 41 (L) 06/27/2019 1431     Diabetic Labs (most recent): Lab Results  Component Value Date   HGBA1C 5.4 10/30/2018   HGBA1C 5.3 03/27/2018     Lipid Panel ( most recent) Lipid Panel     Component Value Date/Time   CHOL 222 (H) 10/30/2018 0433   TRIG 80 10/30/2018 0433   HDL 50 10/30/2018 0433   CHOLHDL 4.4 10/30/2018 0433   VLDL 16 10/30/2018 0433   LDLCALC 156 (H) 10/30/2018 0433      Lab Results  Component Value Date   TSH 3.619 03/28/2018   FREET4 0.89 03/28/2018      Assessment: 1. Hypercalcemia / Hyperparathyroidism  Plan: Patient has had several instances of elevated calcium, with the highest level being at 11.2 mg/dL. A corresponding intact PTH level was also high, at 84.  -Her vitamin D status not known .  - No apparent complications from hypercalcemia/hyperparathyroidism: no history of  nephrolithiasis,  osteoporosis,fragility fractures. No abdominal pain, no major mood disorders, no bone pain.  - I discussed with the patient and her power of attorney about the physiology of calcium and parathyroid hormone, and possible  effects of  increased PTH/ Calcium , including kidney stones, cardiac dysrhythmias, osteoporosis, abdominal pain, etc.   - The work up so far is not sufficient to reach a conclusion for definitive therapy.  she  needs more  studies to confirm and classify the parathyroid dysfunction she may have. I will proceed to obtain  repeat intact PTH/calcium, serum magnesium, serum phosphorus, PTH RP, vitamin D panel.  It is impractical  to obtain 24-hour urine calcium/creatinine in this patient.   She is not a surgical candidate even if she is confirmed to have primary hyperparathyroidism.  -If she is found to have persistent PTH dependent hypercalcemia, she will be considered for Sensipar therapy.   -She may need  DEXA scan to include the distal  33% of  radius for evaluation of cortical bone on subsequent visits.   She is  advised to maintain close follow-up with her PMD.   - Time spent with the patient: 45 minutes, of which >50% was spent in obtaining information about her symptoms, reviewing her previous labs, evaluations, and treatments, counseling her about her hypercalcemia/hyperparathyroidism, and developing a plan to confirm the diagnosis and long term treatment as necessary.  Please refer to " Patient Self Inventory" in the Media  tab for reviewed elements of pertinent patient history.  Toni Gomez and her power of attorney participated in the discussions, expressed understanding, and voiced agreement with the above plans.  All questions were answered to her satisfaction. she is encouraged to contact clinic should she have any questions or concerns prior to her return visit.  - Return in about 2 weeks (around 08/02/2019), or phone visit, for Labs Today- Non-Fasting Ok.   Glade Lloyd, MD [facility] [address] [place], [place] [address] Phone: [phone]  Fax: [phone]    This note was partially dictated with voice recognition software. Similar sounding words can be transcribed inadequately or may not  be corrected upon review.  07/19/2019, 6:03 PM

## 2019-07-29 LAB — PARATHYROID HORMONE, INTACT (NO CA): PTH: 82 pg/mL — ABNORMAL HIGH (ref 15–65)

## 2019-07-29 LAB — PHOSPHORUS: Phosphorus: 3.4 mg/dL (ref 3.0–4.3)

## 2019-07-29 LAB — T4, FREE: Free T4: 1.08 ng/dL (ref 0.82–1.77)

## 2019-07-29 LAB — PTH-RELATED PEPTIDE: PTH-related peptide: <2 pmol/L

## 2019-07-29 LAB — TSH: TSH: 2.35 u[IU]/mL (ref 0.450–4.500)

## 2019-07-29 LAB — MAGNESIUM: Magnesium: 2.4 mg/dL — ABNORMAL HIGH (ref 1.6–2.3)

## 2019-08-04 ENCOUNTER — Ambulatory Visit: Payer: Medicare HMO | Admitting: "Endocrinology

## 2019-08-14 ENCOUNTER — Encounter: Payer: Self-pay | Admitting: "Endocrinology

## 2019-08-14 ENCOUNTER — Ambulatory Visit (INDEPENDENT_AMBULATORY_CARE_PROVIDER_SITE_OTHER): Payer: Medicare HMO | Admitting: "Endocrinology

## 2019-08-14 ENCOUNTER — Other Ambulatory Visit: Payer: Self-pay

## 2019-08-14 VITALS — BP 157/69 | HR 63 | Ht 60.0 in | Wt 129.6 lb

## 2019-08-14 DIAGNOSIS — E210 Primary hyperparathyroidism: Secondary | ICD-10-CM

## 2019-08-14 MED ORDER — CINACALCET HCL 30 MG PO TABS: 30.0000 mg | ORAL_TABLET | Freq: Every day | ORAL | 3 refills | Status: DC

## 2019-08-14 NOTE — Progress Notes (Signed)
Endocrinology follow-up note    08/14/2019, 7:42 PM  Toni Gomez is a 86 y.o.-year-old female, returning with her power of attorney , Toni Gomez, after she was seen recently in consultation for hypercalcemia/hyper parathyroidism. PCP: [name], DO    Past Medical History:  Diagnosis Date  . Elevated troponin 03/26/2018  . Hyperlipidemia   . Hypertension   . Renal disorder    chronic kidney disease  . Rhabdomyolysis   . Stroke [facility])     Past Surgical History:  Procedure Laterality Date  . BREAST SURGERY  1980   R breast removed, not cancer    Social History   Tobacco Use  . Smoking status: Never Smoker  . Smokeless tobacco: Never Used  Substance Use Topics  . Alcohol use: Gomez  . Drug use: Gomez    Family History  Problem Relation Age of Onset  . Hypertension Other   . Hypertension Mother   . Stroke Mother   . Asthma Father     Outpatient Encounter Medications as of 08/14/2019  Medication Sig  . acetaminophen (TYLENOL) 650 MG CR tablet Take 650 mg by mouth every 8 (eight) hours as needed for pain.  Marland Kitchen amLODipine (NORVASC) 5 MG tablet TAKE 1 & 1/2 TABLETS BY MOUTH ONCE DAILY.  Marland Kitchen aspirin EC 325 MG tablet Take 1 tablet (325 mg total) by mouth daily.  Marland Kitchen atorvastatin (LIPITOR) 40 MG tablet TAKE 1 TABLET ONCE DAILY AT 6 PM.  . cinacalcet (SENSIPAR) 30 MG tablet Take 1 tablet (30 mg total) by mouth daily with breakfast.   Gomez facility-administered encounter medications on file as of 08/14/2019.    Gomez Known Allergies   HPI  Toni Gomez was diagnosed with hypercalcemia in June 2020.  She is elderly patient, presenting with her power of attorney.  She is a poor historian.  History is obtained from review of her medical records.    Patient has Gomez previously known history of parathyroid, pituitary, adrenal dysfunctions; Gomez family history of such dysfunctions. -Review of herreferral package of most recent  labs reveals calcium of 11.2 the corresponding PTH of 84 on February 02/05/2020.  Her most recent repeat labs show PTH of 82, undetectable PTH related peptide. -She did not have recent bone density to review.  Gomez prior history of fragility fractures or falls. Gomez history of  kidney stones.  Gomez history of CKD. Last BUN/Cr: 16/1.3  she is not on HCTZ or other thiazide therapy.  Her vitamin D status is not known.   she is not on calcium supplements,  she eats dairy and green, leafy, vegetables on average amounts.  she does not have a family history of hypercalcemia, pituitary tumors, thyroid cancer, or osteoporosis.    ROS:   PE: BP (!) 157/69   Pulse 63   Ht 5' (1.524 m)   Wt 129 lb 9.6 oz (58.8 kg)   BMI 25.31 kg/m , Body mass index is 25.31 kg/m. Wt Readings from Last 3 Encounters:  08/14/19 129 lb 9.6 oz (58.8 kg)  07/19/19 129 lb (58.5 kg)  06/27/19 131 lb (59.4 kg)    CMP ( most recent)  CMP     Component Value Date/Time   NA 146 (H) 06/27/2019 1431   K 5.4 (H) 06/27/2019 1431   CL 106 06/27/2019 1431   CO2 21 06/27/2019 1431   GLUCOSE 83 06/27/2019 1431   GLUCOSE 103 (H) 10/29/2018 1912   BUN 16 06/27/2019 1431   CREATININE 1.30 (H) 06/27/2019 1431   CALCIUM 11.2 (H) 06/27/2019 1431   PROT 6.6 06/27/2019 1431   ALBUMIN 4.1 06/27/2019 1431   AST 13 06/27/2019 1431   ALT 9 06/27/2019 1431   ALKPHOS 93 06/27/2019 1431   BILITOT 0.4 06/27/2019 1431   GFRNONAA 35 (L) 06/27/2019 1431   GFRAA 41 (L) 06/27/2019 1431     Diabetic Labs (most recent): Lab Results  Component Value Date   HGBA1C 5.4 10/30/2018   HGBA1C 5.3 03/27/2018     Lipid Panel ( most recent) Lipid Panel     Component Value Date/Time   CHOL 222 (H) 10/30/2018 0433   TRIG 80 10/30/2018 0433   HDL 50 10/30/2018 0433   CHOLHDL 4.4 10/30/2018 0433   VLDL 16 10/30/2018 0433   LDLCALC 156 (H) 10/30/2018 0433      Lab Results  Component Value Date   TSH 2.350 07/19/2019   TSH 3.619  03/28/2018   FREET4 1.08 07/19/2019   FREET4 0.89 03/28/2018      Assessment: 1. Hypercalcemia / Hyperparathyroidism  Plan: Patient has had several instances of elevated calcium, with the highest level being at 11.2 mg/dL. A corresponding intact PTH level was also high, at 84.  Her repeat labs show PTH of 82 and undetectable PTH RP. -Her vitamin D status not known .  - Gomez apparent complications from hypercalcemia/hyperparathyroidism: Gomez history of  nephrolithiasis,  osteoporosis,fragility fractures. Gomez abdominal pain, Gomez major mood disorders, Gomez bone pain.  - I discussed with the patient and her power of attorney, Toni Gomez, about the physiology of calcium and parathyroid hormone, and possible  effects of  increased PTH/ Calcium , including kidney stones, cardiac dysrhythmias, osteoporosis, abdominal pain, etc.   It is impractical  to obtain 24-hour urine calcium/creatinine in this patient.   She is not a candidate for surgery.  However, she may benefit from low-dose Sensipar.  I discussed and initiated Sensipar 30 mg p.o. daily at breakfast with plan to repeat PTH/calcium, vitamin D panel in 3 months with office visit.    -She may need  DEXA scan to include the distal  33% of  radius for evaluation of cortical bone on subsequent visits.   She is advised to maintain close follow-up with her PMD.     - Time spent on this patient care encounter:  25 minutes of which 50% was spent in  counseling and the rest reviewing  her current and  previous labs / studies and medications  doses and developing a plan for long term care. [name]  participated in the discussions, expressed understanding, and voiced agreement with the above plans.  All questions were answered to her satisfaction. she is encouraged to contact clinic should she have any questions or concerns prior to her return visit.   - Return in about 3 months (around 11/14/2019) for Follow up with Pre-visit Labs.   Glade Lloyd,  MD [facility] [address] [place], [place] [address] Phone: [phone]  Fax: [phone]    This note was partially dictated with voice recognition software. Similar sounding words can be transcribed inadequately or may  not  be corrected upon review.  08/14/2019, 7:42 PM

## 2019-08-29 DIAGNOSIS — M79676 Pain in unspecified toe(s): Secondary | ICD-10-CM | POA: Diagnosis not present

## 2019-08-29 DIAGNOSIS — L84 Corns and callosities: Secondary | ICD-10-CM | POA: Diagnosis not present

## 2019-08-29 DIAGNOSIS — B351 Tinea unguium: Secondary | ICD-10-CM | POA: Diagnosis not present

## 2019-08-29 DIAGNOSIS — I70203 Unspecified atherosclerosis of native arteries of extremities, bilateral legs: Secondary | ICD-10-CM | POA: Diagnosis not present

## 2019-09-01 ENCOUNTER — Other Ambulatory Visit: Payer: Self-pay | Admitting: Family Medicine

## 2019-09-08 ENCOUNTER — Other Ambulatory Visit: Payer: Self-pay | Admitting: Family Medicine

## 2019-09-08 DIAGNOSIS — I10 Essential (primary) hypertension: Secondary | ICD-10-CM

## 2019-09-20 ENCOUNTER — Other Ambulatory Visit: Payer: Self-pay

## 2019-09-20 ENCOUNTER — Ambulatory Visit (INDEPENDENT_AMBULATORY_CARE_PROVIDER_SITE_OTHER): Payer: Medicare HMO | Admitting: Family Medicine

## 2019-09-20 VITALS — BP 147/77 | HR 78 | Temp 97.6°F | Ht 60.0 in | Wt 128.0 lb

## 2019-09-20 DIAGNOSIS — R634 Abnormal weight loss: Secondary | ICD-10-CM

## 2019-09-20 DIAGNOSIS — E210 Primary hyperparathyroidism: Secondary | ICD-10-CM

## 2019-09-20 DIAGNOSIS — J3489 Other specified disorders of nose and nasal sinuses: Secondary | ICD-10-CM | POA: Diagnosis not present

## 2019-09-20 DIAGNOSIS — I10 Essential (primary) hypertension: Secondary | ICD-10-CM | POA: Diagnosis not present

## 2019-09-20 MED ORDER — AZELASTINE HCL 0.1 % NA SOLN: 1.0000 | Freq: Two times a day (BID) | NASAL | 12 refills | Status: AC

## 2019-09-20 NOTE — Progress Notes (Signed)
Subjective: CC: Follow-up hypertension PCP: Toni Norlander, DO [id number] C Toni Gomez is a 86 y.o. female presenting to clinic today for:  1.  Hypertension/weight loss Patient reports compliance with medications.  She is asking for advice on what are good things to eat today.  No chest pain, shortness of breath, falls.  She has chronic lower extremity edema that is stable.  2.  Rhinorrhea Patient reports ongoing rhinorrhea and postnasal drip.  She is not currently taking any allergy medicines but would be willing to use a nasal spray if recommended.  No fevers, chills  3.  Hyperparathyroidism Patient with hyperparathyroidism.  She is established with Dr. Dorris Gomez who has placed her on Sensipar.  She notes that she has been compliant with the medication and is tolerating it without difficulty but that it is quite expensive.  Her last dose was about 6 days ago.  Denies any heart palpitations, mental status changes, new aching or urinary discomfort   ROS: Per HPI  No Known Allergies Past Medical History:  Diagnosis Date  . Elevated troponin 03/26/2018  . Hyperlipidemia   . Hypertension   . Renal disorder    chronic kidney disease  . Rhabdomyolysis   . Stroke [facility])     Current Outpatient Medications:  .  acetaminophen (TYLENOL) 650 MG CR tablet, Take 650 mg by mouth every 8 (eight) hours as needed for pain., Disp: , Rfl:  .  amLODipine (NORVASC) 5 MG tablet, TAKE 1 & 1/2 TABLETS BY MOUTH ONCE DAILY., Disp: 45 tablet, Rfl: 0 .  aspirin EC 325 MG tablet, Take 1 tablet (325 mg total) by mouth daily., Disp: 100 tablet, Rfl: 3 .  atorvastatin (LIPITOR) 40 MG tablet, TAKE 1 TABLET ONCE DAILY AT 6 PM., Disp: 90 tablet, Rfl: 0 .  cinacalcet (SENSIPAR) 30 MG tablet, Take 1 tablet (30 mg total) by mouth daily with breakfast. (Patient not taking: Reported on 09/20/2019), Disp: 30 tablet, Rfl: 3 Social History   Socioeconomic History  . Marital status: Widowed    Spouse name: Not on file  .  Number of children: 0  . Years of education: Not on file  . Highest education level: 7th grade  Occupational History  . Occupation: retired  Tobacco Use  . Smoking status: Never Smoker  . Smokeless tobacco: Never Used  Substance and Sexual Activity  . Alcohol use: No  . Drug use: No  . Sexual activity: Not Currently  Other Topics Concern  . Not on file  Social History Narrative  . Not on file   Social Determinants of Health   Financial Resource Strain: Low Risk   . Difficulty of Paying Living Expenses: Not hard at all  Food Insecurity: No Food Insecurity  . Worried About Charity fundraiser in the Last Year: Never true  . Ran Out of Food in the Last Year: Never true  Transportation Needs: No Transportation Needs  . Lack of Transportation (Medical): No  . Lack of Transportation (Non-Medical): No  Physical Activity: Insufficiently Active  . Days of Exercise per Week: 3 days  . Minutes of Exercise per Session: 30 min  Stress: No Stress Concern Present  . Feeling of Stress : Not at all  Social Connections: Somewhat Isolated  . Frequency of Communication with Friends and Family: More than three times a week  . Frequency of Social Gatherings with Friends and Family: More than three times a week  . Attends Religious Services: More than 4 times per year  .  Active Member of Clubs or Organizations: No  . Attends Archivist Meetings: Never  . Marital Status: Widowed  Intimate Partner Violence: Not At Risk  . Fear of Current or Ex-Partner: No  . Emotionally Abused: No  . Physically Abused: No  . Sexually Abused: No   Family History  Problem Relation Age of Onset  . Hypertension Other   . Hypertension Mother   . Stroke Mother   . Asthma Father     Objective: Office vital signs reviewed. BP (!) 147/77   Pulse 78   Temp 97.6 F (36.4 C) (Temporal)   Ht 5' (1.524 m)   Wt 128 lb (58.1 kg)   BMI 25.00 kg/m   Physical Examination:  General: Awake, alert, No  acute distress HEENT: Normal; nasal turbinates with clear rhinorrhea. No oropharyngeal erythema. Cardio: Regular rate and rhythm, S1S2 heard, no murmurs appreciated Pulm: clear to auscultation bilaterally, no wheezes, rhonchi or rales; normal work of breathing on room air MSK: difficulty getting up from seated position independently but can do it with some assistance.    Uses assistive device for ambulation.  Assessment/ Plan: 86 y.o. female   1. Rhinorrhea - azelastine (ASTELIN) 0.1 % nasal spray; Place 1 spray into both nostrils 2 (two) times daily.  Dispense: 30 mL; Refill: 12  2. Primary hyperparathyroidism ([place]) Managed by Dr Toni Gomez.  Will see if Almyra Free, our clinical pharmacist, can get a tier reduction for Sensipar given expense.  I encouraged Toni Gomez to follow up with Dr Toni Gomez as scheduled and resume medication ASAP.  3. Weight loss 3# weight loss since last visit.  Reinforced protein and vegetables in diet.  Follow up in 3 months for repeat.  If ongoing weight loss, I am going to pursue CT abd/ chest  4. Essential hypertension Controlled for age.  No orders of the defined types were placed in this encounter.  No orders of the defined types were placed in this encounter.    Toni Norlander, DO [place] [phone]

## 2019-09-20 NOTE — Patient Instructions (Addendum)
Keep taking the Sensipar/ Cinacalcet (this helps lower your high calcium).  High calcium can cause heart problems.   Almyra Free our pharmacist will try and get this cheaper for you.  She will call you soon.  Keep seeing Dr Dorris Fetch in [place].  Your weight is down 3 lbs since our last visit.  Make sure that you are eating enough and getting enough water in.  I have sent Astelin nasal spray for your runny nose.  You can use twice daily if needed.  See me back in 3 months for weight check.

## 2019-09-25 ENCOUNTER — Ambulatory Visit: Payer: Medicare HMO | Admitting: Family Medicine

## 2019-10-05 ENCOUNTER — Telehealth: Payer: Self-pay | Admitting: Pharmacist

## 2019-10-05 ENCOUNTER — Other Ambulatory Visit: Payer: Self-pay | Admitting: Family Medicine

## 2019-10-05 DIAGNOSIS — I10 Essential (primary) hypertension: Secondary | ICD-10-CM

## 2019-10-05 NOTE — Telephone Encounter (Signed)
Call placed to [facility] clinical review department regarding patient's Sensipar (cincalcet)  [phone]  Reference [id number]  24-72hrs turnaround time for response

## 2019-10-05 NOTE — Telephone Encounter (Signed)
VM from [facility] [id number] Clinical review, needs more information for PA, call [phone]

## 2019-10-05 NOTE — Telephone Encounter (Signed)
Patient unable to afford Sensipar for hyperparathyroidism.  Requested tier exception from [facility], paperwork faxed & confirmed.  Will f/u once result is known

## 2019-10-26 NOTE — Telephone Encounter (Signed)
Call placed to [facility] Review--patient's generic copay for cinacalcet is $3.70/for 30 days  No further tier exception needed

## 2019-10-27 ENCOUNTER — Ambulatory Visit (INDEPENDENT_AMBULATORY_CARE_PROVIDER_SITE_OTHER): Payer: Medicare HMO

## 2019-10-27 DIAGNOSIS — Z0000 Encounter for general adult medical examination without abnormal findings: Secondary | ICD-10-CM

## 2019-10-27 NOTE — Patient Instructions (Signed)
  Toni Gomez , Thank you for taking time to come for your Medicare Wellness Visit. I appreciate your ongoing commitment to your health goals. Please review the following plan we discussed and let me know if I can assist you in the future.   These are the goals we discussed: Goals    . Exercise 150 min/wk Moderate Activity    . Have 3 meals a day       This is a list of the screening recommended for you and due dates:  Health Maintenance  Topic Date Due  . DEXA scan (bone density measurement)  Never done  . COVID-19 Vaccine (1) 11/12/2019*  . Flu Shot  12/17/2019  . Tetanus Vaccine  02/17/2029  . Pneumonia vaccines  Discontinued  *Topic was postponed. The date shown is not the original due date.

## 2019-10-27 NOTE — Progress Notes (Signed)
MEDICARE ANNUAL WELLNESS VISIT  10/27/2019  Telephone Visit Disclaimer This Medicare AWV was conducted by telephone due to national recommendations for restrictions regarding the COVID-19 Pandemic (e.g. social distancing).  I verified, using two identifiers, that I am speaking with Toni Gomez or their authorized healthcare agent. I discussed the limitations, risks, security, and privacy concerns of performing an evaluation and management service by telephone and the potential availability of an in-person appointment in the future. The patient expressed understanding and agreed to proceed.   Subjective:  Toni Gomez is a 86 y.o. female patient of Toni Norlander, DO who had a Medicare Annual Wellness Visit today via telephone. [name] lives locally here in [place]. She is a widow and lives alone. She has some difficulty hearing so her nephew spoke with me on the phone and relayed the questions and answers to the patient. She is a very pleasant lady and her nephew was as well. She is retired and worked at [employer] for 25 years. She enjoys watching TV. She does very well for herself. She doesn't do any regular exercise routines but she does move her legs an arms on a regular basis. At her next appt she wants to discuss hearing aids with Dr Toni Gomez. She is due for an eye exam and [employer] has called to help her out with that. She was an only child and has no brothers or sisters.   Patient Care Team: Toni Norlander, DO as PCP - General (Family Medicine) Toni Commons, MD as PCP - Cardiology (Cardiology) Toni Gomez, [facility] (Pharmacist)  Advanced Directives 10/27/2019 10/29/2018 10/12/2018 05/23/2018 05/05/2018 05/03/2018 05/02/2018  Does Patient Have a Medical Advance Directive? No No No No Yes Yes Yes  Type of Advance Directive - - Public librarian;Living will (No Data) (No Data) -  Does patient want to make changes to medical advance directive? - - - No -  Patient declined No - Patient declined No - Patient declined No - Patient declined  Copy of [place] in Chart? - - - No - copy requested - - -  Would patient like information on creating a medical advance directive? No - Patient declined No - Patient declined No - Patient declined - No - Patient declined No - Patient declined No - Patient declined    Hospital Utilization Over the Past 12 Months: # of hospitalizations or ER visits: 0 # of surgeries: 0  Review of Systems    Patient reports that her overall health is unchanged compared to last year.  History obtained from chart review  Patient Reported Readings (BP, Pulse, CBG, Weight, etc) none  Pain Assessment Pain : No/denies pain     Current Medications & Allergies (verified) Allergies as of 10/27/2019   No Known Allergies     Medication List       Accurate as of October 27, 2019  1:41 PM. If you have any questions, ask your nurse or doctor.        acetaminophen 650 MG CR tablet Commonly known as: TYLENOL Take 650 mg by mouth every 8 (eight) hours as needed for pain.   amLODipine 5 MG tablet Commonly known as: NORVASC TAKE 1 & 1/2 TABLETS BY MOUTH ONCE DAILY.   aspirin EC 325 MG tablet Take 1 tablet (325 mg total) by mouth daily.   atorvastatin 40 MG tablet Commonly known as: LIPITOR TAKE 1 TABLET ONCE DAILY AT 6 PM.  azelastine 0.1 % nasal spray Commonly known as: ASTELIN Place 1 spray into both nostrils 2 (two) times daily.   cinacalcet 30 MG tablet Commonly known as: Sensipar Take 1 tablet (30 mg total) by mouth daily with breakfast.       History (reviewed): Past Medical History:  Diagnosis Date  . Elevated troponin 03/26/2018  . Hyperlipidemia   . Hypertension   . Renal disorder    chronic kidney disease  . Rhabdomyolysis   . Stroke [facility])    Past Surgical History:  Procedure Laterality Date  . BREAST SURGERY  1980   R breast removed, not cancer   Family History    Problem Relation Age of Onset  . Hypertension Other   . Hypertension Mother   . Stroke Mother   . Asthma Father    Social History   Socioeconomic History  . Marital status: Widowed    Spouse name: Not on file  . Number of children: 0  . Years of education: Not on file  . Highest education Gomez: 7th grade  Occupational History  . Occupation: retired  Tobacco Use  . Smoking status: Never Smoker  . Smokeless tobacco: Never Used  Vaping Use  . Vaping Use: Never used  Substance and Sexual Activity  . Alcohol use: No  . Drug use: No  . Sexual activity: Not Currently  Other Topics Concern  . Not on file  Social History Narrative  . Not on file   Social Determinants of Health   Financial Resource Strain:   . Difficulty of Paying Living Expenses:   Food Insecurity:   . Worried About Charity fundraiser in the Last Year:   . Arboriculturist in the Last Year:   Transportation Needs:   . Film/video editor (Medical):   Toni Kitchen Lack of Transportation (Non-Medical):   Physical Activity:   . Days of Exercise per Week:   . Minutes of Exercise per Session:   Stress:   . Feeling of Stress :   Social Connections:   . Frequency of Communication with Friends and Family:   . Frequency of Social Gatherings with Friends and Family:   . Attends Religious Services:   . Active Member of Clubs or Organizations:   . Attends Archivist Meetings:   Toni Kitchen Marital Status:     Activities of Daily Living In your present state of health, do you have any difficulty performing the following activities: 10/27/2019  Hearing? Y  Vision? Y  Comment Wears glasses  Difficulty concentrating or making decisions? N  Walking or climbing stairs? N  Dressing or bathing? N  Doing errands, shopping? N  Preparing Food and eating ? N  Using the Toilet? N  In the past six months, have you accidently leaked urine? N  Do you have problems with loss of bowel control? N  Managing your Medications? N   Managing your Finances? N  Housekeeping or managing your Housekeeping? N  Some recent data might be hidden    Patient Education/ Literacy How often do you need to have someone help you when you read instructions, pamphlets, or other written materials from your doctor or pharmacy?: 1 - Never What is the last grade Gomez you completed in school?: 7th grade  Exercise Current Exercise Habits: The patient does not participate in regular exercise at present, Exercise limited by: None identified  Diet Patient reports consuming 3 meals a day and 2 snack(s) a day Patient reports that her  primary diet is: Regular Patient reports that she does have regular access to food.   Depression Screen PHQ 2/9 Scores 09/20/2019 06/27/2019 01/27/2019 01/12/2019 11/25/2018 07/25/2018  PHQ - 2 Score 0 0 0 0 0 0  PHQ- 9 Score 0 0 - - 0 0     Fall Risk Fall Risk  10/27/2019 09/20/2019 06/27/2019 01/27/2019 11/25/2018  Falls in the past year? 0 0 1 1 1   Number falls in past yr: - - 1 0 1  Injury with Fall? - - 0 0 -  Risk for fall due to : - - History of fall(s);Impaired balance/gait - -  Follow up - - Falls prevention discussed - -     Objective:  Toni Gomez seemed alert and oriented and she participated appropriately during our telephone visit.  Blood Pressure Weight BMI  BP Readings from Last 3 Encounters:  09/20/19 (!) 147/77  08/14/19 (!) 157/69  07/19/19 (!) 148/72   Wt Readings from Last 3 Encounters:  09/20/19 128 lb (58.1 kg)  08/14/19 129 lb 9.6 oz (58.8 kg)  07/19/19 129 lb (58.5 kg)   BMI Readings from Last 1 Encounters:  09/20/19 25.00 kg/m    *Unable to obtain current vital signs, weight, and BMI due to telephone visit type  Hearing/Vision  . Toni Gomez did  seem to have difficulty with hearing/understanding during the telephone conversation . Reports that she has not had a formal eye exam by an eye care professional within the past year . Reports that she has not had a formal hearing  evaluation within the past year *Unable to fully assess hearing and vision during telephone visit type  Cognitive Function: 6CIT Screen 10/12/2018  What Year? 0 points  What month? 0 points  What time? 0 points  Count back from 20 0 points  Months in reverse 0 points  Repeat phrase 0 points  Total Score 0   (Normal:0-7, Significant for Dysfunction: >8)  Normal Cognitive Function Screening: Unable to complete due to difficulty in hearing   Immunization & Health Maintenance Record Immunization History  Administered Date(s) Administered  . Tdap 02/18/2019    Health Maintenance  Topic Date Due  . DEXA SCAN  Never done  . COVID-19 Vaccine (1) 11/12/2019 (Originally 07/15/1935)  . INFLUENZA VACCINE  12/17/2019  . TETANUS/TDAP  02/17/2029  . PNA vac Low Risk Adult  Discontinued       Assessment  This is a routine wellness examination for Toni Gomez.  Health Maintenance: Due or Overdue Health Maintenance Due  Topic Date Due  . DEXA SCAN  Never done    Toni Gomez does not need a referral for Community Assistance: Care Management:   no Social Work:    no Prescription Assistance:  no Nutrition/Diabetes Education:  no   Plan:  Personalized Goals Goals Addressed   None    Personalized Health Maintenance & Screening Recommendations  Bone densitometry screening Glaucoma screening  Lung Cancer Screening Recommended: no (Low Dose CT Chest recommended if Age 64-80 years, 30 pack-year currently smoking OR have quit w/in past 15 years) Hepatitis C Screening recommended: no HIV Screening recommended: no  Advanced Directives: Written information was not prepared per patient's request.  Referrals & Orders No orders of the defined types were placed in this encounter.   Follow-up Plan . Follow-up with Toni Norlander, DO as planned . Schedule for an eye exam and also for a Dexa scan    I have personally reviewed and noted  the following in the patient's  chart:   . Medical and social history . Use of alcohol, tobacco or illicit drugs  . Current medications and supplements . Functional ability and status . Nutritional status . Physical activity . Advanced directives . List of other physicians . Hospitalizations, surgeries, and ER visits in previous 12 months . Vitals . Screenings to include cognitive, depression, and falls . Referrals and appointments  In addition, I have reviewed and discussed with Toni Gomez certain preventive protocols, quality metrics, and best practice recommendations. A written personalized care plan for preventive services as well as general preventive health recommendations is available and can be mailed to the patient at her request.      Rolena Infante LPN 08/14/760

## 2019-11-01 ENCOUNTER — Observation Stay (HOSPITAL_COMMUNITY): Payer: Medicare HMO

## 2019-11-01 ENCOUNTER — Other Ambulatory Visit: Payer: Self-pay

## 2019-11-01 ENCOUNTER — Encounter (HOSPITAL_COMMUNITY): Payer: Self-pay

## 2019-11-01 ENCOUNTER — Observation Stay (HOSPITAL_COMMUNITY)
Admission: EM | Admit: 2019-11-01 | Discharge: 2019-11-03 | Disposition: A | Discharge status: Home Health | Payer: Medicare HMO | Attending: Family Medicine | Admitting: Family Medicine

## 2019-11-01 DIAGNOSIS — M6282 Rhabdomyolysis: Principal | ICD-10-CM

## 2019-11-01 DIAGNOSIS — Z20822 Contact with and (suspected) exposure to covid-19: Secondary | ICD-10-CM | POA: Insufficient documentation

## 2019-11-01 DIAGNOSIS — N183 Chronic kidney disease, stage 3 unspecified: Secondary | ICD-10-CM | POA: Insufficient documentation

## 2019-11-01 DIAGNOSIS — R531 Weakness: Secondary | ICD-10-CM

## 2019-11-01 DIAGNOSIS — Z993 Dependence on wheelchair: Secondary | ICD-10-CM | POA: Diagnosis not present

## 2019-11-01 DIAGNOSIS — Z7982 Long term (current) use of aspirin: Secondary | ICD-10-CM | POA: Insufficient documentation

## 2019-11-01 DIAGNOSIS — Z8673 Personal history of transient ischemic attack (TIA), and cerebral infarction without residual deficits: Secondary | ICD-10-CM | POA: Diagnosis not present

## 2019-11-01 DIAGNOSIS — M7989 Other specified soft tissue disorders: Secondary | ICD-10-CM | POA: Diagnosis not present

## 2019-11-01 DIAGNOSIS — E785 Hyperlipidemia, unspecified: Secondary | ICD-10-CM | POA: Diagnosis not present

## 2019-11-01 DIAGNOSIS — Y9389 Activity, other specified: Secondary | ICD-10-CM | POA: Insufficient documentation

## 2019-11-01 DIAGNOSIS — I4510 Unspecified right bundle-branch block: Secondary | ICD-10-CM | POA: Insufficient documentation

## 2019-11-01 DIAGNOSIS — I10 Essential (primary) hypertension: Secondary | ICD-10-CM | POA: Diagnosis present

## 2019-11-01 DIAGNOSIS — Z743 Need for continuous supervision: Secondary | ICD-10-CM | POA: Diagnosis not present

## 2019-11-01 DIAGNOSIS — W19XXXA Unspecified fall, initial encounter: Secondary | ICD-10-CM | POA: Diagnosis not present

## 2019-11-01 DIAGNOSIS — Z8249 Family history of ischemic heart disease and other diseases of the circulatory system: Secondary | ICD-10-CM | POA: Insufficient documentation

## 2019-11-01 DIAGNOSIS — Z79899 Other long term (current) drug therapy: Secondary | ICD-10-CM | POA: Diagnosis not present

## 2019-11-01 DIAGNOSIS — R6889 Other general symptoms and signs: Secondary | ICD-10-CM | POA: Diagnosis not present

## 2019-11-01 DIAGNOSIS — M47816 Spondylosis without myelopathy or radiculopathy, lumbar region: Secondary | ICD-10-CM | POA: Insufficient documentation

## 2019-11-01 DIAGNOSIS — R55 Syncope and collapse: Secondary | ICD-10-CM | POA: Insufficient documentation

## 2019-11-01 DIAGNOSIS — I129 Hypertensive chronic kidney disease with stage 1 through stage 4 chronic kidney disease, or unspecified chronic kidney disease: Secondary | ICD-10-CM | POA: Insufficient documentation

## 2019-11-01 DIAGNOSIS — Z825 Family history of asthma and other chronic lower respiratory diseases: Secondary | ICD-10-CM | POA: Diagnosis not present

## 2019-11-01 DIAGNOSIS — Z823 Family history of stroke: Secondary | ICD-10-CM | POA: Diagnosis not present

## 2019-11-01 LAB — CBG MONITORING, ED: Glucose-Capillary: 103 mg/dL — ABNORMAL HIGH (ref 70–99)

## 2019-11-01 LAB — CBC WITH DIFFERENTIAL/PLATELET
Abs Immature Granulocytes: 0.02 10*3/uL (ref 0.00–0.07)
Basophils Absolute: 0 10*3/uL (ref 0.0–0.1)
Basophils Relative: 0 %
Eosinophils Absolute: 0 10*3/uL (ref 0.0–0.5)
Eosinophils Relative: 0 %
HCT: 40.2 % (ref 36.0–46.0)
Hemoglobin: 13 g/dL (ref 12.0–15.0)
Immature Granulocytes: 0 %
Lymphocytes Relative: 9 %
Lymphs Abs: 0.9 10*3/uL (ref 0.7–4.0)
MCH: 27.1 pg (ref 26.0–34.0)
MCHC: 32.3 g/dL (ref 30.0–36.0)
MCV: 83.8 fL (ref 80.0–100.0)
Monocytes Absolute: 0.7 10*3/uL (ref 0.1–1.0)
Monocytes Relative: 7 %
Neutro Abs: 7.9 10*3/uL — ABNORMAL HIGH (ref 1.7–7.7)
Neutrophils Relative %: 84 %
Platelets: 221 10*3/uL (ref 150–400)
RBC: 4.8 MIL/uL (ref 3.87–5.11)
RDW: 15.9 % — ABNORMAL HIGH (ref 11.5–15.5)
WBC: 9.5 10*3/uL (ref 4.0–10.5)
nRBC: 0 % (ref 0.0–0.2)

## 2019-11-01 LAB — MAGNESIUM: Magnesium: 2.2 mg/dL (ref 1.7–2.4)

## 2019-11-01 LAB — COMPREHENSIVE METABOLIC PANEL WITH GFR
ALT: 24 U/L (ref 0–44)
AST: 74 U/L — ABNORMAL HIGH (ref 15–41)
Albumin: 3.9 g/dL (ref 3.5–5.0)
Alkaline Phosphatase: 77 U/L (ref 38–126)
Anion gap: 11 (ref 5–15)
BUN: 24 mg/dL — ABNORMAL HIGH (ref 8–23)
CO2: 24 mmol/L (ref 22–32)
Calcium: 10.4 mg/dL — ABNORMAL HIGH (ref 8.9–10.3)
Chloride: 105 mmol/L (ref 98–111)
Creatinine, Ser: 1.17 mg/dL — ABNORMAL HIGH (ref 0.44–1.00)
GFR calc Af Amer: 46 mL/min — ABNORMAL LOW
GFR calc non Af Amer: 40 mL/min — ABNORMAL LOW
Glucose, Bld: 106 mg/dL — ABNORMAL HIGH (ref 70–99)
Potassium: 4.5 mmol/L (ref 3.5–5.1)
Sodium: 140 mmol/L (ref 135–145)
Total Bilirubin: 1.4 mg/dL — ABNORMAL HIGH (ref 0.3–1.2)
Total Protein: 7 g/dL (ref 6.5–8.1)

## 2019-11-01 LAB — SARS CORONAVIRUS 2 BY RT PCR (HOSPITAL ORDER, PERFORMED IN ~~LOC~~ HOSPITAL LAB): SARS Coronavirus 2: NEGATIVE

## 2019-11-01 LAB — TSH: TSH: 1.267 u[IU]/mL (ref 0.350–4.500)

## 2019-11-01 LAB — CK: Total CK: 3208 U/L — ABNORMAL HIGH (ref 38–234)

## 2019-11-01 MED ORDER — HEPARIN SODIUM (PORCINE) 5000 UNIT/ML IJ SOLN
5000.0000 [IU] | Freq: Three times a day (TID) | INTRAMUSCULAR | Status: DC
  Administered 2019-11-01 – 2019-11-03 (×6): 5000 [IU] via SUBCUTANEOUS
  Filled 2019-11-01 (×5): qty 1

## 2019-11-01 MED ORDER — AMLODIPINE BESYLATE 5 MG PO TABS
7.5000 mg | ORAL_TABLET | Freq: Every day | ORAL | Status: DC
  Administered 2019-11-01 – 2019-11-03 (×3): 7.5 mg via ORAL
  Filled 2019-11-01 (×3): qty 2

## 2019-11-01 MED ORDER — SODIUM CHLORIDE 0.9 % IV SOLN: INTRAVENOUS | Status: AC

## 2019-11-01 MED ORDER — SODIUM CHLORIDE 0.9 % IV BOLUS
500.0000 mL | Freq: Once | INTRAVENOUS | Status: AC
  Administered 2019-11-01: 500 mL via INTRAVENOUS

## 2019-11-01 MED ORDER — ACETAMINOPHEN 650 MG RE SUPP: 650.0000 mg | Freq: Four times a day (QID) | RECTAL | Status: DC | PRN

## 2019-11-01 MED ORDER — TRAMADOL HCL 50 MG PO TABS: 50.0000 mg | ORAL_TABLET | Freq: Four times a day (QID) | ORAL | Status: DC | PRN

## 2019-11-01 MED ORDER — ACETAMINOPHEN 325 MG PO TABS
650.0000 mg | ORAL_TABLET | Freq: Four times a day (QID) | ORAL | Status: DC | PRN
  Administered 2019-11-01 – 2019-11-02 (×2): 650 mg via ORAL
  Filled 2019-11-01 (×2): qty 2

## 2019-11-01 NOTE — ED Provider Notes (Signed)
[facility] EMERGENCY DEPARTMENT Provider Note   CSN: [id number] Arrival date & time: 11/01/19  1101     History Chief Complaint  Patient presents with  . Weakness    Toni Gomez is a 86 y.o. female with a history of HTN, hyperlipidemia and chronic kidney disease, history of rhabdomyolysis and history of cva, lives alone but has family check on her frequently (nephew Braulio Conte) who found her lying in the [place] next to her car when he found her this am.  She states she had dropped her house key and it landed beneath the car.  She knelt down to retrieve it and was unable to get back up.  She states this happened this morning. Per call to nephew in parking lot,  He reports she has had increasing weakness, worse this weak and has been very unsteady on her feet.  He speculates she probably fell this am.  When he found her she was disoriented and acting confused.   The history is provided by the patient and a relative Gust Rung, nephew).  Weakness Associated symptoms: no abdominal pain, no arthralgias, no chest pain, no dizziness, no fever, no headaches, no nausea, no shortness of breath and no vomiting        Past Medical History:  Diagnosis Date  . Elevated troponin 03/26/2018  . Hyperlipidemia   . Hypertension   . Renal disorder    chronic kidney disease  . Rhabdomyolysis   . Stroke [facility])     Patient Active Problem List   Diagnosis Date Noted  . Hyperparathyroidism, primary ([place]) 07/19/2019  . Recurrent strokes ([place]) 10/29/2018  . Hypercalcemia 10/29/2018  . Allergic conjunctivitis of both eyes 07/25/2018  . Hyperlipidemia 03/31/2018  . History of stroke 03/28/2018  . CKD (chronic kidney disease) stage 3, GFR 30-59 ml/min   . Fall   . Stroke (cerebrum) ([place])   . Abnormal EKG 03/27/2018  . Syncope and collapse 03/27/2018  . Acute encephalopathy 03/27/2018  . Altered mental status 03/26/2018  . Rhabdomyolysis 03/26/2018  . Hypertension 03/26/2018    Past  Surgical History:  Procedure Laterality Date  . BREAST SURGERY  1980   R breast removed, not cancer     OB History   No obstetric history on file.     Family History  Problem Relation Age of Onset  . Hypertension Other   . Hypertension Mother   . Stroke Mother   . Asthma Father     Social History   Tobacco Use  . Smoking status: Never Smoker  . Smokeless tobacco: Never Used  Vaping Use  . Vaping Use: Never used  Substance Use Topics  . Alcohol use: No  . Drug use: No    Home Medications Prior to Admission medications   Medication Sig Start Date End Date Taking? Authorizing Provider  acetaminophen (TYLENOL) 650 MG CR tablet Take 650 mg by mouth every 8 (eight) hours as needed for pain.    [provider]  amLODipine (NORVASC) 5 MG tablet TAKE 1 & 1/2 TABLETS BY MOUTH ONCE DAILY. 10/06/19   [name], DO  atorvastatin (LIPITOR) 40 MG tablet TAKE 1 TABLET ONCE DAILY AT 6 PM. 09/01/19   Ronnie Doss M, DO  azelastine (ASTELIN) 0.1 % nasal spray Place 1 spray into both nostrils 2 (two) times daily. 09/20/19   [name], DO  cinacalcet (SENSIPAR) 30 MG tablet Take 1 tablet (30 mg total) by mouth daily with breakfast. 08/14/19   Nida,  Marella Chimes, MD    Allergies    Patient has no known allergies.  Review of Systems   Review of Systems  Constitutional: Negative for chills and fever.  HENT: Negative for congestion and sore throat.   Eyes: Negative.   Respiratory: Negative for chest tightness and shortness of breath.   Cardiovascular: Positive for leg swelling. Negative for chest pain.       Leg swelling chronic  Gastrointestinal: Negative for abdominal pain, nausea and vomiting.  Genitourinary: Negative.   Musculoskeletal: Negative for arthralgias, joint swelling and neck pain.  Skin: Negative.  Negative for rash and wound.  Neurological: Positive for weakness. Negative for dizziness, light-headedness, numbness and headaches.    Psychiatric/Behavioral: Negative.     Physical Exam Updated Vital Signs BP (!) 180/79 (BP Location: Left Arm)   Pulse 69   Temp 98.5 F (36.9 C) (Oral)   Resp 18   Ht 5\' 1"  (1.549 m)   Wt 59 kg   SpO2 100%   BMI 24.56 kg/m   Physical Exam Vitals and nursing note reviewed.  Constitutional:      General: She is not in acute distress.    Appearance: She is well-developed.  HENT:     Head: Normocephalic and atraumatic.  Eyes:     Conjunctiva/sclera: Conjunctivae normal.  Cardiovascular:     Rate and Rhythm: Normal rate and regular rhythm.     Heart sounds: Normal heart sounds.  Pulmonary:     Effort: Pulmonary effort is normal.     Breath sounds: Normal breath sounds. No wheezing.  Abdominal:     General: Bowel sounds are normal.     Palpations: Abdomen is soft.     Tenderness: There is no abdominal tenderness.  Musculoskeletal:        General: Normal range of motion.     Cervical back: Normal range of motion.     Right lower leg: Edema present.     Left lower leg: Edema present.  Skin:    General: Skin is warm and dry.  Neurological:     General: No focal deficit present.     Mental Status: She is alert and oriented to person, place, and time.     Sensory: No sensory deficit.     Motor: Weakness present.  Psychiatric:        Mood and Affect: Mood normal.     ED Results / Procedures / Treatments   Labs (all labs ordered are listed, but only abnormal results are displayed) Labs Reviewed  CBC WITH DIFFERENTIAL/PLATELET - Abnormal; Notable for the following components:      Result Value   RDW 15.9 (*)    Neutro Abs 7.9 (*)    All other components within normal limits  CK - Abnormal; Notable for the following components:   Total CK 3,208 (*)    All other components within normal limits  COMPREHENSIVE METABOLIC PANEL - Abnormal; Notable for the following components:   Glucose, Bld 106 (*)    BUN 24 (*)    Creatinine, Ser 1.17 (*)    Calcium 10.4 (*)     AST 74 (*)    Total Bilirubin 1.4 (*)    GFR calc non Af Amer 40 (*)    GFR calc Af Amer 46 (*)    All other components within normal limits  CBG MONITORING, ED - Abnormal; Notable for the following components:   Glucose-Capillary 103 (*)    All other components within normal limits  SARS CORONAVIRUS 2 BY RT PCR (HOSPITAL ORDER, [place] [facility])  URINALYSIS, ROUTINE W REFLEX MICROSCOPIC    EKG None  Radiology No results found.  Procedures Procedures (including critical care time)  Medications Ordered in ED Medications  sodium chloride 0.9 % bolus 500 mL (has no administration in time range)    ED Course  I have reviewed the triage vital signs and the nursing notes.  Pertinent labs & imaging results that were available during my care of the patient were reviewed by me and considered in my medical decision making (see chart for details).    MDM Rules/Calculators/A&P                          Pt with increasing weakness found down this am with elevated CK indicating probable early rhabdomyolysis.  She denies fall, but was weak and unable to get off the pavement.  It is unclear how long she was lying outside.   Discussed with Dr. Denton Brick who accepts pt for admission.  Final Clinical Impression(s) / ED Diagnoses Final diagnoses:  Weakness  Non-traumatic rhabdomyolysis    Rx / DC Orders ED Discharge Orders    None       Landis Martins 11/01/19 1628    Elnora Morrison, MD 11/03/19 1726

## 2019-11-01 NOTE — ED Triage Notes (Signed)
Per EMS- Pt nephew called EMS after finding pt on the ground outside of pt house. Pt lives alone. Pt reports dropping car keys under car and could not get up after retrieving keys. Denies fall. Pt nephew states pt has been increasingly weak over past few days. Decreased appetite.   Upon arrival to ED, pt has bilateral lower extremity swelling. Pt states this is common.

## 2019-11-01 NOTE — ED Notes (Signed)
Pt moved from H6 to rm 16. Provided comfort measures and placed on pure wick due to weakness.

## 2019-11-01 NOTE — H&P (Signed)
History and Physical    Toni Gomez JOA:[id number] DOB: 04-02-1924 DOA: 11/01/2019  PCP: [name], DO   Patient coming from: Home  I have personally briefly reviewed patient's old medical records in [place]  Chief Complaint: Fall  HPI: Toni Gomez is a 86 y.o. female with medical history significant for hypertension, syncope, CKD 3, stroke. Patient was brought to the ED with reports of a fall.  On the time of my evaluation patient is awake alert and able to give me detailed history.  She does not appear confused, unable to give me the phone number of her nephew off hand. She tells me today her key fell from her hand and rolled on the car, she bent down but she could not reach to key with her hand, so she laid down on the ground on her back trying to reach the key,  She did not think to get a broom handle to the reach to key.  She was then unable to get up off the ground, and that was when her nephew saw her, he usually checks on her.  Patient reports she was also incontinent of urine.  See patient on the floor, nephew insisted on calling EMS, though patient reported she was okay. Patient denies any pain with urination, or urinary problems.  No cough, no difficulty breathing.  She reports intermittent dizziness when standing up, but this is not new.  She reports chronic poor p.o. intake, and increasing generalized weakness.  She denies focal weakness of her extremities.  Patient ambulates with a walker but tells me she is mostly in a wheelchair.  Talked to patient's nephew Toni Gomez on the phone, patient lives alone and still drives, he is unaware how long patient was on the floor.  He and his sister live down the road from patient and check on patient 3 times a day at least.  He also reports increasing lower extremity weakness in his aunt, and is afraid patient will fall.  ED Course: Blood pressure systolic 601U to 932T otherwise stable vitals.  CK elevated at  3208.  Creatinine 1.17 at baseline.  EKG shows sinus rhythm with right bundle branch block, QTC 499 without significant change from prior.  549mls bolus Given.  Hospitalist admit for rhabdomyolysis, generalized weakness.  Review of Systems: As per HPI all other systems reviewed and negative.  Past Medical History:  Diagnosis Date  . Elevated troponin 03/26/2018  . Hyperlipidemia   . Hypertension   . Renal disorder    chronic kidney disease  . Rhabdomyolysis   . Stroke [facility]     Past Surgical History:  Procedure Laterality Date  . BREAST SURGERY  1980   R breast removed, not cancer     reports that she has never smoked. She has never used smokeless tobacco. She reports that she does not drink alcohol and does not use drugs.  No Known Allergies  Family History  Problem Relation Age of Onset  . Hypertension Other   . Hypertension Mother   . Stroke Mother   . Asthma Father     Prior to Admission medications   Medication Sig Start Date End Date Taking? Authorizing Provider  acetaminophen (TYLENOL) 650 MG CR tablet Take 650 mg by mouth every 8 (eight) hours as needed for pain.    [provider]  amLODipine (NORVASC) 5 MG tablet TAKE 1 & 1/2 TABLETS BY MOUTH ONCE DAILY. 10/06/19   [name], DO  atorvastatin (LIPITOR) 40 MG tablet TAKE 1 TABLET ONCE DAILY AT 6 PM. 09/01/19   Ronnie Doss M, DO  azelastine (ASTELIN) 0.1 % nasal spray Place 1 spray into both nostrils 2 (two) times daily. 09/20/19   [name], DO  cinacalcet (SENSIPAR) 30 MG tablet Take 1 tablet (30 mg total) by mouth daily with breakfast. 08/14/19   Cassandria Anger, MD    Physical Exam: Vitals:   11/01/19 1112 11/01/19 1745  BP: (!) 180/79 (!) 171/82  Pulse: 69 92  Resp: 18 18  Temp: 98.5 F (36.9 C)   TempSrc: Oral   SpO2: 100% 100%  Weight: 59 kg   Height: 5\' 1"  (1.549 m)     Constitutional: NAD, calm, comfortable Vitals:   11/01/19 1112 11/01/19 1745  BP: (!)  180/79 (!) 171/82  Pulse: 69 92  Resp: 18 18  Temp: 98.5 F (36.9 C)   TempSrc: Oral   SpO2: 100% 100%  Weight: 59 kg   Height: 5\' 1"  (1.549 m)    Eyes: PERRL, lids and conjunctivae normal ENMT: Mucous membranes are moist.  Neck: normal, supple, no masses, no thyromegaly Respiratory: clear to auscultation bilaterally, no wheezing, no crackles. Normal respiratory effort. No accessory muscle use.  Cardiovascular: Regular rate and rhythm, no murmurs / rubs / gallops. No extremity edema. 2+ pedal pulses.  Abdomen: no tenderness, no masses palpated. No hepatosplenomegaly. Bowel sounds positive.  Musculoskeletal: no clubbing / cyanosis. No joint deformity upper and lower extremities. Good ROM, no contractures. Normal muscle tone.  Right lower extremity appears longer than the left.  Patient states this is normal for her. Skin: no rashes, lesions, ulcers. No induration Neurologic: 4/5 strength in all extremities, but reports pain in thighs L > R, on attempting to raise legs above gravity, no facial asymmetry, speech is fluent.  Sensation intact globally. Psychiatric: Normal judgment and insight. Alert and oriented x 3. Normal mood.   Labs on Admission: I have personally reviewed following labs and imaging studies  CBC: Recent Labs  Lab 11/01/19 1246  WBC 9.5  NEUTROABS 7.9*  HGB 13.0  HCT 40.2  MCV 83.8  PLT 845   Basic Metabolic Panel: Recent Labs  Lab 11/01/19 1246 11/01/19 1247  NA 140  --   K 4.5  --   CL 105  --   CO2 24  --   GLUCOSE 106*  --   BUN 24*  --   CREATININE 1.17*  --   CALCIUM 10.4*  --   MG  --  2.2   Liver Function Tests: Recent Labs  Lab 11/01/19 1246  AST 74*  ALT 24  ALKPHOS 77  BILITOT 1.4*  PROT 7.0  ALBUMIN 3.9   Cardiac Enzymes: Recent Labs  Lab 11/01/19 1246  CKTOTAL 3,208*   CBG: Recent Labs  Lab 11/01/19 1250  GLUCAP 103*    Radiological Exams on Admission: DG Pelvis Portable  Result Date: 11/01/2019 CLINICAL DATA:   Found down EXAM: PORTABLE PELVIS 1-2 VIEWS COMPARISON:  None FINDINGS: Patient is rotated in a left anterior oblique flu as well as external rotation of the left hip superimposing portion of the greater trochanter of the femoral neck. These features may limit detection of subtle anomalies. No gross pelvic diastasis or visible pelvic bone fracture. Proximal femora appear intact and normally located with left greater than right joint space narrowing and periacetabular spurring. Enthesopathic changes noted about the pelvis and hips. Multilevel degenerative changes included portions of the lumbar  spine and lumbosacral junction. Additional arthrosis of the SI joints and symphysis pubis. Phleboliths in the pelvis. Vascular calcium in the low abdomen and pelvis as well. Some left lateral hip swelling is noted. IMPRESSION: 1. Rotated left hip radiograph with pelvis imaged in obliquity and left hip in external rotation superimposing portion of the greater trochanter of the left femoral neck. These features may limit detection of subtle anomalies. 2. Mild left hip swelling but without gross pelvic diastasis or visible pelvic or proximal femoral fracture. If there is persisting concern, cross-sectional imaging could be obtained. 3. Degenerative changes in the spine, SI joints and symphysis pubis. Electronically Signed   By: Lovena Le M.D.   On: 11/01/2019 17:44    EKG: Independently reviewed.  Artifacts, will repeat, otherwise Sinus rhythm, QTC 499, no significant changes from prior.  Assessment/Plan Principal Problem:   Rhabdomyolysis Active Problems:   Hypertension   CKD (chronic kidney disease) stage 3, GFR 30-59 ml/min   Fall  Rhabdomyolysis-CK 3208, probably related to the events of today, but patient is also on statins.  Renal function stable creatinine 1.17 at baseline. -500 mill bolus given, continue N/s 75cc/hr x20hrs -Trend CK -Hold statins  Reported fall, generalized weakness- patient was found  on the floor, but she denies fall, she did not hit her head or lose consciousness.  She is awake alert oriented.  Reports chronic intermittent orthostatic dizziness.  Ambulates with wheelchair, walker.  Weakness likely 2/2 increasing facility with advanced age. -Portable pelvic x-ray limited view, but no acute abnormality appreciated. -PT evaluation -Check TSH -Occasional urinary incontinence, UA pending  Hypertension-elevated. -Norvasc.  CKD 3-creatinine 1.17, baseline 1.2-1.4. - IVF  DVT prophylaxis: Heparin Code Status: Full code, confirmed with patient's nephew Toni Gomez on the phone. Family Communication: None at bedside.  Talk to patient's nephew on the phone- Toni Gomez who is his primary decision maker, along with his sister Toni Gomez.  Disposition Plan: 1 to 2 days. Consults called: None Admission status: Observation, telemetry   Bethena Roys MD [facility]  11/01/2019, 6:51 PM

## 2019-11-01 NOTE — Plan of Care (Signed)

## 2019-11-01 NOTE — ED Notes (Signed)
Per P.A. Idol, patient was given graham crackers and ginger ale.

## 2019-11-02 DIAGNOSIS — M6282 Rhabdomyolysis: Secondary | ICD-10-CM | POA: Diagnosis not present

## 2019-11-02 DIAGNOSIS — W19XXXA Unspecified fall, initial encounter: Secondary | ICD-10-CM | POA: Diagnosis not present

## 2019-11-02 DIAGNOSIS — N1832 Chronic kidney disease, stage 3b: Secondary | ICD-10-CM | POA: Diagnosis not present

## 2019-11-02 DIAGNOSIS — I10 Essential (primary) hypertension: Secondary | ICD-10-CM | POA: Diagnosis not present

## 2019-11-02 LAB — CK: Total CK: 1438 U/L — ABNORMAL HIGH (ref 38–234)

## 2019-11-02 LAB — URINALYSIS, ROUTINE W REFLEX MICROSCOPIC
Bacteria, UA: NONE SEEN
Bilirubin Urine: NEGATIVE
Glucose, UA: NEGATIVE mg/dL
Ketones, ur: NEGATIVE mg/dL
Leukocytes,Ua: NEGATIVE
Nitrite: NEGATIVE
Protein, ur: 100 mg/dL — AB
Specific Gravity, Urine: 1.012 (ref 1.005–1.030)
pH: 7 (ref 5.0–8.0)

## 2019-11-02 MED ORDER — SODIUM CHLORIDE 0.9 % IV SOLN: INTRAVENOUS | Status: DC

## 2019-11-02 NOTE — TOC Initial Note (Signed)
Transition of Care [facility]) - Initial/Assessment Note    Patient Details  Name: Toni Gomez MRN: [id number] Date of Birth: [birth year]  [facility] of [facility] CM/SW Contact:    Boneta Lucks, RN Phone Number: 11/02/2019, 1:26 PM  Clinical Narrative:     Patient admitted with rhabdomyolysis. Patient lives at home with her nephew. PT eval for SNF unless family has 24/7 care and can mod assist.  TOC spoke with Braulio Conte her nephew, he is requesting HHPT and states he will be with her 24/7.  Patient has [employer]. Georgina Snell with [facility] accepted the referral.               Expected Discharge Plan: [place] Barriers to Discharge: Continued Medical Work up   Patient Goals and CMS Choice Patient states their goals for this hospitalization and ongoing recovery are:: to go home. CMS Medicare.gov Compare Post Acute Care list provided to:: Patient Represenative (must comment) Choice offered to / list presented to : Adult Children  Expected Discharge Plan and Services Expected Discharge Plan: [place] Choice: [place] arrangements for the past 2 months: Single Family Home                    HH Arranged: PT HH Agency: [place] Date [place]: 11/02/19 Time HH Agency Contacted: 1320 Representative spoke with at [place]: Georgina Snell  Prior Living Arrangements/Services Living arrangements for the past 2 months: [place] Lives with:: Relatives   Do you feel safe going back to the place where you live?: Yes      Need for Family Participation in Patient Care: Yes (Comment) Care giver support system in place?: Yes (comment)   Criminal Activity/Legal Involvement Pertinent to Current Situation/Hospitalization: No - Comment as needed  Activities of Daily Living Home Assistive Devices/Equipment: Environmental consultant (specify type), Wheelchair ADL Screening (condition at time of admission) Patient's  cognitive ability adequate to safely complete daily activities?: Yes Is the patient deaf or have difficulty hearing?: Yes Does the patient have difficulty seeing, even when wearing glasses/contacts?: Yes Does the patient have difficulty concentrating, remembering, or making decisions?: No Patient able to express need for assistance with ADLs?: Yes Does the patient have difficulty dressing or bathing?: No Independently performs ADLs?: Yes (appropriate for developmental age) Does the patient have difficulty walking or climbing stairs?: No Weakness of Legs: Both Weakness of Arms/Hands: None  Permission Sought/Granted      Share Information with NAME: Edwinna Rochette     Permission granted to share info w Relationship: nephew     Emotional Assessment      Alcohol / Substance Use: Not Applicable Psych Involvement: No (comment)  Admission diagnosis:  Weakness [R53.1] Fall [W19.XXXA] Non-traumatic rhabdomyolysis [M62.82] Patient Active Problem List   Diagnosis Date Noted  . Hyperparathyroidism, primary ([place]) 07/19/2019  . Recurrent strokes ([place]) 10/29/2018  . Hypercalcemia 10/29/2018  . Allergic conjunctivitis of both eyes 07/25/2018  . Hyperlipidemia 03/31/2018  . History of stroke 03/28/2018  . CKD (chronic kidney disease) stage 3, GFR 30-59 ml/min   . Fall   . Stroke (cerebrum) ([place])   . Abnormal EKG 03/27/2018  . Syncope and collapse 03/27/2018  . Acute encephalopathy 03/27/2018  . Altered mental status 03/26/2018  . Rhabdomyolysis 03/26/2018  . Hypertension 03/26/2018   PCP:  [name], DO Pharmacy:   [place], [place]  [place] [address] Phone: [phone] Fax: [phone]

## 2019-11-02 NOTE — Care Management Obs Status (Signed)
[place] NOTIFICATION   Patient Details  Name: Toni Gomez MRN: [id number] Date of Birth: [birth year]   Medicare Observation Status Notification Given:  Yes    Tommy Medal 11/02/2019, 4:31 PM

## 2019-11-02 NOTE — Progress Notes (Signed)
Patient Demographics:    Toni Gomez, is a 86 y.o. female, DOB - Oct 12, 1923, TFT:[id number]  Admit date - 11/01/2019   Admitting Physician Ejiroghene Arlyce Dice, MD  Outpatient Primary MD for the patient is [name], DO  LOS - 0   Chief Complaint  Patient presents with  . Weakness        Subjective:    Toni Gomez today has no fevers, no emesis,  No chest pain,  \ --Patient is very weak, significant mobility related challenges persist -[place] Crane at bedside  Assessment  & Plan :    Principal Problem:   Rhabdomyolysis Active Problems:   Hypertension   CKD (chronic kidney disease) stage 3, GFR 30-59 ml/min   Fall   Rhabdomyolysis-CK 3208 >>>> 1,438,  -Continue IV fluids continue to hold statin  Reported fall, generalized weakness- no acute fractures, PT recommends SNF rehab  -Patient's nephew states he may be able to manage at home with home health services  -We will reevaluate in a.m after additional hydration. and decide if patient is to go to SNF versus home with home health  Hypertension--stable continue-Norvasc.  CKD 3-creatinine 1.17, baseline 1.2-1.4.   renally adjust medications, avoid nephrotoxic agents / dehydration  / hypotension -Continue IV fluids especially in view of elevated CKs   Disposition: The patient is from: Home              Anticipated d/c is to: Home with home health versus SNF rehab              Anticipated d/c date is: 2 days              Patient currently is not medically stable to d/c. Barriers: Not Clinically Stable-very weak, unsteady-- needs additional iv hydration on reevaluation may be able to go home with home health with nephew otherwise SNF rehab in a.m.  Code Status : full  Family Communication:     (patient is alert, awake and coherent) --nephew at bedside  Consults  :  na  DVT Prophylaxis  :   - Heparin - SCDs   Lab  Results  Component Value Date   PLT 221 11/01/2019    Inpatient Medi0cations  Scheduled Meds: . amLODipine  7.5 mg Oral Daily  . heparin  5,000 Units Subcutaneous Q8H   Continuous Infusions: . sodium chloride     PRN Meds:.acetaminophen **OR** acetaminophen, traMADol    Anti-infectives (From admission, onward)   None        Objective:   Vitals:   11/01/19 2108 11/02/19 0045 11/02/19 0450 11/02/19 1514  BP: (!) 170/78 (!) 121/52 (!) 124/49 (!) 124/49  Pulse: 78 65 70 60  Resp: 16 16 16 17   Temp: 97.8 F (36.6 C) 98.7 F (37.1 C) 97.7 F (36.5 C) (!) 97.4 F (36.3 C)  TempSrc: Oral Oral Oral Oral  SpO2: 100% 100% 99% 100%  Weight:      Height:        Wt Readings from Last 3 Encounters:  11/01/19 55.6 kg  09/20/19 58.1 kg  08/14/19 58.8 kg     Intake/Output Summary (Last 24 hours) at 11/02/2019 1757 Last data filed at 11/02/2019 1400 Gross per 24 hour  Intake 1410.72 ml  Output 501 ml  Net 909.72 ml     Physical Exam  Gen:- Awake Alert, in no apparent distress  HEENT:- [place].AT, No sclera icterus Neck-Supple Neck,No JVD,.  Lungs-  CTAB , fair symmetrical air movement CV- S1, S2 normal, regular  Abd-  +ve B.Sounds, Abd Soft, No tenderness,    Extremity/Skin:- No  edema, pedal pulses present  Psych-affect is appropriate, oriented x3 Neuro-generalized weakness, no new focal deficits, no tremors   Data Review:   Micro Results Recent Results (from the past 240 hour(s))  SARS Coronavirus 2 by RT PCR (hospital order, performed in [facility] hospital lab) Nasopharyngeal Nasopharyngeal Swab     Status: None   Collection Time: 11/01/19  4:26 PM   Specimen: Nasopharyngeal Swab  Result Value Ref Range Status   SARS Coronavirus 2 NEGATIVE NEGATIVE Final    Comment: (NOTE) SARS-CoV-2 target nucleic acids are NOT DETECTED.  The SARS-CoV-2 RNA is generally detectable in upper and lower respiratory specimens during the acute phase of infection. The  lowest concentration of SARS-CoV-2 viral copies this assay can detect is 250 copies / mL. A negative result does not preclude SARS-CoV-2 infection and should not be used as the sole basis for treatment or other patient management decisions.  A negative result may occur with improper specimen collection / handling, submission of specimen other than nasopharyngeal swab, presence of viral mutation(s) within the areas targeted by this assay, and inadequate number of viral copies (<250 copies / mL). A negative result must be combined with clinical observations, patient history, and epidemiological information.  Fact Sheet for Patients:   StrictlyIdeas.no  Fact Sheet for Healthcare Providers: BankingDealers.co.za  This test is not yet approved or  cleared by the [place] FDA and has been authorized for detection and/or diagnosis of SARS-CoV-2 by FDA under an Emergency Use Authorization (EUA).  This EUA will remain in effect (meaning this test can be used) for the duration of the COVID-19 declaration under Section 564(b)(1) of the Act, 21 U.S.C. section 360bbb-3(b)(1), unless the authorization is terminated or revoked sooner.  Performed at [facility] [address] [place],  [address]     Radiology Reports DG Pelvis Portable  Result Date: 11/01/2019 CLINICAL DATA:  Found down EXAM: PORTABLE PELVIS 1-2 VIEWS COMPARISON:  None FINDINGS: Patient is rotated in a left anterior oblique flu as well as external rotation of the left hip superimposing portion of the greater trochanter of the femoral neck. These features may limit detection of subtle anomalies. No gross pelvic diastasis or visible pelvic bone fracture. Proximal femora appear intact and normally located with left greater than right joint space narrowing and periacetabular spurring. Enthesopathic changes noted about the pelvis and hips. Multilevel degenerative changes included  portions of the lumbar spine and lumbosacral junction. Additional arthrosis of the SI joints and symphysis pubis. Phleboliths in the pelvis. Vascular calcium in the low abdomen and pelvis as well. Some left lateral hip swelling is noted. IMPRESSION: 1. Rotated left hip radiograph with pelvis imaged in obliquity and left hip in external rotation superimposing portion of the greater trochanter of the left femoral neck. These features may limit detection of subtle anomalies. 2. Mild left hip swelling but without gross pelvic diastasis or visible pelvic or proximal femoral fracture. If there is persisting concern, cross-sectional imaging could be obtained. 3. Degenerative changes in the spine, SI joints and symphysis pubis. Electronically Signed   By: Lovena Le M.D.   On: 11/01/2019 17:44  CBC Recent Labs  Lab 11/01/19 1246  WBC 9.5  HGB 13.0  HCT 40.2  PLT 221  MCV 83.8  MCH 27.1  MCHC 32.3  RDW 15.9*  LYMPHSABS 0.9  MONOABS 0.7  EOSABS 0.0  BASOSABS 0.0    Chemistries  Recent Labs  Lab 11/01/19 1246 11/01/19 1247  NA 140  --   K 4.5  --   CL 105  --   CO2 24  --   GLUCOSE 106*  --   BUN 24*  --   CREATININE 1.17*  --   CALCIUM 10.4*  --   MG  --  2.2  AST 74*  --   ALT 24  --   ALKPHOS 77  --   BILITOT 1.4*  --    ------------------------------------------------------------------------------------------------------------------ No results for input(s): CHOL, HDL, LDLCALC, TRIG, CHOLHDL, LDLDIRECT in the last 72 hours.  Lab Results  Component Value Date   HGBA1C 5.4 10/30/2018   ------------------------------------------------------------------------------------------------------------------ Recent Labs    11/01/19 1255  TSH 1.267   ------------------------------------------------------------------------------------------------------------------ No results for input(s): VITAMINB12, FOLATE, FERRITIN, TIBC, IRON, RETICCTPCT in the last 72 hours.  Coagulation  profile No results for input(s): INR, PROTIME in the last 168 hours.  No results for input(s): DDIMER in the last 72 hours.  Cardiac Enzymes No results for input(s): CKMB, TROPONINI, MYOGLOBIN in the last 168 hours.  Invalid input(s): CK ------------------------------------------------------------------------------------------------------------------    Component Value Date/Time   BNP 455.0 (H) 03/26/2018 2016     Roxan Hockey M.D on 11/02/2019 at 5:57 PM  Go to [email or web address] - for contact info  [facility] - Office  [phone]

## 2019-11-02 NOTE — Evaluation (Signed)
Physical Therapy Evaluation Patient Details Name: Toni Gomez MRN: [id number] DOB: Apr 28, 1924 Today's Date: 11/02/2019   History of Present Illness  Toni Gomez is a 86 y.o. female with medical history significant for hypertension, syncope, CKD 3, stroke.Patient was brought to the ED with reports of a fall.  On the time of my evaluation patient is awake alert and able to give me detailed history.  She does not appear confused, unable to give me the phone number of her nephew off hand.She tells me today her key fell from her hand and rolled on the car, she bent down but she could not reach to key with her hand, so she laid down on the ground on her back trying to reach the key,  She did not think to get a broom handle to the reach to key.  She was then unable to get up off the ground, and that was when her nephew saw her, he usually checks on her.  Patient reports she was also incontinent of urine.  See patient on the floor, nephew insisted on calling EMS, though patient reported she was okay.Patient denies any pain with urination, or urinary problems.  No cough, no difficulty breathing.  She reports intermittent dizziness when standing up, but this is not new.  She reports chronic poor p.o. intake, and increasing generalized weakness.  She denies focal weakness of her extremities.  Patient ambulates with a walker but tells me she is mostly in a wheelchair.    Clinical Impression  Patient demonstrates slow labored movement for sitting up at bedside, has difficulty with sit to stands due to BLE weakness and posterior leaning, once on feet able to ambulate in room/hallway without loss of balance, limited secondary to fatigue and tolerated siting up in chair after therapy - RN notified.  Patient will benefit from continued physical therapy in hospital and recommended venue below to increase strength, balance, endurance for safe ADLs and gait.     Follow Up Recommendations SNF;Supervision for  mobility/OOB;Supervision/Assistance - 24 hour    Equipment Recommendations  None recommended by PT    Recommendations for Other Services       Precautions / Restrictions Precautions Precautions: Fall Restrictions Weight Bearing Restrictions: No      Mobility  Bed Mobility Overal bed mobility: Needs Assistance Bed Mobility: Supine to Sit     Supine to sit: Min assist;Mod assist     General bed mobility comments: increased time, labored movement  Transfers Overall transfer level: Needs assistance Equipment used: 1 person hand held assist Transfers: Sit to/from [facility] Sit to Stand: Mod assist Stand pivot transfers: Min assist       General transfer comment: had difficulty completing sit to stands due to BLE weakness and posterior leaning  Ambulation/Gait Ambulation/Gait assistance: Min assist Gait Distance (Feet): 50 Feet Assistive device: Rolling walker (2 wheeled) Gait Pattern/deviations: Decreased step length - right;Decreased step length - left;Decreased stride length Gait velocity: decreased   General Gait Details: slow labored cadence without loss of balance, limited secondary to c/o fatigue  Stairs            Wheelchair Mobility    Modified Rankin (Stroke Patients Only)       Balance Overall balance assessment: Needs assistance Sitting-balance support: Feet supported;No upper extremity supported Sitting balance-Leahy Scale: Fair Sitting balance - Comments: fair/good seated at EOB   Standing balance support: During functional activity;Bilateral upper extremity supported Standing balance-Leahy Scale: Fair Standing balance comment: using RW  Pertinent Vitals/Pain Pain Assessment: Faces Faces Pain Scale: Hurts a little bit Pain Location: BLE Pain Descriptors / Indicators: Sore;Discomfort Pain Intervention(s): Limited activity within patient's tolerance;Monitored during session     [place] expects to be discharged to:: Private residence Living Arrangements: Alone Available Help at Discharge: Family;Available PRN/intermittently Type of Home: House Home Access: Ramped entrance     Home Layout: One level Home Equipment: Walker - 2 wheels;Shower seat;Bedside commode;Wheelchair - manual;Cane - single point      Prior Function Level of Independence: Needs assistance   Gait / Transfers Assistance Needed: household and short distanced community ambulator using RW, drives, uses wheelchair in house also  ADL's / Homemaking Assistance Needed: assisted by family        Hand Dominance   Dominant Hand: Right    Extremity/Trunk Assessment   Upper Extremity Assessment Upper Extremity Assessment: Generalized weakness    Lower Extremity Assessment Lower Extremity Assessment: Generalized weakness    Cervical / Trunk Assessment Cervical / Trunk Assessment: Normal  Communication   Communication: HOH  Cognition Arousal/Alertness: Awake/alert Behavior During Therapy: WFL for tasks assessed/performed Overall Cognitive Status: Within Functional Limits for tasks assessed                                        General Comments      Exercises     Assessment/Plan    PT Assessment Patient needs continued PT services  PT Problem List Decreased strength;Decreased activity tolerance;Decreased balance;Decreased mobility       PT Treatment Interventions Gait training;Functional mobility training;Stair training;Therapeutic activities;Therapeutic exercise;Patient/family education    PT Goals (Current goals can be found in the Care Plan section)  Acute Rehab PT Goals Patient Stated Goal: return home with family to assist PT Goal Formulation: With patient Time For Goal Achievement: 11/16/19 Potential to Achieve Goals: Good    Frequency Min 3X/week   Barriers to discharge        Co-evaluation               AM-PAC  PT "6 Clicks" Mobility  Outcome Measure Help needed turning from your back to your side while in a flat bed without using bedrails?: A Little Help needed moving from lying on your back to sitting on the side of a flat bed without using bedrails?: A Lot Help needed moving to and from a bed to a chair (including a wheelchair)?: A Little Help needed standing up from a chair using your arms (e.g., wheelchair or bedside chair)?: A Lot Help needed to walk in hospital room?: A Little Help needed climbing 3-5 steps with a railing? : A Lot 6 Click Score: 15    End of Session   Activity Tolerance: Patient tolerated treatment well;Patient limited by fatigue Patient left: in chair;with call bell/phone within reach Nurse Communication: Mobility status PT Visit Diagnosis: Unsteadiness on feet (R26.81);Other abnormalities of gait and mobility (R26.89);Muscle weakness (generalized) (M62.81)    Time: 5638-9373 PT Time Calculation (min) (ACUTE ONLY): 32 min   Charges:   PT Evaluation $PT Eval Moderate Complexity: 1 Mod PT Treatments $Therapeutic Activity: 23-37 mins        12:23 PM, 11/02/19 Lonell Grandchild, MPT Physical Therapist with [facility] [phone] office [phone] mobile phone

## 2019-11-02 NOTE — Plan of Care (Signed)
  Problem: Acute Rehab PT Goals(only PT should resolve) Goal: Pt Will Go Supine/Side To Sit Outcome: Progressing Flowsheets (Taken 11/02/2019 1224) Pt will go Supine/Side to Sit: with min guard assist Goal: Patient Will Transfer Sit To/From Stand Outcome: Progressing Flowsheets (Taken 11/02/2019 1224) Patient will transfer sit to/from stand: with minimal assist Goal: Pt Will Transfer Bed To Chair/Chair To Bed Outcome: Progressing Flowsheets (Taken 11/02/2019 1224) Pt will Transfer Bed to Chair/Chair to Bed: min guard assist Goal: Pt Will Ambulate Outcome: Progressing Flowsheets (Taken 11/02/2019 1224) Pt will Ambulate:  75 feet  with min guard assist  with rolling walker   12:25 PM, 11/02/19 Lonell Grandchild, MPT Physical Therapist with [facility] [phone] office [phone] mobile phone

## 2019-11-03 DIAGNOSIS — N1832 Chronic kidney disease, stage 3b: Secondary | ICD-10-CM

## 2019-11-03 DIAGNOSIS — I10 Essential (primary) hypertension: Secondary | ICD-10-CM

## 2019-11-03 DIAGNOSIS — M6282 Rhabdomyolysis: Secondary | ICD-10-CM | POA: Diagnosis not present

## 2019-11-03 DIAGNOSIS — W19XXXA Unspecified fall, initial encounter: Secondary | ICD-10-CM | POA: Diagnosis not present

## 2019-11-03 LAB — RENAL FUNCTION PANEL
Albumin: 2.9 g/dL — ABNORMAL LOW (ref 3.5–5.0)
Anion gap: 9 (ref 5–15)
BUN: 21 mg/dL (ref 8–23)
CO2: 25 mmol/L (ref 22–32)
Calcium: 9.5 mg/dL (ref 8.9–10.3)
Chloride: 108 mmol/L (ref 98–111)
Creatinine, Ser: 1.2 mg/dL — ABNORMAL HIGH (ref 0.44–1.00)
GFR calc Af Amer: 45 mL/min — ABNORMAL LOW (ref >60)
GFR calc non Af Amer: 39 mL/min — ABNORMAL LOW (ref >60)
Glucose, Bld: 84 mg/dL (ref 70–99)
Phosphorus: 2.8 mg/dL (ref 2.5–4.6)
Potassium: 3.6 mmol/L (ref 3.5–5.1)
Sodium: 142 mmol/L (ref 135–145)

## 2019-11-03 LAB — CBC
HCT: 36.4 % (ref 36.0–46.0)
Hemoglobin: 11.4 g/dL — ABNORMAL LOW (ref 12.0–15.0)
MCH: 26.9 pg (ref 26.0–34.0)
MCHC: 31.3 g/dL (ref 30.0–36.0)
MCV: 85.8 fL (ref 80.0–100.0)
Platelets: 233 10*3/uL (ref 150–400)
RBC: 4.24 MIL/uL (ref 3.87–5.11)
RDW: 16.3 % — ABNORMAL HIGH (ref 11.5–15.5)
WBC: 5.8 10*3/uL (ref 4.0–10.5)
nRBC: 0 % (ref 0.0–0.2)

## 2019-11-03 LAB — CK: Total CK: 689 U/L — ABNORMAL HIGH (ref 38–234)

## 2019-11-03 MED ORDER — ATORVASTATIN CALCIUM 40 MG PO TABS: 20.0000 mg | ORAL_TABLET | Freq: Every evening | ORAL | 0 refills | Status: DC

## 2019-11-03 MED ORDER — ASPIRIN EC 81 MG PO TBEC: 81.0000 mg | DELAYED_RELEASE_TABLET | Freq: Every day | ORAL | 11 refills | Status: AC

## 2019-11-03 MED ORDER — ACETAMINOPHEN 325 MG PO TABS: 650.0000 mg | ORAL_TABLET | Freq: Four times a day (QID) | ORAL | 0 refills | Status: DC | PRN

## 2019-11-03 MED ORDER — AMLODIPINE BESYLATE 5 MG PO TABS: 5.0000 mg | ORAL_TABLET | Freq: Every day | ORAL | 5 refills | Status: DC

## 2019-11-03 NOTE — Progress Notes (Signed)
Nsg Discharge Note  Admit Date:  11/01/2019 Discharge date: 11/03/2019   Toni Gomez to be D/C'd Home per MD order.  AVS completed.  Copy for chart, and copy for patient signed, and dated. Patient/caregiver able to verbalize understanding.  Discharge Medication: Allergies as of 11/03/2019   No Known Allergies     Medication List    TAKE these medications   acetaminophen 325 MG tablet Commonly known as: TYLENOL Take 2 tablets (650 mg total) by mouth every 6 (six) hours as needed for mild pain (or Fever >/= 101).   amLODipine 5 MG tablet Commonly known as: NORVASC Take 1 tablet (5 mg total) by mouth daily. What changed: how much to take   aspirin EC 81 MG tablet Take 1 tablet (81 mg total) by mouth daily with breakfast. Swallow whole. What changed: when to take this   atorvastatin 40 MG tablet Commonly known as: LIPITOR Take 0.5 tablets (20 mg total) by mouth every evening. What changed: See the new instructions.   azelastine 0.1 % nasal spray Commonly known as: ASTELIN Place 1 spray into both nostrils 2 (two) times daily.       Discharge Assessment: Vitals:   11/03/19 0848 11/03/19 1341  BP: (!) 132/53 126/66  Pulse:  76  Resp:  18  Temp:  98.4 F (36.9 C)  SpO2:  100%   Skin clean, dry and intact without evidence of skin break down, no evidence of skin tears noted. IV catheter discontinued intact. Site without signs and symptoms of complications - no redness or edema noted at insertion site, patient denies c/o pain - only slight tenderness at site.  Dressing with slight pressure applied.  D/c Instructions-Education: Discharge instructions given to patient/family with verbalized understanding. D/c education completed with patient/family including follow up instructions, medication list, d/c activities limitations if indicated, with other d/c instructions as indicated by MD - patient able to verbalize understanding, all questions fully answered. Patient  instructed to return to ED, call 911, or call MD for any changes in condition.  Patient escorted via [place], and D/C home via private auto.  Zenaida Deed, RN 11/03/2019 2:14 PM

## 2019-11-03 NOTE — Discharge Instructions (Signed)
1) walk with assistance and with a walker to reduce your risk of falling 2) please note that there is been some adjustments to your medications

## 2019-11-03 NOTE — TOC Transition Note (Signed)
Transition of Care [facility]) - CM/SW Discharge Note   Patient Details  Name: MARNI FRANZONI MRN: [id number] Date of Birth: [birth year]  [facility] CM/SW Contact:  Boneta Lucks, RN Phone Number: 11/03/2019, 10:34 AM   Clinical Narrative:   Patient discharging home today with [facility].     Final next level of care: [place] Barriers to Discharge: Barriers Resolved   Patient Goals and CMS Choice Patient states their goals for this hospitalization and ongoing recovery are:: to go home. CMS Medicare.gov Compare Post Acute Care list provided to:: Patient Represenative (must comment) Choice offered to / list presented to : Adult Children  Discharge Placement           Patient and family notified of of transfer: 11/03/19  Discharge Plan and Services     Post Acute Care Choice: [place]: PT [place]: [place] Date [place]: 11/02/19 Time [place]: 1320 Representative spoke with at [place]: Georgina Snell

## 2019-11-03 NOTE — Discharge Summary (Signed)
AIRABELLA BARLEY, is a 86 y.o. female  DOB [birth year]  MRN [id number].  Admission date:  11/01/2019  Admitting Physician  Bethena Roys, MD  Discharge Date:  11/03/2019   Primary MD  [name], DO  Recommendations for primary care physician for things to follow:   1) walk with assistance and with a walker to reduce your risk of falling 2) please note that there is been some adjustments to your medications 3) please decrease Lipitor/atorvastatin to 20 mg every evening  Admission Diagnosis  Weakness [R53.1] Fall [W19.XXXA] Non-traumatic rhabdomyolysis [M62.82]   Discharge Diagnosis  Weakness [R53.1] Fall [W19.XXXA] Non-traumatic rhabdomyolysis [M62.82]    Principal Problem:   Rhabdomyolysis Active Problems:   Hypertension   CKD (chronic kidney disease) stage 3, GFR 30-59 ml/min   Fall     Past Medical History:  Diagnosis Date  . Elevated troponin 03/26/2018  . Hyperlipidemia   . Hypertension   . Renal disorder    chronic kidney disease  . Rhabdomyolysis   . Stroke [facility])     Past Surgical History:  Procedure Laterality Date  . BREAST SURGERY  1980   R breast removed, not cancer     HPI  from the history and physical done on the day of admission:     Chief Complaint: Fall  HPI: Toni Gomez is a 86 y.o. female with medical history significant for hypertension, syncope, CKD 3, stroke. Patient was brought to the ED with reports of a fall.  On the time of my evaluation patient is awake alert and able to give me detailed history.  She does not appear confused, unable to give me the phone number of her nephew off hand. She tells me today her key fell from her hand and rolled on the car, she bent down but she could not reach to key with her hand, so she laid down on the ground on her back trying to reach the key,  She did not think to get a broom handle to the reach to  key.  She was then unable to get up off the ground, and that was when her nephew saw her, he usually checks on her.  Patient reports she was also incontinent of urine.  See patient on the floor, nephew insisted on calling EMS, though patient reported she was okay. Patient denies any pain with urination, or urinary problems.  No cough, no difficulty breathing.  She reports intermittent dizziness when standing up, but this is not new.  She reports chronic poor p.o. intake, and increasing generalized weakness.  She denies focal weakness of her extremities.  Patient ambulates with a walker but tells me she is mostly in a wheelchair.  Talked to patient's nephew Libbie Bartley on the phone, patient lives alone and still drives, he is unaware how long patient was on the floor.  He and his sister live down the road from patient and check on patient 3 times a day at least.  He also reports increasing lower  extremity weakness in his aunt, and is afraid patient will fall.  ED Course: Blood pressure systolic 203T to 597C otherwise stable vitals.  CK elevated at 3208.  Creatinine 1.17 at baseline.  EKG shows sinus rhythm with right bundle branch block, QTC 499 without significant change from prior.  524mls bolus Given.  Hospitalist admit for rhabdomyolysis, generalized weakness.    Hospital Course:     Rhabdomyolysis-CK 3208 >>>> 1,438 >> 689 Treated with IV fluids  decrease Lipitor to 20 mg daily  Reported fall,generalized weakness- no acute fractures, PT recommends SNF rehab  -Patient's nephew states he will be able to manage at home with home health services  -Patient and family declines SNF placement okay to discharge home with home health  Hypertension--stable continue-Norvasc.  CKD 3-creatinine 1.20, baseline 1.2-1.4.   renally adjust medications, avoid nephrotoxic agents / dehydration  / hypotension -Treated with IV fluids   Disposition: The patient is from: Home  Anticipated  d/c is to: Home with home health    -Remains very weak and unsteady, however patient's nephew insist that he can manage at home -Declines SNF rehab       Code Status : full  Family Communication:     (patient is alert, awake and coherent) --nephew at bedside  Consults  :  na  Discharge Condition: stable  Follow UP   Follow-up Information    [facility].   Specialty: Home Health Services Why:   PT Contact information: [address] [place] [address] [phone]               Diet and Activity recommendation:  As advised  Discharge Instructions    Discharge Instructions    Call MD for:  difficulty breathing, headache or visual disturbances   Complete by: As directed    Call MD for:  persistant dizziness or light-headedness   Complete by: As directed    Call MD for:  persistant nausea and vomiting   Complete by: As directed    Call MD for:  temperature >100.4   Complete by: As directed    Diet - low sodium heart healthy   Complete by: As directed    Discharge instructions   Complete by: As directed    1) walk with assistance and with a walker to reduce your risk of falling 2) please note that there is been some adjustments to your medications 3) please decrease Lipitor/atorvastatin to 20 mg every evening   Increase activity slowly   Complete by: As directed        Discharge Medications     Allergies as of 11/03/2019   No Known Allergies     Medication List    TAKE these medications   acetaminophen 325 MG tablet Commonly known as: TYLENOL Take 2 tablets (650 mg total) by mouth every 6 (six) hours as needed for mild pain (or Fever >/= 101).   amLODipine 5 MG tablet Commonly known as: NORVASC Take 1 tablet (5 mg total) by mouth daily. What changed: how much to take   aspirin EC 81 MG tablet Take 1 tablet (81 mg total) by mouth daily with breakfast. Swallow whole. What changed: when to take  this   atorvastatin 40 MG tablet Commonly known as: LIPITOR Take 0.5 tablets (20 mg total) by mouth every evening. What changed: See the new instructions.   azelastine 0.1 % nasal spray Commonly known as: ASTELIN Place 1 spray into both nostrils 2 (two)  times daily.      Major procedures and Radiology Reports - PLEASE review detailed and final reports for all details, in brief -   DG Pelvis Portable  Result Date: 11/01/2019 CLINICAL DATA:  Found down EXAM: PORTABLE PELVIS 1-2 VIEWS COMPARISON:  None FINDINGS: Patient is rotated in a left anterior oblique flu as well as external rotation of the left hip superimposing portion of the greater trochanter of the femoral neck. These features may limit detection of subtle anomalies. No gross pelvic diastasis or visible pelvic bone fracture. Proximal femora appear intact and normally located with left greater than right joint space narrowing and periacetabular spurring. Enthesopathic changes noted about the pelvis and hips. Multilevel degenerative changes included portions of the lumbar spine and lumbosacral junction. Additional arthrosis of the SI joints and symphysis pubis. Phleboliths in the pelvis. Vascular calcium in the low abdomen and pelvis as well. Some left lateral hip swelling is noted. IMPRESSION: 1. Rotated left hip radiograph with pelvis imaged in obliquity and left hip in external rotation superimposing portion of the greater trochanter of the left femoral neck. These features may limit detection of subtle anomalies. 2. Mild left hip swelling but without gross pelvic diastasis or visible pelvic or proximal femoral fracture. If there is persisting concern, cross-sectional imaging could be obtained. 3. Degenerative changes in the spine, SI joints and symphysis pubis. Electronically Signed   By: Lovena Le M.D.   On: 11/01/2019 17:44    Micro Results   Recent Results (from the past 240 hour(s))  SARS Coronavirus 2 by RT PCR (hospital  order, performed in [facility] hospital lab) Nasopharyngeal Nasopharyngeal Swab     Status: None   Collection Time: 11/01/19  4:26 PM   Specimen: Nasopharyngeal Swab  Result Value Ref Range Status   SARS Coronavirus 2 NEGATIVE NEGATIVE Final    Comment: (NOTE) SARS-CoV-2 target nucleic acids are NOT DETECTED.  The SARS-CoV-2 RNA is generally detectable in upper and lower respiratory specimens during the acute phase of infection. The lowest concentration of SARS-CoV-2 viral copies this assay can detect is 250 copies / mL. A negative result does not preclude SARS-CoV-2 infection and should not be used as the sole basis for treatment or other patient management decisions.  A negative result may occur with improper specimen collection / handling, submission of specimen other than nasopharyngeal swab, presence of viral mutation(s) within the areas targeted by this assay, and inadequate number of viral copies (<250 copies / mL). A negative result must be combined with clinical observations, patient history, and epidemiological information.  Fact Sheet for Patients:   StrictlyIdeas.no  Fact Sheet for Healthcare Providers: BankingDealers.co.za  This test is not yet approved or  cleared by the [place] FDA and has been authorized for detection and/or diagnosis of SARS-CoV-2 by FDA under an Emergency Use Authorization (EUA).  This EUA will remain in effect (meaning this test can be used) for the duration of the COVID-19 declaration under Section 564(b)(1) of the Act, 21 U.S.C. section 360bbb-3(b)(1), unless the authorization is terminated or revoked sooner.  Performed at [facility], [address], [place], [place] [address]     Today   Subjective    Geraldyn Shain today has no new complaints - Remains very weak and unsteady, however patient's nephew insist that he can manage at home -Declines SNF rehab          Patient has  been seen and examined prior to discharge   Objective   Blood  pressure (!) 132/53, pulse (!) 50, temperature 97.6 F (36.4 C), temperature source Oral, resp. rate 16, height 5\' 1"  (1.549 m), weight 55.6 kg, SpO2 98 %.   Intake/Output Summary (Last 24 hours) at 11/03/2019 1336 Last data filed at 11/03/2019 0423 Gross per 24 hour  Intake 481.58 ml  Output 1201 ml  Net -719.42 ml    Exam Gen:- Awake Alert, in no apparent distress  HEENT:- [place].AT, No sclera icterus Neck-Supple Neck,No JVD,.  Lungs-  CTAB , fair symmetrical air movement CV- S1, S2 normal, regular  Abd-  +ve B.Sounds, Abd Soft, No tenderness,    Extremity/Skin:- No  edema, pedal pulses present  Psych-affect is appropriate, oriented x3 Neuro-generalized weakness, no new focal deficits, no tremors   Data Review   CBC w Diff:  Lab Results  Component Value Date   WBC 5.8 11/03/2019   HGB 11.4 (L) 11/03/2019   HGB 13.7 06/27/2019   HCT 36.4 11/03/2019   HCT 41.7 06/27/2019   PLT 233 11/03/2019   PLT 206 06/27/2019   LYMPHOPCT 9 11/01/2019   MONOPCT 7 11/01/2019   EOSPCT 0 11/01/2019   BASOPCT 0 11/01/2019    CMP:  Lab Results  Component Value Date   NA 142 11/03/2019   NA 146 (H) 06/27/2019   K 3.6 11/03/2019   CL 108 11/03/2019   CO2 25 11/03/2019   BUN 21 11/03/2019   BUN 16 06/27/2019   CREATININE 1.20 (H) 11/03/2019   PROT 7.0 11/01/2019   PROT 6.6 06/27/2019   ALBUMIN 2.9 (L) 11/03/2019   ALBUMIN 4.1 06/27/2019   BILITOT 1.4 (H) 11/01/2019   BILITOT 0.4 06/27/2019   ALKPHOS 77 11/01/2019   AST 74 (H) 11/01/2019   ALT 24 11/01/2019  .   Total Discharge time is about 33 minutes  Roxan Hockey M.D on 11/03/2019 at 1:36 PM  Go to [email or web address] -  for contact info  [facility] - Office  [phone]

## 2019-11-06 DIAGNOSIS — M47816 Spondylosis without myelopathy or radiculopathy, lumbar region: Secondary | ICD-10-CM | POA: Diagnosis not present

## 2019-11-06 DIAGNOSIS — M47817 Spondylosis without myelopathy or radiculopathy, lumbosacral region: Secondary | ICD-10-CM | POA: Diagnosis not present

## 2019-11-06 DIAGNOSIS — Z7982 Long term (current) use of aspirin: Secondary | ICD-10-CM | POA: Diagnosis not present

## 2019-11-06 DIAGNOSIS — N183 Chronic kidney disease, stage 3 unspecified: Secondary | ICD-10-CM | POA: Diagnosis not present

## 2019-11-06 DIAGNOSIS — M6282 Rhabdomyolysis: Secondary | ICD-10-CM | POA: Diagnosis not present

## 2019-11-06 DIAGNOSIS — I4510 Unspecified right bundle-branch block: Secondary | ICD-10-CM | POA: Diagnosis not present

## 2019-11-06 DIAGNOSIS — Z79899 Other long term (current) drug therapy: Secondary | ICD-10-CM | POA: Diagnosis not present

## 2019-11-06 DIAGNOSIS — E785 Hyperlipidemia, unspecified: Secondary | ICD-10-CM | POA: Diagnosis not present

## 2019-11-06 DIAGNOSIS — I1310 Hypertensive heart and chronic kidney disease without heart failure, with stage 1 through stage 4 chronic kidney disease, or unspecified chronic kidney disease: Secondary | ICD-10-CM | POA: Diagnosis not present

## 2019-11-07 DIAGNOSIS — L84 Corns and callosities: Secondary | ICD-10-CM | POA: Diagnosis not present

## 2019-11-07 DIAGNOSIS — I70203 Unspecified atherosclerosis of native arteries of extremities, bilateral legs: Secondary | ICD-10-CM | POA: Diagnosis not present

## 2019-11-07 DIAGNOSIS — M79676 Pain in unspecified toe(s): Secondary | ICD-10-CM | POA: Diagnosis not present

## 2019-11-07 DIAGNOSIS — B351 Tinea unguium: Secondary | ICD-10-CM | POA: Diagnosis not present

## 2019-11-09 ENCOUNTER — Other Ambulatory Visit: Payer: Self-pay

## 2019-11-09 ENCOUNTER — Encounter: Payer: Self-pay | Admitting: Family Medicine

## 2019-11-09 ENCOUNTER — Ambulatory Visit (INDEPENDENT_AMBULATORY_CARE_PROVIDER_SITE_OTHER): Payer: Medicare HMO | Admitting: Family Medicine

## 2019-11-09 VITALS — BP 133/63 | HR 58 | Temp 97.4°F | Ht 61.0 in | Wt 130.6 lb

## 2019-11-09 DIAGNOSIS — R630 Anorexia: Secondary | ICD-10-CM | POA: Diagnosis not present

## 2019-11-09 DIAGNOSIS — Z09 Encounter for follow-up examination after completed treatment for conditions other than malignant neoplasm: Secondary | ICD-10-CM

## 2019-11-09 DIAGNOSIS — M6282 Rhabdomyolysis: Secondary | ICD-10-CM | POA: Diagnosis not present

## 2019-11-09 DIAGNOSIS — N1831 Chronic kidney disease, stage 3a: Secondary | ICD-10-CM

## 2019-11-09 DIAGNOSIS — D631 Anemia in chronic kidney disease: Secondary | ICD-10-CM | POA: Diagnosis not present

## 2019-11-09 DIAGNOSIS — R531 Weakness: Secondary | ICD-10-CM | POA: Diagnosis not present

## 2019-11-09 DIAGNOSIS — H6123 Impacted cerumen, bilateral: Secondary | ICD-10-CM

## 2019-11-09 DIAGNOSIS — H9193 Unspecified hearing loss, bilateral: Secondary | ICD-10-CM

## 2019-11-09 DIAGNOSIS — R6889 Other general symptoms and signs: Secondary | ICD-10-CM | POA: Diagnosis not present

## 2019-11-09 MED ORDER — MIRTAZAPINE 7.5 MG PO TABS: 7.5000 mg | ORAL_TABLET | Freq: Every day | ORAL | 2 refills | Status: DC

## 2019-11-09 NOTE — Patient Instructions (Addendum)
Referral in place for hearing aids  [facility] aide ordered.

## 2019-11-09 NOTE — Progress Notes (Addendum)
Subjective: CC: hospital discharge PCP: [name], DO Toni Gomez is a 86 y.o. female presenting to clinic today for:  1. Weakness Patient was hospitalized for weakness.  She was found to have a nontraumatic rhabdomyolysis.  Apparently this occurred after she attempted to reach a key that fell from her hand and rolled under the car.  She laid down on the ground but unfortunately could never get back up off the ground.  Questionable time down on the ground.  On admission her CK was noted to be 3208.  This gradually got better with IV fluids.  Lipitor was reduced to 20 mg daily.  Physical therapy actually recommended SNF rehab.  However, her nephew wanted to bring her home with home health.  She is accompanied to the office by Toni Gomez today.  Overall she seems to be doing well.  This is the first time that she walked around with her walker outside of the home that she was discharged from the hospital.  She continues to reside mostly in a wheelchair at home with her legs down.  She does admit to increased swelling of the lower extremities as a result.  She is working with physical therapy twice a week and she does feel that this is helping.  Though she still admits that she is quite weaker than her baseline.  No falls.  She has been needing assistance getting up from a seated position.  She would like to get a home health aide.  She is mostly only able to prepare very small snacks but not cook formal meals for herself.  She has several family members the pop and intermittently to check in on her.  She does admit that she does not have a good appetite I would like something to stimulate her appetite.  2.  Difficulty hearing Patient reports difficulty hearing bilaterally.  She would like to see an audiologist for hearing aids but would also like to get her ears checked as she often feels of excessive cerumen.  ROS: Per HPI  No Known Allergies Past Medical History:  Diagnosis Date   . Elevated troponin 03/26/2018  . Hyperlipidemia   . Hypertension   . Renal disorder    chronic kidney disease  . Rhabdomyolysis   . Stroke [facility])     Current Outpatient Medications:  .  acetaminophen (TYLENOL) 325 MG tablet, Take 2 tablets (650 mg total) by mouth every 6 (six) hours as needed for mild pain (or Fever >/= 101)., Disp: 12 tablet, Rfl: 0 .  amLODipine (NORVASC) 5 MG tablet, Take 1 tablet (5 mg total) by mouth daily., Disp: 30 tablet, Rfl: 5 .  aspirin EC 81 MG tablet, Take 1 tablet (81 mg total) by mouth daily with breakfast. Swallow whole., Disp: 30 tablet, Rfl: 11 .  atorvastatin (LIPITOR) 40 MG tablet, Take 0.5 tablets (20 mg total) by mouth every evening., Disp: 45 tablet, Rfl: 0 .  azelastine (ASTELIN) 0.1 % nasal spray, Place 1 spray into both nostrils 2 (two) times daily., Disp: 30 mL, Rfl: 12 Social History   Socioeconomic History  . Marital status: Widowed    Spouse name: Not on file  . Number of children: 0  . Years of education: Not on file  . Highest education level: 7th grade  Occupational History  . Occupation: retired  Tobacco Use  . Smoking status: Never Smoker  . Smokeless tobacco: Never Used  Vaping Use  . Vaping Use: Never used  Substance and Sexual  Activity  . Alcohol use: No  . Drug use: No  . Sexual activity: Not Currently  Other Topics Concern  . Not on file  Social History Narrative  . Not on file   Social Determinants of Health   Financial Resource Strain:   . Difficulty of Paying Living Expenses:   Food Insecurity:   . Worried About Charity fundraiser in the Last Year:   . Arboriculturist in the Last Year:   Transportation Needs:   . Film/video editor (Medical):   Marland Kitchen Lack of Transportation (Non-Medical):   Physical Activity:   . Days of Exercise per Week:   . Minutes of Exercise per Session:   Stress:   . Feeling of Stress :   Social Connections:   . Frequency of Communication with Friends and Family:   . Frequency  of Social Gatherings with Friends and Family:   . Attends Religious Services:   . Active Member of Clubs or Organizations:   . Attends Archivist Meetings:   Marland Kitchen Marital Status:   Intimate Partner Violence:   . Fear of Current or Ex-Partner:   . Emotionally Abused:   Marland Kitchen Physically Abused:   . Sexually Abused:    Family History  Problem Relation Age of Onset  . Hypertension Other   . Hypertension Mother   . Stroke Mother   . Asthma Father     Objective: Office vital signs reviewed. BP 133/63   Pulse (!) 58   Temp (!) 97.4 F (36.3 C) (Temporal)   Ht 5\' 1"  (1.549 m)   Wt 130 lb 9.6 oz (59.2 kg)   SpO2 99%   BMI 24.68 kg/m   Physical Examination:  General: Awake, alert, elderly female, No acute distress HEENT: Normal; bilateral TMs occluded by cerumen. Cardio: regular rate and rhythm, S1S2 heard, no murmurs appreciated Pulm: clear to auscultation bilaterally, no wheezes, rhonchi or rales; normal work of breathing on room air Extremities: warm, well perfused, 1-2+ edema to mid shins, No cyanosis or clubbing; +2 pulses bilaterally MSK: slow, unsteady gait and station; unable to get up from a seated position without assistance (this is a change from last visit).  She uses a rolling walker for ambulation Skin: Multiple areas of ecchymosis noted along left forearm and wrist.  She has similar lesions that appear to be healing along the right elbow  Assessment/ Plan: 86 y.o. female   1. Hospital discharge follow-up I reviewed her hospital discharge summary and recommendations.  We will repeat her labs to ensure that they are returning to her baseline.  I have ordered home health nurses aide to help her with things at the house that she has nobody living with her. - Basic Metabolic Panel - CBC - CK - Ambulatory referral to Home Health  2. Non-traumatic rhabdomyolysis CK was downtrending. - Basic Metabolic Panel - CK - Ambulatory referral to Home Health  3.  Weakness Continues to be quite weak and I can visibly see that she is not her baseline today.  She is to continue working with home physical therapy/ OT and I will try to get a home health aide to help her - Basic Metabolic Panel - CBC - CK - Ambulatory referral to [facility]  4. Bilateral impacted cerumen This was removed by irrigation today.  I gone ahead and place referral to audiology they do think she still needs a hearing check  5. Anemia due to stage 3a chronic kidney disease -  Anemia panel  6. Poor appetite I have added Remeron to see if we can maybe stimulate her appetite and improve sleep.  She will follow-up in about 4 to 6 weeks for recheck of weight and symptomology - mirtazapine (REMERON) 7.5 MG tablet; Take 1 tablet (7.5 mg total) by mouth at bedtime.  Dispense: 30 tablet; Refill: 2  7. Hearing difficulty of both ears - Ambulatory referral to Audiology   No orders of the defined types were placed in this encounter.  No orders of the defined types were placed in this encounter.    [name], DO Toni Gomez [phone]

## 2019-11-10 ENCOUNTER — Encounter: Payer: Self-pay | Admitting: Family Medicine

## 2019-11-10 DIAGNOSIS — M6282 Rhabdomyolysis: Secondary | ICD-10-CM | POA: Diagnosis not present

## 2019-11-10 DIAGNOSIS — Z79899 Other long term (current) drug therapy: Secondary | ICD-10-CM | POA: Diagnosis not present

## 2019-11-10 DIAGNOSIS — Z7982 Long term (current) use of aspirin: Secondary | ICD-10-CM | POA: Diagnosis not present

## 2019-11-10 DIAGNOSIS — I1310 Hypertensive heart and chronic kidney disease without heart failure, with stage 1 through stage 4 chronic kidney disease, or unspecified chronic kidney disease: Secondary | ICD-10-CM | POA: Diagnosis not present

## 2019-11-10 DIAGNOSIS — E785 Hyperlipidemia, unspecified: Secondary | ICD-10-CM | POA: Diagnosis not present

## 2019-11-10 DIAGNOSIS — M47817 Spondylosis without myelopathy or radiculopathy, lumbosacral region: Secondary | ICD-10-CM | POA: Diagnosis not present

## 2019-11-10 DIAGNOSIS — M47816 Spondylosis without myelopathy or radiculopathy, lumbar region: Secondary | ICD-10-CM | POA: Diagnosis not present

## 2019-11-10 DIAGNOSIS — N183 Chronic kidney disease, stage 3 unspecified: Secondary | ICD-10-CM | POA: Diagnosis not present

## 2019-11-10 DIAGNOSIS — I4510 Unspecified right bundle-branch block: Secondary | ICD-10-CM | POA: Diagnosis not present

## 2019-11-10 LAB — ANEMIA PANEL
Ferritin: 158 ng/mL — ABNORMAL HIGH (ref 15–150)
Folate, Hemolysate: 304 ng/mL
Folate, RBC: 833 ng/mL (ref >498)
Hematocrit: 36.5 % (ref 34.0–46.6)
Iron Saturation: 23 % (ref 15–55)
Iron: 51 ug/dL (ref 27–139)
Retic Ct Pct: 1.3 % (ref 0.6–2.6)
Total Iron Binding Capacity: 226 ug/dL — ABNORMAL LOW (ref 250–450)
UIBC: 175 ug/dL (ref 118–369)
Vitamin B-12: 369 pg/mL (ref 232–1245)

## 2019-11-10 LAB — BASIC METABOLIC PANEL
BUN/Creatinine Ratio: 18 (ref 12–28)
BUN: 25 mg/dL (ref 10–36)
CO2: 22 mmol/L (ref 20–29)
Calcium: 10.8 mg/dL — ABNORMAL HIGH (ref 8.7–10.3)
Chloride: 105 mmol/L (ref 96–106)
Creatinine, Ser: 1.41 mg/dL — ABNORMAL HIGH (ref 0.57–1.00)
GFR calc Af Amer: 37 mL/min/{1.73_m2} — ABNORMAL LOW (ref >59)
GFR calc non Af Amer: 32 mL/min/{1.73_m2} — ABNORMAL LOW (ref >59)
Glucose: 84 mg/dL (ref 65–99)
Potassium: 4.9 mmol/L (ref 3.5–5.2)
Sodium: 143 mmol/L (ref 134–144)

## 2019-11-10 LAB — CBC
Hemoglobin: 11.8 g/dL (ref 11.1–15.9)
MCH: 27.4 pg (ref 26.6–33.0)
MCHC: 32.3 g/dL (ref 31.5–35.7)
MCV: 85 fL (ref 79–97)
Platelets: 281 10*3/uL (ref 150–450)
RBC: 4.3 x10E6/uL (ref 3.77–5.28)
RDW: 14.4 % (ref 11.7–15.4)
WBC: 5.2 10*3/uL (ref 3.4–10.8)

## 2019-11-10 LAB — CK: Total CK: 91 U/L (ref 26–161)

## 2019-11-13 DIAGNOSIS — M47817 Spondylosis without myelopathy or radiculopathy, lumbosacral region: Secondary | ICD-10-CM | POA: Diagnosis not present

## 2019-11-13 DIAGNOSIS — I4510 Unspecified right bundle-branch block: Secondary | ICD-10-CM | POA: Diagnosis not present

## 2019-11-13 DIAGNOSIS — Z7982 Long term (current) use of aspirin: Secondary | ICD-10-CM | POA: Diagnosis not present

## 2019-11-13 DIAGNOSIS — M47816 Spondylosis without myelopathy or radiculopathy, lumbar region: Secondary | ICD-10-CM | POA: Diagnosis not present

## 2019-11-13 DIAGNOSIS — I1310 Hypertensive heart and chronic kidney disease without heart failure, with stage 1 through stage 4 chronic kidney disease, or unspecified chronic kidney disease: Secondary | ICD-10-CM | POA: Diagnosis not present

## 2019-11-13 DIAGNOSIS — Z79899 Other long term (current) drug therapy: Secondary | ICD-10-CM | POA: Diagnosis not present

## 2019-11-13 DIAGNOSIS — N183 Chronic kidney disease, stage 3 unspecified: Secondary | ICD-10-CM | POA: Diagnosis not present

## 2019-11-13 DIAGNOSIS — M6282 Rhabdomyolysis: Secondary | ICD-10-CM | POA: Diagnosis not present

## 2019-11-13 DIAGNOSIS — E785 Hyperlipidemia, unspecified: Secondary | ICD-10-CM | POA: Diagnosis not present

## 2019-11-14 ENCOUNTER — Telehealth: Payer: Self-pay | Admitting: *Deleted

## 2019-11-14 NOTE — Telephone Encounter (Signed)
Sent a message via Community message to [name] to see if they can take the pt.

## 2019-11-20 DIAGNOSIS — I4510 Unspecified right bundle-branch block: Secondary | ICD-10-CM | POA: Diagnosis not present

## 2019-11-20 DIAGNOSIS — N183 Chronic kidney disease, stage 3 unspecified: Secondary | ICD-10-CM | POA: Diagnosis not present

## 2019-11-20 DIAGNOSIS — Z7982 Long term (current) use of aspirin: Secondary | ICD-10-CM | POA: Diagnosis not present

## 2019-11-20 DIAGNOSIS — M6282 Rhabdomyolysis: Secondary | ICD-10-CM | POA: Diagnosis not present

## 2019-11-20 DIAGNOSIS — E785 Hyperlipidemia, unspecified: Secondary | ICD-10-CM | POA: Diagnosis not present

## 2019-11-20 DIAGNOSIS — M47817 Spondylosis without myelopathy or radiculopathy, lumbosacral region: Secondary | ICD-10-CM | POA: Diagnosis not present

## 2019-11-20 DIAGNOSIS — M47816 Spondylosis without myelopathy or radiculopathy, lumbar region: Secondary | ICD-10-CM | POA: Diagnosis not present

## 2019-11-20 DIAGNOSIS — Z79899 Other long term (current) drug therapy: Secondary | ICD-10-CM | POA: Diagnosis not present

## 2019-11-20 DIAGNOSIS — I1310 Hypertensive heart and chronic kidney disease without heart failure, with stage 1 through stage 4 chronic kidney disease, or unspecified chronic kidney disease: Secondary | ICD-10-CM | POA: Diagnosis not present

## 2019-11-22 ENCOUNTER — Other Ambulatory Visit: Payer: Self-pay

## 2019-11-22 ENCOUNTER — Ambulatory Visit (INDEPENDENT_AMBULATORY_CARE_PROVIDER_SITE_OTHER): Payer: Medicare HMO

## 2019-11-22 DIAGNOSIS — E785 Hyperlipidemia, unspecified: Secondary | ICD-10-CM | POA: Diagnosis not present

## 2019-11-22 DIAGNOSIS — I4510 Unspecified right bundle-branch block: Secondary | ICD-10-CM | POA: Diagnosis not present

## 2019-11-22 DIAGNOSIS — Z7982 Long term (current) use of aspirin: Secondary | ICD-10-CM

## 2019-11-22 DIAGNOSIS — I1310 Hypertensive heart and chronic kidney disease without heart failure, with stage 1 through stage 4 chronic kidney disease, or unspecified chronic kidney disease: Secondary | ICD-10-CM | POA: Diagnosis not present

## 2019-11-22 DIAGNOSIS — Z79899 Other long term (current) drug therapy: Secondary | ICD-10-CM | POA: Diagnosis not present

## 2019-11-22 DIAGNOSIS — M47817 Spondylosis without myelopathy or radiculopathy, lumbosacral region: Secondary | ICD-10-CM | POA: Diagnosis not present

## 2019-11-22 DIAGNOSIS — M47816 Spondylosis without myelopathy or radiculopathy, lumbar region: Secondary | ICD-10-CM

## 2019-11-22 DIAGNOSIS — M6282 Rhabdomyolysis: Secondary | ICD-10-CM | POA: Diagnosis not present

## 2019-11-22 DIAGNOSIS — Z8673 Personal history of transient ischemic attack (TIA), and cerebral infarction without residual deficits: Secondary | ICD-10-CM

## 2019-11-22 DIAGNOSIS — Z9011 Acquired absence of right breast and nipple: Secondary | ICD-10-CM

## 2019-11-22 DIAGNOSIS — N183 Chronic kidney disease, stage 3 unspecified: Secondary | ICD-10-CM

## 2019-11-22 DIAGNOSIS — Z9181 History of falling: Secondary | ICD-10-CM

## 2019-11-23 ENCOUNTER — Ambulatory Visit: Payer: Medicare HMO | Admitting: "Endocrinology

## 2019-11-23 NOTE — Telephone Encounter (Signed)
[facility] unable to take pt

## 2019-11-23 NOTE — Telephone Encounter (Signed)
Community message sent to Nash Shearer w/ [facility]

## 2019-11-24 DIAGNOSIS — Z79899 Other long term (current) drug therapy: Secondary | ICD-10-CM | POA: Diagnosis not present

## 2019-11-24 DIAGNOSIS — I4510 Unspecified right bundle-branch block: Secondary | ICD-10-CM | POA: Diagnosis not present

## 2019-11-24 DIAGNOSIS — M47816 Spondylosis without myelopathy or radiculopathy, lumbar region: Secondary | ICD-10-CM | POA: Diagnosis not present

## 2019-11-24 DIAGNOSIS — M47817 Spondylosis without myelopathy or radiculopathy, lumbosacral region: Secondary | ICD-10-CM | POA: Diagnosis not present

## 2019-11-24 DIAGNOSIS — N183 Chronic kidney disease, stage 3 unspecified: Secondary | ICD-10-CM | POA: Diagnosis not present

## 2019-11-24 DIAGNOSIS — E785 Hyperlipidemia, unspecified: Secondary | ICD-10-CM | POA: Diagnosis not present

## 2019-11-24 DIAGNOSIS — I1310 Hypertensive heart and chronic kidney disease without heart failure, with stage 1 through stage 4 chronic kidney disease, or unspecified chronic kidney disease: Secondary | ICD-10-CM | POA: Diagnosis not present

## 2019-11-24 DIAGNOSIS — Z7982 Long term (current) use of aspirin: Secondary | ICD-10-CM | POA: Diagnosis not present

## 2019-11-24 DIAGNOSIS — M6282 Rhabdomyolysis: Secondary | ICD-10-CM | POA: Diagnosis not present

## 2019-11-27 DIAGNOSIS — I1310 Hypertensive heart and chronic kidney disease without heart failure, with stage 1 through stage 4 chronic kidney disease, or unspecified chronic kidney disease: Secondary | ICD-10-CM | POA: Diagnosis not present

## 2019-11-27 DIAGNOSIS — M47816 Spondylosis without myelopathy or radiculopathy, lumbar region: Secondary | ICD-10-CM | POA: Diagnosis not present

## 2019-11-27 DIAGNOSIS — M6282 Rhabdomyolysis: Secondary | ICD-10-CM | POA: Diagnosis not present

## 2019-11-27 DIAGNOSIS — E785 Hyperlipidemia, unspecified: Secondary | ICD-10-CM | POA: Diagnosis not present

## 2019-11-27 DIAGNOSIS — Z7982 Long term (current) use of aspirin: Secondary | ICD-10-CM | POA: Diagnosis not present

## 2019-11-27 DIAGNOSIS — Z79899 Other long term (current) drug therapy: Secondary | ICD-10-CM | POA: Diagnosis not present

## 2019-11-27 DIAGNOSIS — M47817 Spondylosis without myelopathy or radiculopathy, lumbosacral region: Secondary | ICD-10-CM | POA: Diagnosis not present

## 2019-11-27 DIAGNOSIS — I4510 Unspecified right bundle-branch block: Secondary | ICD-10-CM | POA: Diagnosis not present

## 2019-11-27 DIAGNOSIS — N183 Chronic kidney disease, stage 3 unspecified: Secondary | ICD-10-CM | POA: Diagnosis not present

## 2019-11-30 ENCOUNTER — Ambulatory Visit: Payer: Medicare HMO | Admitting: "Endocrinology

## 2019-11-30 ENCOUNTER — Other Ambulatory Visit: Payer: Self-pay | Admitting: "Endocrinology

## 2019-12-01 LAB — PTH, INTACT AND CALCIUM
Calcium: 9.4 mg/dL (ref 8.7–10.3)
PTH: 63 pg/mL (ref 15–65)

## 2019-12-01 LAB — CALCITRIOL (1,25 DI-OH VIT D): Vit D, 1,25-Dihydroxy: 18.3 pg/mL — ABNORMAL LOW (ref 19.9–79.3)

## 2019-12-01 LAB — PHOSPHORUS: Phosphorus: 3.8 mg/dL (ref 3.0–4.3)

## 2019-12-01 LAB — MAGNESIUM: Magnesium: 2.4 mg/dL — ABNORMAL HIGH (ref 1.6–2.3)

## 2019-12-06 DIAGNOSIS — M47817 Spondylosis without myelopathy or radiculopathy, lumbosacral region: Secondary | ICD-10-CM | POA: Diagnosis not present

## 2019-12-06 DIAGNOSIS — I4510 Unspecified right bundle-branch block: Secondary | ICD-10-CM | POA: Diagnosis not present

## 2019-12-06 DIAGNOSIS — I1310 Hypertensive heart and chronic kidney disease without heart failure, with stage 1 through stage 4 chronic kidney disease, or unspecified chronic kidney disease: Secondary | ICD-10-CM | POA: Diagnosis not present

## 2019-12-06 DIAGNOSIS — M6282 Rhabdomyolysis: Secondary | ICD-10-CM | POA: Diagnosis not present

## 2019-12-06 DIAGNOSIS — N183 Chronic kidney disease, stage 3 unspecified: Secondary | ICD-10-CM | POA: Diagnosis not present

## 2019-12-06 DIAGNOSIS — Z79899 Other long term (current) drug therapy: Secondary | ICD-10-CM | POA: Diagnosis not present

## 2019-12-06 DIAGNOSIS — E785 Hyperlipidemia, unspecified: Secondary | ICD-10-CM | POA: Diagnosis not present

## 2019-12-06 DIAGNOSIS — Z7982 Long term (current) use of aspirin: Secondary | ICD-10-CM | POA: Diagnosis not present

## 2019-12-06 DIAGNOSIS — M47816 Spondylosis without myelopathy or radiculopathy, lumbar region: Secondary | ICD-10-CM | POA: Diagnosis not present

## 2019-12-08 ENCOUNTER — Other Ambulatory Visit: Payer: Self-pay | Admitting: *Deleted

## 2019-12-08 DIAGNOSIS — R630 Anorexia: Secondary | ICD-10-CM

## 2019-12-08 DIAGNOSIS — I10 Essential (primary) hypertension: Secondary | ICD-10-CM

## 2019-12-08 MED ORDER — MIRTAZAPINE 7.5 MG PO TABS: 7.5000 mg | ORAL_TABLET | Freq: Every day | ORAL | 0 refills | Status: DC

## 2019-12-08 MED ORDER — AMLODIPINE BESYLATE 5 MG PO TABS: 5.0000 mg | ORAL_TABLET | Freq: Every day | ORAL | 5 refills | Status: DC

## 2019-12-08 MED ORDER — ATORVASTATIN CALCIUM 20 MG PO TABS: 20.0000 mg | ORAL_TABLET | Freq: Every day | ORAL | 3 refills | Status: DC

## 2019-12-08 NOTE — Addendum Note (Signed)
Addended by: [name] on: 12/08/2019 08:43 AM   Modules accepted: Orders

## 2019-12-08 NOTE — Telephone Encounter (Signed)
[facility] is from her endocrinilogist, please send to them.

## 2019-12-11 ENCOUNTER — Other Ambulatory Visit: Payer: Self-pay | Admitting: *Deleted

## 2019-12-11 DIAGNOSIS — I10 Essential (primary) hypertension: Secondary | ICD-10-CM

## 2019-12-11 MED ORDER — AMLODIPINE BESYLATE 5 MG PO TABS: 5.0000 mg | ORAL_TABLET | Freq: Every day | ORAL | 1 refills | Status: DC

## 2019-12-13 ENCOUNTER — Ambulatory Visit: Payer: Medicare HMO | Attending: Audiology | Admitting: Audiology

## 2019-12-13 ENCOUNTER — Other Ambulatory Visit: Payer: Self-pay

## 2019-12-13 DIAGNOSIS — H903 Sensorineural hearing loss, bilateral: Secondary | ICD-10-CM | POA: Insufficient documentation

## 2019-12-13 NOTE — Procedures (Signed)
  Outpatient Audiology and [place], [place]  [address] [phone]  AUDIOLOGICAL  EVALUATION  NAME: Toni Gomez     DOB:   [birth year]      MRN: [id number]                                                                                     DATE: 12/13/2019     REFERENT: [name], DO STATUS: Outpatient DIAGNOSIS: Sensorineural Hearing Loss, bilateral    History: [name] was seen for an audiological evaluation. [name] was accompanied to the appointment by her Nephew. [name] reports decreased hearing occurring for 1 year. She reports increased difficulty communicating and understanding conversations with her friends and family. [name] reports increased difficulty hearing on the telephone. She denies otalgia, aural fullness, and dizziness.   Evaluation:   Otoscopy showed non-occluding cerumen, bilaterally  Tympanometry results were consistent with normal middle ear function, bilaterally.   Audiometric testing was completed using Conventional Audiometry techniques with insert earphones and TDH headphones. Test results are consistent with mild to severe sensorineural hearing loss, bilaterally. A Speech Recognition Threshold (SRT) was attempted however [name] had difficulty understanding the words therefore a Speech Detection Threshold (SDT) was obtained at 60 dB HL in the right ear and at 55 dB HL in the left ear. Word Recognition Testing was completed using W-22 words at 95 dB HL in the right ear and [name] scored 32% and at 90 dB HL in the left ear and [name] scored 36%.   Results:  Today's testing is consistent with a mild sloping to severe sensorineural hearing loss, bilaterally. [name] will have communication difficulty in all listening environments. She will benefit from the use of good communication strategies, the use of amplification, or personal amplification devices. The test results and recommendations were reviewed with  [name] and her Nephew.   Recommendations: 1. Caption Call Telephone  2. Communication Needs Assessment with an Audiologist if [name] is interested in hearing aids.  3. TV Ears can be purchased online, on [place], [place], [place] 4. The following are all amplified headsets that [name] can easily wear at home or with family to help her hear day to day conversations without the cost or upkeep of a hearing aids. The can be purchased online.  1. BeHear Access by [employer] Hear 2. Bose Hearphones 3. [place] with Gar Gibbon Audiologist, Au.D., CCC-A 12/13/2019  4:00 PM  Cc: [name], DO

## 2019-12-15 ENCOUNTER — Other Ambulatory Visit: Payer: Self-pay | Admitting: *Deleted

## 2019-12-15 NOTE — Telephone Encounter (Signed)
Again. PLEASE SEND TO ORIGINAL PRESCRIBER = her endocrinologist, Dr Dorris Fetch.

## 2019-12-20 ENCOUNTER — Ambulatory Visit: Payer: Medicare HMO | Admitting: "Endocrinology

## 2019-12-20 DIAGNOSIS — R5381 Other malaise: Secondary | ICD-10-CM | POA: Diagnosis not present

## 2019-12-20 DIAGNOSIS — Z743 Need for continuous supervision: Secondary | ICD-10-CM | POA: Diagnosis not present

## 2019-12-22 ENCOUNTER — Encounter (HOSPITAL_COMMUNITY): Payer: Self-pay | Admitting: Emergency Medicine

## 2019-12-22 ENCOUNTER — Other Ambulatory Visit: Payer: Self-pay

## 2019-12-22 ENCOUNTER — Ambulatory Visit: Payer: Medicare HMO | Admitting: Family Medicine

## 2019-12-22 ENCOUNTER — Observation Stay (HOSPITAL_COMMUNITY)
Admission: EM | Admit: 2019-12-22 | Discharge: 2019-12-23 | Disposition: A | Discharge status: Home / Self Care | Payer: Medicare HMO | Attending: Family Medicine | Admitting: Family Medicine

## 2019-12-22 ENCOUNTER — Emergency Department (HOSPITAL_COMMUNITY): Payer: Medicare HMO

## 2019-12-22 DIAGNOSIS — E213 Hyperparathyroidism, unspecified: Secondary | ICD-10-CM | POA: Diagnosis not present

## 2019-12-22 DIAGNOSIS — Z79899 Other long term (current) drug therapy: Secondary | ICD-10-CM | POA: Diagnosis not present

## 2019-12-22 DIAGNOSIS — N183 Chronic kidney disease, stage 3 unspecified: Secondary | ICD-10-CM | POA: Diagnosis not present

## 2019-12-22 DIAGNOSIS — R630 Anorexia: Secondary | ICD-10-CM | POA: Insufficient documentation

## 2019-12-22 DIAGNOSIS — R627 Adult failure to thrive: Secondary | ICD-10-CM | POA: Diagnosis not present

## 2019-12-22 DIAGNOSIS — N1832 Chronic kidney disease, stage 3b: Secondary | ICD-10-CM | POA: Diagnosis not present

## 2019-12-22 DIAGNOSIS — I10 Essential (primary) hypertension: Secondary | ICD-10-CM | POA: Diagnosis not present

## 2019-12-22 DIAGNOSIS — I129 Hypertensive chronic kidney disease with stage 1 through stage 4 chronic kidney disease, or unspecified chronic kidney disease: Secondary | ICD-10-CM | POA: Insufficient documentation

## 2019-12-22 DIAGNOSIS — Z7982 Long term (current) use of aspirin: Secondary | ICD-10-CM | POA: Diagnosis not present

## 2019-12-22 DIAGNOSIS — E210 Primary hyperparathyroidism: Secondary | ICD-10-CM | POA: Diagnosis present

## 2019-12-22 DIAGNOSIS — E785 Hyperlipidemia, unspecified: Secondary | ICD-10-CM | POA: Diagnosis present

## 2019-12-22 DIAGNOSIS — R55 Syncope and collapse: Secondary | ICD-10-CM | POA: Diagnosis not present

## 2019-12-22 DIAGNOSIS — I6782 Cerebral ischemia: Secondary | ICD-10-CM | POA: Diagnosis not present

## 2019-12-22 DIAGNOSIS — Z20822 Contact with and (suspected) exposure to covid-19: Secondary | ICD-10-CM | POA: Diagnosis not present

## 2019-12-22 DIAGNOSIS — R9082 White matter disease, unspecified: Secondary | ICD-10-CM | POA: Diagnosis not present

## 2019-12-22 DIAGNOSIS — G319 Degenerative disease of nervous system, unspecified: Secondary | ICD-10-CM | POA: Diagnosis not present

## 2019-12-22 LAB — SARS CORONAVIRUS 2 BY RT PCR (HOSPITAL ORDER, PERFORMED IN ~~LOC~~ HOSPITAL LAB): SARS Coronavirus 2: NEGATIVE

## 2019-12-22 LAB — URINALYSIS, ROUTINE W REFLEX MICROSCOPIC
Bacteria, UA: NONE SEEN
Bilirubin Urine: NEGATIVE
Glucose, UA: NEGATIVE mg/dL
Hgb urine dipstick: NEGATIVE
Ketones, ur: 5 mg/dL — AB
Leukocytes,Ua: NEGATIVE
Nitrite: NEGATIVE
Protein, ur: 100 mg/dL — AB
Specific Gravity, Urine: 1.01 (ref 1.005–1.030)
pH: 6 (ref 5.0–8.0)

## 2019-12-22 LAB — BASIC METABOLIC PANEL
Anion gap: 11 (ref 5–15)
BUN: 26 mg/dL — ABNORMAL HIGH (ref 8–23)
CO2: 22 mmol/L (ref 22–32)
Calcium: 10.1 mg/dL (ref 8.9–10.3)
Chloride: 106 mmol/L (ref 98–111)
Creatinine, Ser: 1.26 mg/dL — ABNORMAL HIGH (ref 0.44–1.00)
GFR calc Af Amer: 42 mL/min — ABNORMAL LOW (ref >60)
GFR calc non Af Amer: 36 mL/min — ABNORMAL LOW (ref >60)
Glucose, Bld: 83 mg/dL (ref 70–99)
Potassium: 4 mmol/L (ref 3.5–5.1)
Sodium: 139 mmol/L (ref 135–145)

## 2019-12-22 LAB — CBC
HCT: 39.9 % (ref 36.0–46.0)
Hemoglobin: 12.4 g/dL (ref 12.0–15.0)
MCH: 27.3 pg (ref 26.0–34.0)
MCHC: 31.1 g/dL (ref 30.0–36.0)
MCV: 87.7 fL (ref 80.0–100.0)
Platelets: 282 10*3/uL (ref 150–400)
RBC: 4.55 MIL/uL (ref 3.87–5.11)
RDW: 15.5 % (ref 11.5–15.5)
WBC: 4.8 10*3/uL (ref 4.0–10.5)
nRBC: 0 % (ref 0.0–0.2)

## 2019-12-22 LAB — LIPASE, BLOOD: Lipase: 29 U/L (ref 11–51)

## 2019-12-22 LAB — HEPATIC FUNCTION PANEL
ALT: 23 U/L (ref 0–44)
AST: 38 U/L (ref 15–41)
Albumin: 3.9 g/dL (ref 3.5–5.0)
Alkaline Phosphatase: 81 U/L (ref 38–126)
Bilirubin, Direct: 0.1 mg/dL (ref 0.0–0.2)
Indirect Bilirubin: 0.5 mg/dL (ref 0.3–0.9)
Total Bilirubin: 0.6 mg/dL (ref 0.3–1.2)
Total Protein: 6.8 g/dL (ref 6.5–8.1)

## 2019-12-22 LAB — TROPONIN I (HIGH SENSITIVITY)
Troponin I (High Sensitivity): 22 ng/L — ABNORMAL HIGH (ref <18)
Troponin I (High Sensitivity): 25 ng/L — ABNORMAL HIGH (ref <18)

## 2019-12-22 LAB — CBG MONITORING, ED: Glucose-Capillary: 95 mg/dL (ref 70–99)

## 2019-12-22 MED ORDER — ASPIRIN EC 81 MG PO TBEC
81.0000 mg | DELAYED_RELEASE_TABLET | Freq: Every day | ORAL | Status: DC
  Administered 2019-12-23: 81 mg via ORAL
  Filled 2019-12-22: qty 1

## 2019-12-22 MED ORDER — AZELASTINE HCL 0.1 % NA SOLN
1.0000 | Freq: Two times a day (BID) | NASAL | Status: DC
  Administered 2019-12-23: 1 via NASAL
  Filled 2019-12-22 (×2): qty 30

## 2019-12-22 MED ORDER — ENSURE ENLIVE PO LIQD
237.0000 mL | Freq: Two times a day (BID) | ORAL | Status: DC
  Administered 2019-12-23: 237 mL via ORAL

## 2019-12-22 MED ORDER — MIRTAZAPINE 15 MG PO TABS
15.0000 mg | ORAL_TABLET | Freq: Every day | ORAL | Status: DC
  Filled 2019-12-22: qty 1

## 2019-12-22 MED ORDER — ACETAMINOPHEN 650 MG RE SUPP: 650.0000 mg | Freq: Four times a day (QID) | RECTAL | Status: DC | PRN

## 2019-12-22 MED ORDER — SODIUM CHLORIDE 0.9 % IV SOLN: INTRAVENOUS | Status: DC

## 2019-12-22 MED ORDER — SODIUM CHLORIDE 0.9 % IV BOLUS
500.0000 mL | Freq: Once | INTRAVENOUS | Status: AC
  Administered 2019-12-22: 500 mL via INTRAVENOUS

## 2019-12-22 MED ORDER — ACETAMINOPHEN 325 MG PO TABS: 650.0000 mg | ORAL_TABLET | Freq: Four times a day (QID) | ORAL | Status: DC | PRN

## 2019-12-22 MED ORDER — SODIUM CHLORIDE 0.9% FLUSH
3.0000 mL | Freq: Once | INTRAVENOUS | Status: AC
  Administered 2019-12-22: 3 mL via INTRAVENOUS

## 2019-12-22 MED ORDER — SODIUM CHLORIDE 0.9% FLUSH
3.0000 mL | Freq: Two times a day (BID) | INTRAVENOUS | Status: DC
  Administered 2019-12-22: 3 mL via INTRAVENOUS

## 2019-12-22 MED ORDER — HEPARIN SODIUM (PORCINE) 5000 UNIT/ML IJ SOLN
5000.0000 [IU] | Freq: Three times a day (TID) | INTRAMUSCULAR | Status: DC
  Administered 2019-12-22: 5000 [IU] via SUBCUTANEOUS
  Filled 2019-12-22 (×2): qty 1

## 2019-12-22 MED ORDER — AMLODIPINE BESYLATE 5 MG PO TABS
5.0000 mg | ORAL_TABLET | Freq: Every day | ORAL | Status: DC
  Administered 2019-12-23: 5 mg via ORAL
  Filled 2019-12-22: qty 1

## 2019-12-22 MED ORDER — ATORVASTATIN CALCIUM 20 MG PO TABS
20.0000 mg | ORAL_TABLET | Freq: Every day | ORAL | Status: DC
  Administered 2019-12-23: 20 mg via ORAL
  Filled 2019-12-22: qty 1

## 2019-12-22 NOTE — ED Notes (Signed)
Pericare provided and pure-wick placed

## 2019-12-22 NOTE — ED Notes (Signed)
Lab tech was drawing her blood work and had drawn about 2 vials of blood and was talking then went unresponsive. PT was brought in room 2 immediately and triage nurse was doing chest compressions.

## 2019-12-22 NOTE — ED Notes (Signed)
PT now a little confused but is alert and able to answer EDP questions at bedside.

## 2019-12-22 NOTE — H&P (Signed)
TRH H&P   Patient Demographics:    Toni Gomez, is a 86 y.o. female  MRN: [id number]   DOB - 02-09-1924  Admit Date - 12/22/2019  Outpatient Primary MD for the patient is [name], DO  Referring MD/NP/PA: Dr Donney Dice  Patient coming from: Home  Chief Complaint  Patient presents with  . Anorexia      HPI:    Toni Gomez  is a 86 y.o. female, with medical history significant forhypertension, syncope, CKD 3,stroke, with recent hospitalization due to rhabdomyolysis after she has been on the floor for a while, is home by herself, she uses walker/electric wheelchair, but she had her niece/POA lives close by and check on her frequently, brought her to ED secondary to poor appetite for last 3 days, does not want to drink fluids, was feeling weak, has poor appetite, patient herself denies any chest pain, fever, chills, dysuria, polyuria, while patient was in ED waiting room, lab tech was drawing her labs, in the triage area, her 2 bouts of blood were taken, patient has syncopized, which she became unresponsive position where she was sitting in a chair, she had CPR done by nursing staff when she did not respond to them, CPR lasted around 2 minutes, when ED physician was there, patient open her eyes up,, patient woke up without any complaints. - in ED 1.46, chest x-ray with no acute cardiopulmonary process, urinalysis still pending, EKG with evidence of old right bundle branch block, given patient's syncope in ED, he had hospitalist consulted to observe overnight.    Review of systems:    In addition to the HPI above,  No Fever-chills, she presents with poor appetitive, she had syncope in E for a blood draw.. No Headache, No changes with Vision or hearing, No problems swallowing food or Liquids, No Chest pain, Cough or Shortness of Breath, No Abdominal pain, No Nausea or  Vommitting, Bowel movements are regular, No Blood in stool or Urine, No dysuria, No new skin rashes or bruises, No new joints pains-aches, he ambulates with a walker and electric wheelchair. No new weakness, tingling, numbness in any extremity, No recent weight gain or loss, No polyuria, polydypsia or polyphagia, No significant Mental Stressors.  A full 10 point Review of Systems was done, except as stated above, all other Review of Systems were negative.   With Past History of the following :    Past Medical History:  Diagnosis Date  . Elevated troponin 03/26/2018  . Hyperlipidemia   . Hypertension   . Renal disorder    chronic kidney disease  . Rhabdomyolysis   . Stroke [facility])       Past Surgical History:  Procedure Laterality Date  . BREAST SURGERY  1980   R breast removed, not cancer      Social History:     Social History   Tobacco Use  .  Smoking status: Never Smoker  . Smokeless tobacco: Never Used  Substance Use Topics  . Alcohol use: No        Family History :     Family History  Problem Relation Age of Onset  . Hypertension Other   . Hypertension Mother   . Stroke Mother   . Asthma Father      Home Medications:   Prior to Admission medications   Medication Sig Start Date End Date Taking? Authorizing Provider  acetaminophen (TYLENOL) 325 MG tablet Take 2 tablets (650 mg total) by mouth every 6 (six) hours as needed for mild pain (or Fever >/= 101). 11/03/19  Yes Emokpae, Courage, MD  amLODipine (NORVASC) 5 MG tablet Take 1 tablet (5 mg total) by mouth daily. 12/11/19  Yes Ronnie Doss M, DO  aspirin EC 81 MG tablet Take 1 tablet (81 mg total) by mouth daily with breakfast. Swallow whole. 11/03/19  Yes Emokpae, Courage, MD  atorvastatin (LIPITOR) 20 MG tablet Take 1 tablet (20 mg total) by mouth daily. 12/08/19  Yes Gottschalk, Ashly M, DO  azelastine (ASTELIN) 0.1 % nasal spray Place 1 spray into both nostrils 2 (two) times daily. 09/20/19  Yes  Gottschalk, Leatrice Jewels M, DO  mirtazapine (REMERON) 7.5 MG tablet Take 1 tablet (7.5 mg total) by mouth at bedtime. 12/08/19  Yes Ronnie Doss M, DO     Allergies:    No Known Allergies   Physical Exam:   Vitals  Blood pressure (!) 141/60, pulse 82, temperature (!) 97.5 F (36.4 C), temperature source Oral, resp. rate 12, height 5\' 1"  (1.549 m), weight 59 kg, SpO2 95 %.   1. General frail elderly female lying in bed in NAD,    2. She is answering questions appropriately, Oriented X 2.  3. No F.N deficits, ALL C.Nerves Intact, Strength 5/5 all 4 extremities, Sensation intact all 4 extremities, Plantars down going.  4. Ears and Eyes appear Normal, Conjunctivae clear, PERRLA. Moist Oral Mucosa.  5. Supple Neck, No JVD, No cervical lymphadenopathy appriciated, No Carotid Bruits.  6. Symmetrical Chest wall movement, Good air movement bilaterally, CTA, +1 edema.  7. RRR, No Gallops, Rubs or Murmurs, No Parasternal Heave.  8. Positive Bowel Sounds, Abdomen Soft, No tenderness, No organomegaly appriciated,No rebound -guarding or rigidity.  9.  No Cyanosis, Normal Skin Turgor, No Skin Rash or Bruise.  10. Good muscle tone,  joints appear normal , no effusions, Normal ROM.    Data Review:    CBC Recent Labs  Lab 12/22/19 1651  WBC 4.8  HGB 12.4  HCT 39.9  PLT 282  MCV 87.7  MCH 27.3  MCHC 31.1  RDW 15.5   ------------------------------------------------------------------------------------------------------------------  Chemistries  Recent Labs  Lab 12/22/19 1651  NA 139  K 4.0  CL 106  CO2 22  GLUCOSE 83  BUN 26*  CREATININE 1.26*  CALCIUM 10.1  AST 38  ALT 23  ALKPHOS 81  BILITOT 0.6   ------------------------------------------------------------------------------------------------------------------ estimated creatinine clearance is 22.5 mL/min (A) (by C-G formula based on SCr of 1.26 mg/dL  (H)). ------------------------------------------------------------------------------------------------------------------ No results for input(s): TSH, T4TOTAL, T3FREE, THYROIDAB in the last 72 hours.  Invalid input(s): FREET3  Coagulation profile No results for input(s): INR, PROTIME in the last 168 hours. ------------------------------------------------------------------------------------------------------------------- No results for input(s): DDIMER in the last 72 hours. -------------------------------------------------------------------------------------------------------------------  Cardiac Enzymes No results for input(s): CKMB, TROPONINI, MYOGLOBIN in the last 168 hours.  Invalid input(s): CK ------------------------------------------------------------------------------------------------------------------    Component Value Date/Time  BNP 455.0 (H) 03/26/2018 2016     ---------------------------------------------------------------------------------------------------------------  Urinalysis    Component Value Date/Time   COLORURINE YELLOW 11/01/2019 2330   APPEARANCEUR CLEAR 11/01/2019 2330   APPEARANCEUR Clear 01/27/2019 1155   LABSPEC 1.012 11/01/2019 2330   PHURINE 7.0 11/01/2019 2330   GLUCOSEU NEGATIVE 11/01/2019 2330   HGBUR MODERATE (A) 11/01/2019 2330   BILIRUBINUR NEGATIVE 11/01/2019 2330   BILIRUBINUR Negative 01/27/2019 [place] 11/01/2019 2330   PROTEINUR 100 (A) 11/01/2019 2330   NITRITE NEGATIVE 11/01/2019 2330   LEUKOCYTESUR NEGATIVE 11/01/2019 2330    ----------------------------------------------------------------------------------------------------------------   Imaging Results:    DG Chest Port 1 View  Result Date: 12/22/2019 CLINICAL DATA:  Syncope and loss of appetite for 3 days EXAM: PORTABLE CHEST 1 VIEW COMPARISON:  Radiographs 05/02/2018, 03/26/2018 FINDINGS: No consolidation, features of edema, pneumothorax, or effusion.  Pulmonary vascularity is normally distributed. The aorta is calcified. The remaining cardiomediastinal contours are unremarkable. no acute osseous or soft tissue abnormality. Degenerative changes are present in the imaged spine and shoulders. Telemetry leads overlie the chest. IMPRESSION: No acute cardiopulmonary abnormality. Aortic Atherosclerosis (ICD10-I70.0). Electronically Signed   By: Lovena Le M.D.   On: 12/22/2019 17:21    My personal review of EKG: Rhythm NSR, Rate 65 /min, QTc 501, RBBB(old)   Assessment & Plan:    Active Problems:   Hypertension   Syncope and collapse   CKD (chronic kidney disease) stage 3, GFR 30-59 ml/min   Hyperlipidemia   Hypercalcemia   Hyperparathyroidism, primary (HCC)   Syncope   Syncope -This is most likely vasovagal syncope, as it developed after patient blood draw, but given the fact she had CPR done in the waiting room, so she will be admitted for observation overnight, I will continue to monitor her on telemetry, I will obtain 2D echo, will obtain CT head as well. -I will go ahead and check her troponins, and I would expect it to be elevated after she had CPR done, but will continue to trend it and as long it is trending down do not think any interventions need to be done.  Failure to thrive/poor appetite -Main reason patient brought to the hospital was due to failure to thrive and poor appetite, which might be related to age, he had recent TSH, folic acid done within normal limit, her B12 is low normal so I will start on B12 supplement. -She is already on Remeron, I will increase her dose from 7.5 mg to 15 mg. -No evidence of leukocytosis or infection, no acute finding on chest x-ray, I will obtain urinalysis to rule out infection as well. -We will start her on Ensure -Consult PT/OT. -I have discussed with niece/POA, this might be related to advanced age.  HTN - blood pressure is acceptable , continue with Norvasc.  CKD 3 -creatinine 1.26,  baseline 1.2-1.4. - renally adjust medications, avoid nephrotoxic agents -Treated with IV fluids    DVT Prophylaxis Heparin - SCDs   AM Labs Ordered, also please review Full Orders  Family Communication: Admission, patients condition and plan of care including tests being ordered have been discussed with the patient and Niece POA who indicate understanding and agree with the plan and Code Status.  Code Status DNR , confirmed by POA  Likely DC to  Home  Condition GUARDED   Consults called: None  Admission status: Observation  Time spent in minutes : 50 minutes   Phillips Climes M.D on 12/22/2019 at 7:28 PM  [facility] - Office  [phone]

## 2019-12-22 NOTE — ED Triage Notes (Signed)
Pt has lost her appetite x 3 days.  States all she can get down is ensure and is trying to drink water. States she feels really weak. Denies any pain.

## 2019-12-22 NOTE — ED Provider Notes (Signed)
[facility] EMERGENCY DEPARTMENT Provider Note   CSN: [id number] Arrival date & time: 12/22/19  1428     History Chief Complaint  Patient presents with  . Anorexia    Toni Gomez is a 86 y.o. female.  Patient presented with a complaint of loss of appetite for 3 days.  Stating that she is not really having any desire to drink water.  Feeling very weak denies any pain chest pain or headache.  Denies any fevers.  Lab tech was drawing blood work on her out in the triage area and had about 2 vials of blood and was taken when patient became unresponsive.  Patient was sitting in a chair at the time.  Patient ended up having CPR started nurses nurses could not get her to respond.  Not sure how long she had CPR but it was for more than 2 minutes.  Could been as long as 5 minutes.  I got there with about a minute left to CPR being done and then her eyes opened up.  I was not able to feel a pulse other than the pulse with CPR.  Patient when she awoke really had no complaints.  Patient clinically appeared dehydrated.  She once again denied any pain no chest pain no abdominal pain no headache.  No shortness of breath.        Past Medical History:  Diagnosis Date  . Elevated troponin 03/26/2018  . Hyperlipidemia   . Hypertension   . Renal disorder    chronic kidney disease  . Rhabdomyolysis   . Stroke [facility])     Patient Active Problem List   Diagnosis Date Noted  . Hyperparathyroidism, primary ([place]) 07/19/2019  . Recurrent strokes ([place]) 10/29/2018  . Hypercalcemia 10/29/2018  . Allergic conjunctivitis of both eyes 07/25/2018  . Hyperlipidemia 03/31/2018  . History of stroke 03/28/2018  . CKD (chronic kidney disease) stage 3, GFR 30-59 ml/min   . Fall   . Stroke (cerebrum) ([place])   . Abnormal EKG 03/27/2018  . Syncope and collapse 03/27/2018  . Acute encephalopathy 03/27/2018  . Altered mental status 03/26/2018  . Rhabdomyolysis 03/26/2018  . Hypertension 03/26/2018    Past  Surgical History:  Procedure Laterality Date  . BREAST SURGERY  1980   R breast removed, not cancer     OB History   No obstetric history on file.     Family History  Problem Relation Age of Onset  . Hypertension Other   . Hypertension Mother   . Stroke Mother   . Asthma Father     Social History   Tobacco Use  . Smoking status: Never Smoker  . Smokeless tobacco: Never Used  Vaping Use  . Vaping Use: Never used  Substance Use Topics  . Alcohol use: No  . Drug use: No    Home Medications Prior to Admission medications   Medication Sig Start Date End Date Taking? Authorizing Provider  acetaminophen (TYLENOL) 325 MG tablet Take 2 tablets (650 mg total) by mouth every 6 (six) hours as needed for mild pain (or Fever >/= 101). 11/03/19  Yes Emokpae, Courage, MD  amLODipine (NORVASC) 5 MG tablet Take 1 tablet (5 mg total) by mouth daily. 12/11/19  Yes Ronnie Doss M, DO  aspirin EC 81 MG tablet Take 1 tablet (81 mg total) by mouth daily with breakfast. Swallow whole. 11/03/19  Yes Emokpae, Courage, MD  atorvastatin (LIPITOR) 20 MG tablet Take 1 tablet (20 mg total) by mouth daily. 12/08/19  Yes Ronnie Doss M, DO  azelastine (ASTELIN) 0.1 % nasal spray Place 1 spray into both nostrils 2 (two) times daily. 09/20/19  Yes Gottschalk, Leatrice Jewels M, DO  mirtazapine (REMERON) 7.5 MG tablet Take 1 tablet (7.5 mg total) by mouth at bedtime. 12/08/19  Yes Ronnie Doss M, DO    Allergies    Patient has no known allergies.  Review of Systems   Review of Systems  Constitutional: Positive for appetite change and fatigue. Negative for chills and fever.  HENT: Negative for rhinorrhea and sore throat.   Eyes: Negative for visual disturbance.  Respiratory: Negative for cough and shortness of breath.   Cardiovascular: Negative for chest pain and leg swelling.  Gastrointestinal: Negative for abdominal pain, diarrhea, nausea and vomiting.  Genitourinary: Negative for dysuria.    Musculoskeletal: Negative for back pain and neck pain.  Skin: Negative for rash.  Neurological: Positive for weakness. Negative for dizziness, light-headedness and headaches.  Hematological: Does not bruise/bleed easily.  Psychiatric/Behavioral: Negative for confusion.    Physical Exam Updated Vital Signs BP (!) 142/46   Pulse 77   Temp (!) 97.5 F (36.4 C) (Oral)   Resp 14   Ht 1.549 m (5\' 1" )   Wt 59 kg   SpO2 99%   BMI 24.58 kg/m   Physical Exam Vitals and nursing note reviewed.  Constitutional:      General: She is not in acute distress.    Appearance: Normal appearance. She is well-developed.  HENT:     Head: Normocephalic and atraumatic.  Eyes:     Extraocular Movements: Extraocular movements intact.     Conjunctiva/sclera: Conjunctivae normal.     Pupils: Pupils are equal, round, and reactive to light.  Cardiovascular:     Rate and Rhythm: Normal rate and regular rhythm.     Heart sounds: No murmur heard.   Pulmonary:     Effort: Pulmonary effort is normal. No respiratory distress.     Breath sounds: Normal breath sounds.  Abdominal:     Palpations: Abdomen is soft.     Tenderness: There is no abdominal tenderness.  Musculoskeletal:        General: Swelling present.     Cervical back: Normal range of motion and neck supple.  Skin:    General: Skin is warm and dry.     Capillary Refill: Capillary refill takes less than 2 seconds.  Neurological:     General: No focal deficit present.     Mental Status: She is alert and oriented to person, place, and time.     Cranial Nerves: No cranial nerve deficit.     Sensory: No sensory deficit.     Motor: No weakness.     ED Results / Procedures / Treatments   Labs (all labs ordered are listed, but only abnormal results are displayed) Labs Reviewed  BASIC METABOLIC PANEL - Abnormal; Notable for the following components:      Result Value   BUN 26 (*)    Creatinine, Ser 1.26 (*)    GFR calc non Af Amer 36 (*)     GFR calc Af Amer 42 (*)    All other components within normal limits  SARS CORONAVIRUS 2 BY RT PCR (HOSPITAL ORDER, [place] [facility])  CBC  HEPATIC FUNCTION PANEL  LIPASE, BLOOD  URINALYSIS, ROUTINE W REFLEX MICROSCOPIC  CBG MONITORING, ED    EKG EKG Interpretation  Date/Time:  Friday December 22 2019 17:02:54 EDT Ventricular Rate:  65 PR Interval:    QRS Duration: 160 QT Interval:  481 QTC Calculation: 501 R Axis:   32 Text Interpretation: Sinus rhythm Prolonged PR interval Right bundle branch block Anteroseptal infarct, age indeterminate Artifact No significant change since last tracing Confirmed by Fredia Sorrow [phone]) on 12/22/2019 6:04:59 PM   Radiology DG Chest Port 1 View  Result Date: 12/22/2019 CLINICAL DATA:  Syncope and loss of appetite for 3 days EXAM: PORTABLE CHEST 1 VIEW COMPARISON:  Radiographs 05/02/2018, 03/26/2018 FINDINGS: No consolidation, features of edema, pneumothorax, or effusion. Pulmonary vascularity is normally distributed. The aorta is calcified. The remaining cardiomediastinal contours are unremarkable. no acute osseous or soft tissue abnormality. Degenerative changes are present in the imaged spine and shoulders. Telemetry leads overlie the chest. IMPRESSION: No acute cardiopulmonary abnormality. Aortic Atherosclerosis (ICD10-I70.0). Electronically Signed   By: Lovena Le M.D.   On: 12/22/2019 17:21    Procedures Procedures (including critical care time)  CRITICAL CARE Performed by: Fredia Sorrow Total critical care time: 35 minutes Critical care time was exclusive of separately billable procedures and treating other patients. Critical care was necessary to treat or prevent imminent or life-threatening deterioration. Critical care was time spent personally by me on the following activities: development of treatment plan with patient and/or surrogate as well as nursing, discussions with consultants, evaluation of patient's  response to treatment, examination of patient, obtaining history from patient or surrogate, ordering and performing treatments and interventions, ordering and review of laboratory studies, ordering and review of radiographic studies, pulse oximetry and re-evaluation of patient's condition.   Medications Ordered in ED Medications  0.9 %  sodium chloride infusion ( Intravenous New Bag/Given 12/22/19 1755)  sodium chloride flush (NS) 0.9 % injection 3 mL (3 mLs Intravenous Given 12/22/19 1715)  sodium chloride 0.9 % bolus 500 mL (0 mLs Intravenous Stopped 12/22/19 1748)    ED Course  I have reviewed the triage vital signs and the nursing notes.  Pertinent labs & imaging results that were available during my care of the patient were reviewed by me and considered in my medical decision making (see chart for details).    MDM Rules/Calculators/A&P                          Will get patient back to room to get her on the monitors.  Vital signs were all very normal.  Patient without any neurofocal deficit.  Oxygen saturations were fine.  Chest x-ray negative.  BUN and creatinine elevated a little bit with a creatinine 1.26 but sort of baseline for her.  No significant lab abnormalities.  Troponin is still pending.  But patient did have CPR.  EKG without any significant changes from previous.  Did have a right bundle branch pattern.  Had prolonged PR.  No leukocytosis.  No anemia.  Urinalysis is pending 2. Patient did not have any the Covid vaccines Covid testing is pending.  Gust with the hospitalist because of with may have been a vasovagal syncopal episode but since it was somewhat prolonged and she had chest compressions for at least 2 minutes may be as long as 5 I will go ahead and admit her for cardiac monitoring.   Final Clinical Impression(s) / ED Diagnoses Final diagnoses:  Syncope, unspecified syncope type    Rx / DC Orders ED Discharge Orders    None       Fredia Sorrow, MD 12/22/19  1948

## 2019-12-23 ENCOUNTER — Observation Stay (HOSPITAL_BASED_OUTPATIENT_CLINIC_OR_DEPARTMENT_OTHER): Payer: Medicare HMO

## 2019-12-23 DIAGNOSIS — E210 Primary hyperparathyroidism: Secondary | ICD-10-CM | POA: Diagnosis not present

## 2019-12-23 DIAGNOSIS — E7849 Other hyperlipidemia: Secondary | ICD-10-CM | POA: Diagnosis not present

## 2019-12-23 DIAGNOSIS — I350 Nonrheumatic aortic (valve) stenosis: Secondary | ICD-10-CM

## 2019-12-23 DIAGNOSIS — N1832 Chronic kidney disease, stage 3b: Secondary | ICD-10-CM | POA: Diagnosis not present

## 2019-12-23 DIAGNOSIS — I361 Nonrheumatic tricuspid (valve) insufficiency: Secondary | ICD-10-CM

## 2019-12-23 DIAGNOSIS — I351 Nonrheumatic aortic (valve) insufficiency: Secondary | ICD-10-CM | POA: Diagnosis not present

## 2019-12-23 DIAGNOSIS — I10 Essential (primary) hypertension: Secondary | ICD-10-CM | POA: Diagnosis not present

## 2019-12-23 DIAGNOSIS — R55 Syncope and collapse: Secondary | ICD-10-CM | POA: Diagnosis not present

## 2019-12-23 LAB — ECHOCARDIOGRAM COMPLETE
AR max vel: 1.6 cm2
AV Area VTI: 1.55 cm2
AV Area mean vel: 1.69 cm2
AV Mean grad: 11 mmHg
AV Peak grad: 19.4 mmHg
Ao pk vel: 2.2 m/s
Area-P 1/2: 3.15 cm2
Height: 61 in
P 1/2 time: 551 msec
S' Lateral: 2.14 cm
Weight: 2102.31 oz

## 2019-12-23 LAB — BASIC METABOLIC PANEL
Anion gap: 10 (ref 5–15)
BUN: 25 mg/dL — ABNORMAL HIGH (ref 8–23)
CO2: 22 mmol/L (ref 22–32)
Calcium: 9.7 mg/dL (ref 8.9–10.3)
Chloride: 109 mmol/L (ref 98–111)
Creatinine, Ser: 1.16 mg/dL — ABNORMAL HIGH (ref 0.44–1.00)
GFR calc Af Amer: 47 mL/min — ABNORMAL LOW (ref >60)
GFR calc non Af Amer: 40 mL/min — ABNORMAL LOW (ref >60)
Glucose, Bld: 74 mg/dL (ref 70–99)
Potassium: 3.8 mmol/L (ref 3.5–5.1)
Sodium: 141 mmol/L (ref 135–145)

## 2019-12-23 LAB — CBC
HCT: 36.8 % (ref 36.0–46.0)
Hemoglobin: 11.3 g/dL — ABNORMAL LOW (ref 12.0–15.0)
MCH: 27 pg (ref 26.0–34.0)
MCHC: 30.7 g/dL (ref 30.0–36.0)
MCV: 87.8 fL (ref 80.0–100.0)
Platelets: 269 10*3/uL (ref 150–400)
RBC: 4.19 MIL/uL (ref 3.87–5.11)
RDW: 15.5 % (ref 11.5–15.5)
WBC: 4.6 10*3/uL (ref 4.0–10.5)
nRBC: 0 % (ref 0.0–0.2)

## 2019-12-23 LAB — GLUCOSE, CAPILLARY: Glucose-Capillary: 70 mg/dL (ref 70–99)

## 2019-12-23 LAB — TROPONIN I (HIGH SENSITIVITY): Troponin I (High Sensitivity): 22 ng/L — ABNORMAL HIGH (ref <18)

## 2019-12-23 MED ORDER — ACETAMINOPHEN 325 MG PO TABS
650.0000 mg | ORAL_TABLET | Freq: Four times a day (QID) | ORAL | Status: DC
  Administered 2019-12-23: 650 mg via ORAL
  Filled 2019-12-23: qty 2

## 2019-12-23 MED ORDER — ENSURE ENLIVE PO LIQD: 237.0000 mL | Freq: Two times a day (BID) | ORAL | 1 refills | Status: AC

## 2019-12-23 MED ORDER — ACETAMINOPHEN 325 MG PO TABS: 650.0000 mg | ORAL_TABLET | Freq: Four times a day (QID) | ORAL | 0 refills | Status: AC

## 2019-12-23 MED ORDER — ACETAMINOPHEN 650 MG RE SUPP: 650.0000 mg | Freq: Four times a day (QID) | RECTAL | Status: DC

## 2019-12-23 MED ORDER — VITAMIN B-12 1000 MCG PO TABS
1000.0000 ug | ORAL_TABLET | Freq: Every day | ORAL | 3 refills | Status: DC
Start: 2019-12-23 — End: 2020-04-25

## 2019-12-23 NOTE — Progress Notes (Signed)
*  PRELIMINARY RESULTS* Echocardiogram 2D Echocardiogram has been performed.  Toni Gomez 12/23/2019, 12:11 PM

## 2019-12-23 NOTE — Discharge Summary (Addendum)
Physician Discharge Summary  Toni Gomez:[id number] DOB: 1924-03-18 DOA: 12/22/2019  PCP: [name], DO  Admit date: 12/22/2019 Discharge date: 12/23/2019  Admitted From:  Home  Disposition:  Home with Home health services  Recommendations for Outpatient Follow-up:  1. Follow up with PCP in 1 weeks  Home Health:  PT, RN, Aide, SW  Discharge Condition: STABLE   CODE STATUS: DNR    Brief Hospitalization Summary: Please see all hospital notes, images, labs for full details of the hospitalization. ADMISSION HPI:  Toni Gomez  is a 86 y.o. female, with medical history significant forhypertension, syncope, CKD 3,stroke, with recent hospitalization due to rhabdomyolysis after she has been on the floor for a while, is home by herself, she uses walker/electric wheelchair, but she had her niece/POA lives close by and check on her frequently, brought her to ED secondary to poor appetite for last 3 days, does not want to drink fluids, was feeling weak, has poor appetite, patient herself denies any chest pain, fever, chills, dysuria, polyuria, while patient was in ED waiting room, lab tech was drawing her labs, in the triage area, her 2 bouts of blood were taken, patient has syncopized, which she became unresponsive position where she was sitting in a chair, she had CPR done by nursing staff when she did not respond to them, CPR lasted around 2 minutes, when ED physician was there, patient open her eyes up,, patient woke up without any complaints.  - in ED 1.46, chest x-ray with no acute cardiopulmonary process, urinalysis still pending, EKG with evidence of old right bundle branch block, given patient's syncope in ED, he had hospitalist consulted to observe overnight.     Vasovagal Syncope -This was vasovagal syncope, as it developed after patient blood draw, but given the fact she had CPR done in the waiting room, so she will be admitted for observation overnight, I will continue to  monitor her on telemetry, 2D echo pending CT head negative for acute findings.  -HS troponins negative  Failure to thrive/poor appetite -Main reason patient brought to the hospital was due to failure to thrive and poor appetite, which might be related to age, he had recent TSH, folic acid done within normal limit, her B12 is low normal so will start B12 supplement. -She is already on Remeron, I will increase her dose from 7.5 mg to 15 mg. -No evidence of leukocytosis or infection, no acute finding on chest x-ray, urinalysis  ruled out infection. -We will start her on Ensure -I have discussed with niece/POA, this might be related to advanced age. -I encouraged patient to get vaccinated for Covid 19.   HTN - blood pressure is acceptable , continue with Norvasc.  AKI on CKD 3 -creatinine improved to 1.16 after hydration,baseline 1.2-1.4. - renally adjust medications, avoid nephrotoxic agents -Treated with IV fluids  Osteoarthritis - schedule tylenol 650 every 6 hours   DVT Prophylaxis Heparin - SCDs   AM Labs Ordered, also please review Full Orders  Discharge Diagnoses:  Active Problems:   Hypertension   Syncope and collapse   CKD (chronic kidney disease) stage 3, GFR 30-59 ml/min   Hyperlipidemia   Hypercalcemia   Hyperparathyroidism, primary ([place])   Syncope   Discharge Instructions:  Allergies as of 12/23/2019   No Known Allergies     Medication List    TAKE these medications   acetaminophen 325 MG tablet Commonly known as: TYLENOL Take 2 tablets (650 mg total) by mouth every 6 (  six) hours. What changed:   when to take this  reasons to take this   amLODipine 5 MG tablet Commonly known as: NORVASC Take 1 tablet (5 mg total) by mouth daily.   aspirin EC 81 MG tablet Take 1 tablet (81 mg total) by mouth daily with breakfast. Swallow whole.   atorvastatin 20 MG tablet Commonly known as: LIPITOR Take 1 tablet (20 mg total) by mouth daily.    azelastine 0.1 % nasal spray Commonly known as: ASTELIN Place 1 spray into both nostrils 2 (two) times daily.   feeding supplement (ENSURE ENLIVE) Liqd Take 237 mLs by mouth 2 (two) times daily between meals.   mirtazapine 7.5 MG tablet Commonly known as: REMERON Take 1 tablet (7.5 mg total) by mouth at bedtime.   vitamin B-12 1000 MCG tablet Commonly known as: CYANOCOBALAMIN Take 1 tablet (1,000 mcg total) by mouth daily.            Durable Medical Equipment  (From admission, onward)         Start     Ordered   12/23/19 1003  For home use only DME standard manual wheelchair with seat cushion  Once       Comments: Patient suffers from severe osteoarthritis with gait instability which impairs their ability to perform daily activities like bathing, dressing, feeding, grooming, and toileting in the home.  A cane, crutch, or walker will not resolve issue with performing activities of daily living. A wheelchair will allow patient to safely perform daily activities. Patient can safely propel the wheelchair in the home or has a caregiver who can provide assistance. Length of need Lifetime. Accessories: elevating leg rests (ELRs), wheel locks, extensions and anti-tippers.   12/23/19 1003          Follow-up Information    [name], DO. Schedule an appointment as soon as possible for a visit in 1 week(s).   Specialty: Family Medicine Contact information: [place] [address] [phone]        Herminio Commons, MD .   Specialty: Cardiology Contact information: [place] [address] [phone]              No Known Allergies Allergies as of 12/23/2019   No Known Allergies     Medication List    TAKE these medications   acetaminophen 325 MG tablet Commonly known as: TYLENOL Take 2 tablets (650 mg total) by mouth every 6 (six) hours. What changed:   when to take this  reasons to take this   amLODipine 5 MG  tablet Commonly known as: NORVASC Take 1 tablet (5 mg total) by mouth daily.   aspirin EC 81 MG tablet Take 1 tablet (81 mg total) by mouth daily with breakfast. Swallow whole.   atorvastatin 20 MG tablet Commonly known as: LIPITOR Take 1 tablet (20 mg total) by mouth daily.   azelastine 0.1 % nasal spray Commonly known as: ASTELIN Place 1 spray into both nostrils 2 (two) times daily.   feeding supplement (ENSURE ENLIVE) Liqd Take 237 mLs by mouth 2 (two) times daily between meals.   mirtazapine 7.5 MG tablet Commonly known as: REMERON Take 1 tablet (7.5 mg total) by mouth at bedtime.   vitamin B-12 1000 MCG tablet Commonly known as: CYANOCOBALAMIN Take 1 tablet (1,000 mcg total) by mouth daily.            Durable Medical Equipment  (From admission, onward)  Start     Ordered   12/23/19 1003  For home use only DME standard manual wheelchair with seat cushion  Once       Comments: Patient suffers from severe osteoarthritis with gait instability which impairs their ability to perform daily activities like bathing, dressing, feeding, grooming, and toileting in the home.  A cane, crutch, or walker will not resolve issue with performing activities of daily living. A wheelchair will allow patient to safely perform daily activities. Patient can safely propel the wheelchair in the home or has a caregiver who can provide assistance. Length of need Lifetime. Accessories: elevating leg rests (ELRs), wheel locks, extensions and anti-tippers.   12/23/19 1003          Procedures/Studies: CT HEAD WO CONTRAST  Result Date: 12/22/2019 CLINICAL DATA:  Syncope.  Normal neurological examination. EXAM: CT HEAD WITHOUT CONTRAST TECHNIQUE: Contiguous axial images were obtained from the base of the skull through the vertex without intravenous contrast. COMPARISON:  02/18/2019 FINDINGS: Brain: Diffuse cerebral atrophy. Low-attenuation changes in the deep white matter consistent with  small vessel ischemic change. Mild ventricular dilatation consistent with central atrophy. There is a focal area of low-attenuation in the right frontal region, unchanged since prior study, likely an old infarct. No abnormal extra-axial fluid collections. No mass effect or midline shift. Gray-white matter junctions are distinct. Basal cisterns are not effaced. No acute intracranial hemorrhage. Vascular: No hyperdense vessel or unexpected calcification. Skull: Normal. Negative for fracture or focal lesion. Sinuses/Orbits: No acute finding. Other: None. IMPRESSION: 1. No acute intracranial abnormalities. 2. Chronic atrophy and small vessel ischemic changes. Old right frontal infarct. Electronically Signed   By: Lucienne Capers M.D.   On: 12/22/2019 20:48   DG Chest Port 1 View  Result Date: 12/22/2019 CLINICAL DATA:  Syncope and loss of appetite for 3 days EXAM: PORTABLE CHEST 1 VIEW COMPARISON:  Radiographs 05/02/2018, 03/26/2018 FINDINGS: No consolidation, features of edema, pneumothorax, or effusion. Pulmonary vascularity is normally distributed. The aorta is calcified. The remaining cardiomediastinal contours are unremarkable. no acute osseous or soft tissue abnormality. Degenerative changes are present in the imaged spine and shoulders. Telemetry leads overlie the chest. IMPRESSION: No acute cardiopulmonary abnormality. Aortic Atherosclerosis (ICD10-I70.0). Electronically Signed   By: Lovena Le M.D.   On: 12/22/2019 17:21     Subjective: Pt says that her body feels sore all over from the CPR yesterday.  She wants to get home.  She is unvaccinated for Covid 19.    Discharge Exam: Vitals:   12/23/19 0538 12/23/19 0903  BP: (!) 146/56 139/66  Pulse: 70   Resp: 17   Temp: 98.3 F (36.8 C)   SpO2: 99%    Vitals:   12/23/19 0113 12/23/19 0538 12/23/19 0539 12/23/19 0903  BP: (!) 134/51 (!) 146/56  139/66  Pulse: (!) 55 70    Resp: 17 17    Temp: 97.7 F (36.5 C) 98.3 F (36.8 C)     TempSrc:      SpO2: 100% 99%    Weight:   59.6 kg   Height:       General: thin, frail, elderly femaile, alert, awake, not in acute distress Cardiovascular: RRR, S1/S2 +, no rubs, no gallops Respiratory: CTA bilaterally, no wheezing, no rhonchi Abdominal: Soft, NT, ND, bowel sounds + Extremities: no edema, no cyanosis   The results of significant diagnostics from this hospitalization (including imaging, microbiology, ancillary and laboratory) are listed below for reference.     Microbiology: Recent  Results (from the past 240 hour(s))  SARS Coronavirus 2 by RT PCR (hospital order, performed in [facility] hospital lab) Nasopharyngeal Nasopharyngeal Swab     Status: None   Collection Time: 12/22/19  7:30 PM   Specimen: Nasopharyngeal Swab  Result Value Ref Range Status   SARS Coronavirus 2 NEGATIVE NEGATIVE Final    Comment: (NOTE) SARS-CoV-2 target nucleic acids are NOT DETECTED.  The SARS-CoV-2 RNA is generally detectable in upper and lower respiratory specimens during the acute phase of infection. The lowest concentration of SARS-CoV-2 viral copies this assay can detect is 250 copies / mL. A negative result does not preclude SARS-CoV-2 infection and should not be used as the sole basis for treatment or other patient management decisions.  A negative result may occur with improper specimen collection / handling, submission of specimen other than nasopharyngeal swab, presence of viral mutation(s) within the areas targeted by this assay, and inadequate number of viral copies (<250 copies / mL). A negative result must be combined with clinical observations, patient history, and epidemiological information.  Fact Sheet for Patients:   StrictlyIdeas.no  Fact Sheet for Healthcare Providers: BankingDealers.co.za  This test is not yet approved or  cleared by the [place] FDA and has been authorized for detection and/or diagnosis  of SARS-CoV-2 by FDA under an Emergency Use Authorization (EUA).  This EUA will remain in effect (meaning this test can be used) for the duration of the COVID-19 declaration under Section 564(b)(1) of the Act, 21 U.S.C. section 360bbb-3(b)(1), unless the authorization is terminated or revoked sooner.  Performed at [facility], [address] [place], [place] [address]      Labs: BNP (last 3 results) No results for input(s): BNP in the last 8760 hours. Basic Metabolic Panel: Recent Labs  Lab 12/22/19 1651 12/23/19 0641  NA 139 141  K 4.0 3.8  CL 106 109  CO2 22 22  GLUCOSE 83 74  BUN 26* 25*  CREATININE 1.26* 1.16*  CALCIUM 10.1 9.7   Liver Function Tests: Recent Labs  Lab 12/22/19 1651  AST 38  ALT 23  ALKPHOS 81  BILITOT 0.6  PROT 6.8  ALBUMIN 3.9   Recent Labs  Lab 12/22/19 1651  LIPASE 29   No results for input(s): AMMONIA in the last 168 hours. CBC: Recent Labs  Lab 12/22/19 1651 12/23/19 0641  WBC 4.8 4.6  HGB 12.4 11.3*  HCT 39.9 36.8  MCV 87.7 87.8  PLT 282 269   Cardiac Enzymes: No results for input(s): CKTOTAL, CKMB, CKMBINDEX, TROPONINI in the last 168 hours. BNP: Invalid input(s): POCBNP CBG: Recent Labs  Lab 12/22/19 1657 12/23/19 0801  GLUCAP 95 70   D-Dimer No results for input(s): DDIMER in the last 72 hours. Hgb A1c No results for input(s): HGBA1C in the last 72 hours. Lipid Profile No results for input(s): CHOL, HDL, LDLCALC, TRIG, CHOLHDL, LDLDIRECT in the last 72 hours. Thyroid function studies No results for input(s): TSH, T4TOTAL, T3FREE, THYROIDAB in the last 72 hours.  Invalid input(s): FREET3 Anemia work up No results for input(s): VITAMINB12, FOLATE, FERRITIN, TIBC, IRON, RETICCTPCT in the last 72 hours. Urinalysis    Component Value Date/Time   COLORURINE STRAW (A) 12/22/2019 1949   APPEARANCEUR CLEAR 12/22/2019 1949   APPEARANCEUR Clear 01/27/2019 1155   LABSPEC 1.010 12/22/2019 1949   PHURINE 6.0  12/22/2019 1949   GLUCOSEU NEGATIVE 12/22/2019 1949   HGBUR NEGATIVE 12/22/2019 1949   BILIRUBINUR NEGATIVE 12/22/2019 1949   BILIRUBINUR Negative  01/27/2019 1155   KETONESUR 5 (A) 12/22/2019 1949   PROTEINUR 100 (A) 12/22/2019 1949   NITRITE NEGATIVE 12/22/2019 1949   LEUKOCYTESUR NEGATIVE 12/22/2019 1949   Sepsis Labs Invalid input(s): PROCALCITONIN,  WBC,  LACTICIDVEN Microbiology Recent Results (from the past 240 hour(s))  SARS Coronavirus 2 by RT PCR (hospital order, performed in [facility] hospital lab) Nasopharyngeal Nasopharyngeal Swab     Status: None   Collection Time: 12/22/19  7:30 PM   Specimen: Nasopharyngeal Swab  Result Value Ref Range Status   SARS Coronavirus 2 NEGATIVE NEGATIVE Final    Comment: (NOTE) SARS-CoV-2 target nucleic acids are NOT DETECTED.  The SARS-CoV-2 RNA is generally detectable in upper and lower respiratory specimens during the acute phase of infection. The lowest concentration of SARS-CoV-2 viral copies this assay can detect is 250 copies / mL. A negative result does not preclude SARS-CoV-2 infection and should not be used as the sole basis for treatment or other patient management decisions.  A negative result may occur with improper specimen collection / handling, submission of specimen other than nasopharyngeal swab, presence of viral mutation(s) within the areas targeted by this assay, and inadequate number of viral copies (<250 copies / mL). A negative result must be combined with clinical observations, patient history, and epidemiological information.  Fact Sheet for Patients:   StrictlyIdeas.no  Fact Sheet for Healthcare Providers: BankingDealers.co.za  This test is not yet approved or  cleared by the [place] FDA and has been authorized for detection and/or diagnosis of SARS-CoV-2 by FDA under an Emergency Use Authorization (EUA).  This EUA will remain in effect (meaning this  test can be used) for the duration of the COVID-19 declaration under Section 564(b)(1) of the Act, 21 U.S.C. section 360bbb-3(b)(1), unless the authorization is terminated or revoked sooner.  Performed at [facility], [address] [place], [place] [address]    Time coordinating discharge:   SIGNED:  Irwin Brakeman, MD  [facility] 12/23/2019, 10:57 AM How to contact the [facility] Attending or Consulting provider [place] or covering provider during after hours [place], for this patient?  1. Check the care team in [facility] and look for a) attending/consulting [facility] provider listed and b) the [facility] team listed 2. Log into [email or web address] and use [place]'s universal password to access. If you do not have the password, please contact the hospital operator. 3. Locate the [facility] provider you are looking for under [facility] and page to a number that you can be directly reached. 4. If you still have difficulty reaching the provider, please page the [facility] (Director on Call) for the Hospitalists listed on amion for assistance.

## 2019-12-23 NOTE — Discharge Instructions (Signed)
Fall Prevention in the Home, Adult Falls can cause injuries. They can happen to people of all ages. There are many things you can do to make your home safe and to help prevent falls. Ask for help when making these changes, if needed. What actions can I take to prevent falls? General Instructions  Use good lighting in all rooms. Replace any light bulbs that burn out.  Turn on the lights when you go into a dark area. Use night-lights.  Keep items that you use often in easy-to-reach places. Lower the shelves around your home if necessary.  Set up your furniture so you have a clear path. Avoid moving your furniture around.  Do not have throw rugs and other things on the floor that can make you trip.  Avoid walking on wet floors.  If any of your floors are uneven, fix them.  Add color or contrast paint or tape to clearly mark and help you see: ? Any grab bars or handrails. ? First and last steps of stairways. ? Where the edge of each step is.  If you use a stepladder: ? Make sure that it is fully opened. Do not climb a closed stepladder. ? Make sure that both sides of the stepladder are locked into place. ? Ask someone to hold the stepladder for you while you use it.  If there are any pets around you, be aware of where they are. What can I do in the bathroom?      Keep the floor dry. Clean up any water that spills onto the floor as soon as it happens.  Remove soap buildup in the tub or shower regularly.  Use non-skid mats or decals on the floor of the tub or shower.  Attach bath mats securely with double-sided, non-slip rug tape.  If you need to sit down in the shower, use a plastic, non-slip stool.  Install grab bars by the toilet and in the tub and shower. Do not use towel bars as grab bars. What can I do in the bedroom?  Make sure that you have a light by your bed that is easy to reach.  Do not use any sheets or blankets that are too big for your bed. They should not  hang down onto the floor.  Have a firm chair that has side arms. You can use this for support while you get dressed. What can I do in the kitchen?  Clean up any spills right away.  If you need to reach something above you, use a strong step stool that has a grab bar.  Keep electrical cords out of the way.  Do not use floor polish or wax that makes floors slippery. If you must use wax, use non-skid floor wax. What can I do with my stairs?  Do not leave any items on the stairs.  Make sure that you have a light switch at the top of the stairs and the bottom of the stairs. If you do not have them, ask someone to add them for you.  Make sure that there are handrails on both sides of the stairs, and use them. Fix handrails that are broken or loose. Make sure that handrails are as long as the stairways.  Install non-slip stair treads on all stairs in your home.  Avoid having throw rugs at the top or bottom of the stairs. If you do have throw rugs, attach them to the floor with carpet tape.  Choose a carpet that does   not hide the edge of the steps on the stairway.  Check any carpeting to make sure that it is firmly attached to the stairs. Fix any carpet that is loose or worn. What can I do on the outside of my home?  Use bright outdoor lighting.  Regularly fix the edges of walkways and driveways and fix any cracks.  Remove anything that might make you trip as you walk through a door, such as a raised step or threshold.  Trim any bushes or trees on the path to your home.  Regularly check to see if handrails are loose or broken. Make sure that both sides of any steps have handrails.  Install guardrails along the edges of any raised decks and porches.  Clear walking paths of anything that might make someone trip, such as tools or rocks.  Have any leaves, snow, or ice cleared regularly.  Use sand or salt on walking paths during winter.  Clean up any spills in your garage right  away. This includes grease or oil spills. What other actions can I take?  Wear shoes that: ? Have a low heel. Do not wear high heels. ? Have rubber bottoms. ? Are comfortable and fit you well. ? Are closed at the toe. Do not wear open-toe sandals.  Use tools that help you move around (mobility aids) if they are needed. These include: ? Canes. ? Walkers. ? Scooters. ? Crutches.  Review your medicines with your doctor. Some medicines can make you feel dizzy. This can increase your chance of falling. Ask your doctor what other things you can do to help prevent falls. Where to find more information  Centers for Disease Control and Prevention, STEADI: https://garcia.biz/  [employer] on Aging: BrainJudge.co.uk Contact a doctor if:  You are afraid of falling at home.  You feel weak, drowsy, or dizzy at home.  You fall at home. Summary  There are many simple things that you can do to make your home safe and to help prevent falls.  Ways to make your home safe include removing tripping hazards and installing grab bars in the bathroom.  Ask for help when making these changes in your home. This information is not intended to replace advice given to you by your health care provider. Make sure you discuss any questions you have with your health care provider. Document Revised: 08/25/2018 Document Reviewed: 12/17/2016 Elsevier Patient Education  [place].    Dehydration, Adult Dehydration is condition in which there is not enough water or other fluids in the body. This happens when a person loses more fluids than he or she takes in. Important body parts cannot work right without the right amount of fluids. Any loss of fluids from the body can cause dehydration. Dehydration can be mild, worse, or very bad. It should be treated right away to keep it from getting very bad. What are the causes? This condition may be caused by:  Conditions that cause loss of water  or other fluids, such as: ? Watery poop (diarrhea). ? Vomiting. ? Sweating a lot. ? Peeing (urinating) a lot.  Not drinking enough fluids, especially when you: ? Are ill. ? Are doing things that take a lot of energy to do.  Other illnesses and conditions, such as fever or infection.  Certain medicines, such as medicines that take extra fluid out of the body (diuretics).  Lack of safe drinking water.  Not being able to get enough water and food. What increases the  risk? The following factors may make you more likely to develop this condition:  Having a long-term (chronic) illness that has not been treated the right way, such as: ? Diabetes. ? Heart disease. ? Kidney disease.  Being 83 years of age or older.  Having a disability.  Living in a place that is high above the ground or sea (high in altitude). The thinner, dried air causes more fluid loss.  Doing exercises that put stress on your body for a long time. What are the signs or symptoms? Symptoms of dehydration depend on how bad it is. Mild or worse dehydration  Thirst.  Dry lips or dry mouth.  Feeling dizzy or light-headed, especially when you stand up from sitting.  Muscle cramps.  Your body making: ? Dark pee (urine). Pee may be the color of tea. ? Less pee than normal. ? Less tears than normal.  Headache. Very bad dehydration  Changes in skin. Skin may: ? Be cold to the touch (clammy). ? Be blotchy or pale. ? Not go back to normal right after you lightly pinch it and let it go.  Little or no tears, pee, or sweat.  Changes in vital signs, such as: ? Fast breathing. ? Low blood pressure. ? Weak pulse. ? Pulse that is more than 100 beats a minute when you are sitting still.  Other changes, such as: ? Feeling very thirsty. ? Eyes that look hollow (sunken). ? Cold hands and feet. ? Being mixed up (confused). ? Being very tired (lethargic) or having trouble waking from sleep. ? Short-term  weight loss. ? Loss of consciousness. How is this treated? Treatment for this condition depends on how bad it is. Treatment should start right away. Do not wait until your condition gets very bad. Very bad dehydration is an emergency. You will need to go to a hospital.  Mild or worse dehydration can be treated at home. You may be asked to: ? Drink more fluids. ? Drink an oral rehydration solution (ORS). This drink helps get the right amounts of fluids and salts and minerals in the blood (electrolytes).  Very bad dehydration can be treated: ? With fluids through an IV tube. ? By getting normal levels of salts and minerals in your blood. This is often done by giving salts and minerals through a tube. The tube is passed through your nose and into your stomach. ? By treating the root cause. Follow these instructions at home: Oral rehydration solution If told by your doctor, drink an ORS:  Make an ORS. Use instructions on the package.  Start by drinking small amounts, about  cup (120 mL) every 5-10 minutes.  Slowly drink more until you have had the amount that your doctor said to have. Eating and drinking         Drink enough clear fluid to keep your pee pale yellow. If you were told to drink an ORS, finish the ORS first. Then, start slowly drinking other clear fluids. Drink fluids such as: ? Water. Do not drink only water. Doing that can make the salt (sodium) level in your body get too low. ? Water from ice chips you suck on. ? Fruit juice that you have added water to (diluted). ? Low-calorie sports drinks.  Eat foods that have the right amounts of salts and minerals, such as: ? Bananas. ? Oranges. ? Potatoes. ? Tomatoes. ? Spinach.  Do not drink alcohol.  Avoid: ? Drinks that have a lot of sugar. These include:  High-calorie sports drinks.  Fruit juice that you did not add water to.  Soda.  Caffeine. ? Foods that are greasy or have a lot of fat or sugar. General  instructions  Take over-the-counter and prescription medicines only as told by your doctor.  Do not take salt tablets. Doing that can make the salt level in your body get too high.  Return to your normal activities as told by your doctor. Ask your doctor what activities are safe for you.  Keep all follow-up visits as told by your doctor. This is important. Contact a doctor if:  You have pain in your belly (abdomen) and the pain: ? Gets worse. ? Stays in one place.  You have a rash.  You have a stiff neck.  You get angry or annoyed (irritable) more easily than normal.  You are more tired or have a harder time waking than normal.  You feel: ? Weak or dizzy. ? Very thirsty. Get help right away if you have:  Any symptoms of very bad dehydration.  Symptoms of vomiting, such as: ? You cannot eat or drink without vomiting. ? Your vomiting gets worse or does not go away. ? Your vomit has blood or green stuff in it.  Symptoms that get worse with treatment.  A fever.  A very bad headache.  Problems with peeing or pooping (having a bowel movement), such as: ? Watery poop that gets worse or does not go away. ? Blood in your poop (stool). This may cause poop to look black and tarry. ? Not peeing in 6-8 hours. ? Peeing only a small amount of very dark pee in 6-8 hours.  Trouble breathing. These symptoms may be an emergency. Do not wait to see if the symptoms will go away. Get medical help right away. Call your local emergency services (911 in the [place] Do not drive yourself to the hospital. Summary  Dehydration is a condition in which there is not enough water or other fluids in the body. This happens when a person loses more fluids than he or she takes in.  Treatment for this condition depends on how bad it is. Treatment should be started right away. Do not wait until your condition gets very bad.  Drink enough clear fluid to keep your pee pale yellow. If you were told to  drink an oral rehydration solution (ORS), finish the ORS first. Then, start slowly drinking other clear fluids.  Take over-the-counter and prescription medicines only as told by your doctor.  Get help right away if you have any symptoms of very bad dehydration. This information is not intended to replace advice given to you by your health care provider. Make sure you discuss any questions you have with your health care provider. Document Revised: 12/15/2018 Document Reviewed: 12/15/2018 Elsevier Patient Education  [place].

## 2019-12-23 NOTE — TOC Transition Note (Signed)
Transition of Care [facility]) - CM/SW Discharge Note   Patient Details  Name: Toni Gomez MRN: [id number] Date of Birth: [birth year]  Transition of Care [facility]) CM/SW Contact:  Natasha Bence, LCSW Phone Number: 12/23/2019, 11:56 AM   Clinical Narrative:    CSW contacted Georgina Snell with [facility] to inquire about ability to provide HHPT, HHRN, and [place] aid services for patient. [facility] agreeable to provide services for patient. TOC signing off.          Patient Goals and CMS Choice        Discharge Placement                       Discharge Plan and Services                                     Social Determinants of Health (SDOH) Interventions     Readmission Risk Interventions No flowsheet data found.

## 2019-12-23 NOTE — Progress Notes (Signed)
Nsg Discharge Note  Admit Date:  12/22/2019 Discharge date: 12/23/2019   Toni Gomez to be D/C'd Home per MD order.  AVS completed.  Copy for chart, and copy for patient signed, and dated. Patient/caregiver able to verbalize understanding. IV removed per order   Discharge Medication: Allergies as of 12/23/2019   No Known Allergies     Medication List    TAKE these medications   acetaminophen 325 MG tablet Commonly known as: TYLENOL Take 2 tablets (650 mg total) by mouth every 6 (six) hours. What changed:   when to take this  reasons to take this   amLODipine 5 MG tablet Commonly known as: NORVASC Take 1 tablet (5 mg total) by mouth daily.   aspirin EC 81 MG tablet Take 1 tablet (81 mg total) by mouth daily with breakfast. Swallow whole.   atorvastatin 20 MG tablet Commonly known as: LIPITOR Take 1 tablet (20 mg total) by mouth daily.   azelastine 0.1 % nasal spray Commonly known as: ASTELIN Place 1 spray into both nostrils 2 (two) times daily.   feeding supplement (ENSURE ENLIVE) Liqd Take 237 mLs by mouth 2 (two) times daily between meals.   mirtazapine 7.5 MG tablet Commonly known as: REMERON Take 1 tablet (7.5 mg total) by mouth at bedtime.   vitamin B-12 1000 MCG tablet Commonly known as: CYANOCOBALAMIN Take 1 tablet (1,000 mcg total) by mouth daily.            Durable Medical Equipment  (From admission, onward)         Start     Ordered   12/23/19 1003  For home use only DME standard manual wheelchair with seat cushion  Once       Comments: Patient suffers from severe osteoarthritis with gait instability which impairs their ability to perform daily activities like bathing, dressing, feeding, grooming, and toileting in the home.  A cane, crutch, or walker will not resolve issue with performing activities of daily living. A wheelchair will allow patient to safely perform daily activities. Patient can safely propel the wheelchair in the home or has a  caregiver who can provide assistance. Length of need Lifetime. Accessories: elevating leg rests (ELRs), wheel locks, extensions and anti-tippers.   12/23/19 1003          Discharge Assessment: Vitals:   12/23/19 0538 12/23/19 0903  BP: (!) 146/56 139/66  Pulse: 70   Resp: 17   Temp: 98.3 F (36.8 C)   SpO2: 99%    Skin clean, dry and intact without evidence of skin break down, no evidence of skin tears noted. IV catheter discontinued intact. Site without signs and symptoms of complications - no redness or edema noted at insertion site, patient denies c/o pain - only slight tenderness at site.  Dressing with slight pressure applied.  D/c Instructions-Education: Discharge instructions given to patient/family with verbalized understanding. D/c education completed with patient/family including follow up instructions, medication list, d/c activities limitations if indicated, with other d/c instructions as indicated by MD - patient able to verbalize understanding, all questions fully answered. Patient instructed to return to ED, call 911, or call MD for any changes in condition.  Patient escorted via [place], and D/C home via private auto.  Zenaida Deed, RN 12/23/2019 12:49 PM

## 2019-12-23 NOTE — Evaluation (Signed)
Physical Therapy Evaluation Patient Details Name: Toni Gomez MRN: [id number] DOB: 03/22/1924 Today's Date: 12/23/2019   History of Present Illness  Toni Gomez  is a 86 y.o. female, with medical history significant for hypertension, syncope, CKD 3, stroke, with recent hospitalization due to rhabdomyolysis after she has been on the floor for a while, is home by herself, she uses walker/electric wheelchair, but she had her niece/POA lives close by and check on her frequently, brought her to ED secondary to poor appetite for last 3 days, does not want to drink fluids, was feeling weak, has poor appetite, patient herself denies any chest pain, fever, chills, dysuria, polyuria, while patient was in ED waiting room, lab tech was drawing her labs, in the triage area, her 2 bouts of blood were taken, patient has syncopized, which she became unresponsive position where she was sitting in a chair, she had CPR done by nursing staff when she did not respond to them, CPR lasted around 2 minutes, when ED physician was there, patient open her eyes up,, patient woke up without any complaints.  Clinical Impression  Per family pt is at prior level of functioning    Follow Up Recommendations No PT follow up    Equipment Recommendations    RW   Recommendations for Other Services   none    Precautions / Restrictions Precautions Precautions: Fall Restrictions Weight Bearing Restrictions: No      Mobility  Bed Mobility Overal bed mobility: Modified Independent                Transfers Overall transfer level: Needs assistance   Transfers: Sit to/from Stand Sit to Stand: Mod assist            Ambulation/Gait Ambulation/Gait assistance: Supervision Gait Distance (Feet): 15 Feet Assistive device: Rolling walker (2 wheeled) Gait Pattern/deviations: Decreased step length - right;Decreased step length - left Gait velocity: slow      Stairs            Wheelchair Mobility     Modified Rankin (Stroke Patients Only)       Balance                                             Pertinent Vitals/Pain Pain Assessment: No/denies pain    Home Living Family/patient expects to be discharged to:: Private residence Living Arrangements: Children Available Help at Discharge: Family;Available 24 hours/day Type of Home: House Home Access: Level entry     Home Layout: One level Home Equipment: Walker - 2 wheels;Shower seat;Bedside commode;Wheelchair - manual;Cane - single point      Prior Function Level of Independence: Needs assistance   Gait / Transfers Assistance Needed: daughter states pt mainly transfers into W/C at this time minimal ambulation  ADL's / Homemaking Assistance Needed: assisted by family        Hand Dominance   Dominant Hand: Right    Extremity/Trunk Assessment   Upper Extremity Assessment Upper Extremity Assessment: Generalized weakness    Lower Extremity Assessment Lower Extremity Assessment: Generalized weakness       Communication   Communication: HOH  Cognition Arousal/Alertness: Awake/alert   Overall Cognitive Status: Within Functional Limits for tasks assessed  Assessment/Plan    PT Assessment [place] does not need any further PT services  PT Problem List Decreased strength;Decreased activity tolerance;Decreased balance           PT Goals (Current goals can be found in the Care Plan section)  Acute Rehab PT Goals Patient Stated Goal: To Go home PT Goal Formulation: With patient Time For Goal Achievement: 12/23/19 Potential to Achieve Goals: Good    Frequency  N/A           AM-PAC PT "6 Clicks" Mobility  Outcome Measure                  End of Session Equipment Utilized During Treatment: Gait belt Activity Tolerance: Patient tolerated treatment well Patient left: with nursing/sitter in room Nurse  Communication: Mobility status PT Visit Diagnosis: Unsteadiness on feet (R26.81)    Time: 6962-9528 PT Time Calculation (min) (ACUTE ONLY): 0 min   Charges:   PT Evaluation $PT Eval Low Complexity: [place], PT CLT [phone] 12/23/2019, 12:53 PM

## 2019-12-25 ENCOUNTER — Telehealth: Payer: Self-pay | Admitting: Family Medicine

## 2019-12-25 NOTE — Telephone Encounter (Signed)
    TRANSITIONAL CARE MANAGEMENT TELEPHONE OUTREACH NOTE   Contact Date: 12/25/2019 Contacted By: [place] INFORMATION Date of Discharge:12/23/2019 Discharge Facility[facility] Discharge Diagnosis:Syncope   Outpatient Follow Up Recommendations (copied from discharge summary)  Recommendations for Outpatient Follow-up:  1. Follow up with PCP in 1 weeks Toni Gomez is a female primary care patient of [name], DO. An outgoing telephone call was made today and I spoke with [name].  Ms. [place]ott condition(s) and treatment(s) were discussed. An opportunity to ask questions was provided and all were answered or forwarded as appropriate.    ACTIVITIES OF DAILY LIVING  Toni Gomez lives alone and she can perform ADLs independently. her primary caregiver is self. she is able to depend on her primary caregiver(s) for consistent help. Transportation to appointments, to pick up medications, and to run errands is not a problem.  (Consider referral to [facility] if transportation or a consistent caregiver is a problem)   Fall Risk Fall Risk  11/09/2019 10/27/2019  Falls in the past year? 1 0  Number falls in past yr: 0 -  Injury with Fall? 0 -  Risk for fall due to : - -  Follow up Falls prevention discussed -    medium [place] Modifications/Assistive Devices Wheelchair: Yes Cane: No Ramp: No Bedside Toilet: No Hospital Bed:  No Other:    [place] she is not receiving home health  services.   MEDICATION RECONCILIATION  Ms. Lanese has been able to pick-up all prescribed discharge medications from the pharmacy.   A post discharge medication reconciliation was performed and the complete medication list was reviewed with the patient/caregiver and is current as of 12/25/2019. Changes highlighted below.  Discontinued Medications   Current Medication List Allergies as of 12/25/2019   No Known Allergies     Medication  List       Accurate as of December 25, 2019  8:35 AM. If you have any questions, ask your nurse or doctor.        acetaminophen 325 MG tablet Commonly known as: TYLENOL Take 2 tablets (650 mg total) by mouth every 6 (six) hours.   amLODipine 5 MG tablet Commonly known as: NORVASC Take 1 tablet (5 mg total) by mouth daily.   aspirin EC 81 MG tablet Take 1 tablet (81 mg total) by mouth daily with breakfast. Swallow whole.   atorvastatin 20 MG tablet Commonly known as: LIPITOR Take 1 tablet (20 mg total) by mouth daily.   azelastine 0.1 % nasal spray Commonly known as: ASTELIN Place 1 spray into both nostrils 2 (two) times daily.   feeding supplement (ENSURE ENLIVE) Liqd Take 237 mLs by mouth 2 (two) times daily between meals.   mirtazapine 7.5 MG tablet Commonly known as: REMERON Take 1 tablet (7.5 mg total) by mouth at bedtime.   vitamin B-12 1000 MCG tablet Commonly known as: CYANOCOBALAMIN Take 1 tablet (1,000 mcg total) by mouth daily.        PATIENT EDUCATION & FOLLOW-UP PLAN  An appointment for Transitional Care Management is scheduled with Jac Canavan, NP on 12/26/2019 at 12:00pm.  Take all medications as prescribed  Contact our office by calling [phone] if you have any questions or concerns.

## 2019-12-26 ENCOUNTER — Encounter: Payer: Self-pay | Admitting: Nurse Practitioner

## 2019-12-26 ENCOUNTER — Other Ambulatory Visit: Payer: Self-pay

## 2019-12-26 ENCOUNTER — Encounter: Payer: Self-pay | Admitting: *Deleted

## 2019-12-26 ENCOUNTER — Ambulatory Visit (INDEPENDENT_AMBULATORY_CARE_PROVIDER_SITE_OTHER): Payer: Medicare HMO | Admitting: Nurse Practitioner

## 2019-12-26 VITALS — BP 121/65 | HR 75 | Temp 97.7°F | Resp 18 | Ht 61.0 in | Wt 132.0 lb

## 2019-12-26 DIAGNOSIS — Z8673 Personal history of transient ischemic attack (TIA), and cerebral infarction without residual deficits: Secondary | ICD-10-CM

## 2019-12-26 DIAGNOSIS — R531 Weakness: Secondary | ICD-10-CM | POA: Diagnosis not present

## 2019-12-26 DIAGNOSIS — Z9181 History of falling: Secondary | ICD-10-CM

## 2019-12-26 DIAGNOSIS — R55 Syncope and collapse: Secondary | ICD-10-CM | POA: Diagnosis not present

## 2019-12-26 DIAGNOSIS — Z09 Encounter for follow-up examination after completed treatment for conditions other than malignant neoplasm: Secondary | ICD-10-CM | POA: Diagnosis not present

## 2019-12-26 NOTE — Progress Notes (Signed)
Established Patient Office Visit  Subjective:  Patient ID: Toni Gomez, female    DOB: [birth year]  Age: 86 y.o. MRN: [id number]  CC:  Chief Complaint  Patient presents with  . Transitions Of Care    TOC hosp f/u= [facility] 8/6-12/23/19- syncope     HPI DARL BRISBIN presents for Today's visit was for Transitional Care Management.  The patient was discharged from [facility] on 12/22/25 with a primary diagnosis of syncope  Contact with the patient and/or caregiver, by a clinical staff member, was made on 8/9/21and was documented as a telephone encounter within the EMR.  Through chart review and discussion with the patient I have determined that management of their condition is of moderate complexity.    Completed discharge instructions with patient and caregiver, medication reconciliation completed, patient and primary caregiver verbalized understanding.  No new concerns per caregiver today patient is stable and back to baseline.   Past Medical History:  Diagnosis Date  . Elevated troponin 03/26/2018  . Hyperlipidemia   . Hypertension   . Renal disorder    chronic kidney disease  . Rhabdomyolysis   . Stroke [facility])     Past Surgical History:  Procedure Laterality Date  . BREAST SURGERY  1980   R breast removed, not cancer    Family History  Problem Relation Age of Onset  . Hypertension Other   . Hypertension Mother   . Stroke Mother   . Asthma Father     Social History   Socioeconomic History  . Marital status: Widowed    Spouse name: Not on file  . Number of children: 0  . Years of education: Not on file  . Highest education level: 7th grade  Occupational History  . Occupation: retired  Tobacco Use  . Smoking status: Never Smoker  . Smokeless tobacco: Never Used  Vaping Use  . Vaping Use: Never used  Substance and Sexual Activity  . Alcohol use: No  . Drug use: No  . Sexual activity: Not Currently  Other Topics Concern  . Not on file  Social  History Narrative  . Not on file   Social Determinants of Health   Financial Resource Strain:   . Difficulty of Paying Living Expenses:   Food Insecurity:   . Worried About Charity fundraiser in the Last Year:   . Arboriculturist in the Last Year:   Transportation Needs:   . Film/video editor (Medical):   Marland Kitchen Lack of Transportation (Non-Medical):   Physical Activity:   . Days of Exercise per Week:   . Minutes of Exercise per Session:   Stress:   . Feeling of Stress :   Social Connections:   . Frequency of Communication with Friends and Family:   . Frequency of Social Gatherings with Friends and Family:   . Attends Religious Services:   . Active Member of Clubs or Organizations:   . Attends Archivist Meetings:   Marland Kitchen Marital Status:   Intimate Partner Violence:   . Fear of Current or Ex-Partner:   . Emotionally Abused:   Marland Kitchen Physically Abused:   . Sexually Abused:     Outpatient Medications Prior to Visit  Medication Sig Dispense Refill  . acetaminophen (TYLENOL) 325 MG tablet Take 2 tablets (650 mg total) by mouth every 6 (six) hours. 12 tablet 0  . amLODipine (NORVASC) 5 MG tablet Take 1 tablet (5 mg total) by mouth daily. 90 tablet 1  .  aspirin EC 81 MG tablet Take 1 tablet (81 mg total) by mouth daily with breakfast. Swallow whole. 30 tablet 11  . atorvastatin (LIPITOR) 20 MG tablet Take 1 tablet (20 mg total) by mouth daily. 90 tablet 3  . azelastine (ASTELIN) 0.1 % nasal spray Place 1 spray into both nostrils 2 (two) times daily. 30 mL 12  . feeding supplement, ENSURE ENLIVE, (ENSURE ENLIVE) LIQD Take 237 mLs by mouth 2 (two) times daily between meals. 14220 mL 1  . mirtazapine (REMERON) 7.5 MG tablet Take 1 tablet (7.5 mg total) by mouth at bedtime. 90 tablet 0  . vitamin B-12 (CYANOCOBALAMIN) 1000 MCG tablet Take 1 tablet (1,000 mcg total) by mouth daily. 30 tablet 3   No facility-administered medications prior to visit.    No Known  Allergies  ROS Review of Systems  Eyes: Negative.   Respiratory: Negative.   Cardiovascular: Negative.   Gastrointestinal: Negative.   Endocrine: Negative.   Genitourinary: Negative.   Skin: Negative.   Neurological: Negative.   Hematological: Negative.   Psychiatric/Behavioral: Negative.       Objective:    Physical Exam Vitals reviewed.  Constitutional:      Appearance: Normal appearance.  HENT:     Head: Normocephalic.     Nose: Nose normal.  Eyes:     Conjunctiva/sclera: Conjunctivae normal.  Cardiovascular:     Pulses: Normal pulses.     Heart sounds: Normal heart sounds.  Pulmonary:     Effort: Pulmonary effort is normal.     Breath sounds: Normal breath sounds.  Abdominal:     General: Bowel sounds are normal.  Musculoskeletal:        General: Normal range of motion.  Skin:    General: Skin is warm.  Neurological:     Mental Status: She is alert.     Comments: Follow up syncope  Psychiatric:        Mood and Affect: Mood normal.     BP 121/65   Pulse 75   Temp 97.7 F (36.5 C)   Resp 18   Ht 5\' 1"  (1.549 m)   Wt 132 lb (59.9 kg)   SpO2 97%   BMI 24.94 kg/m  Wt Readings from Last 3 Encounters:  12/26/19 132 lb (59.9 kg)  12/23/19 131 lb 6.3 oz (59.6 kg)  11/09/19 130 lb 9.6 oz (59.2 kg)     Health Maintenance Due  Topic Date Due  . COVID-19 Vaccine (1) Never done  . DEXA SCAN  Never done  . INFLUENZA VACCINE  12/17/2019      Lab Results  Component Value Date   TSH 1.267 11/01/2019   Lab Results  Component Value Date   WBC 4.6 12/23/2019   HGB 11.3 (L) 12/23/2019   HCT 36.8 12/23/2019   MCV 87.8 12/23/2019   PLT 269 12/23/2019   Lab Results  Component Value Date   NA 141 12/23/2019   K 3.8 12/23/2019   CO2 22 12/23/2019   GLUCOSE 74 12/23/2019   BUN 25 (H) 12/23/2019   CREATININE 1.16 (H) 12/23/2019   BILITOT 0.6 12/22/2019   ALKPHOS 81 12/22/2019   AST 38 12/22/2019   ALT 23 12/22/2019   PROT 6.8 12/22/2019    ALBUMIN 3.9 12/22/2019   CALCIUM 9.7 12/23/2019   ANIONGAP 10 12/23/2019   Lab Results  Component Value Date   CHOL 222 (H) 10/30/2018   Lab Results  Component Value Date   HDL 50 10/30/2018  Lab Results  Component Value Date   LDLCALC 156 (H) 10/30/2018   Lab Results  Component Value Date   TRIG 80 10/30/2018   Lab Results  Component Value Date   CHOLHDL 4.4 10/30/2018   Lab Results  Component Value Date   HGBA1C 5.4 10/30/2018      Assessment & Plan:   Problem List Items Addressed This Visit      Cardiovascular and Mediastinum   Syncope    Patient syncope symptoms well controlled.  No signs or symptoms of syncope today, provided education to patient with printed handout given to primary caregiver.  No changes to any medication.  Completed medication reconciliation after discharge from hospital. Patient knows to follow-up with any signs or symptoms of syncope.       Relevant Orders   DME Wheelchair manual     Other   History of stroke   Relevant Orders   DME Wheelchair manual   Hospital discharge follow-up - Primary    Patient is a 86 year old female who presents for today's visit transitional care management.  She was admitted to [facility] and discharged 12/22/2025 with a primary diagnosis of syncope.  We went over patient's discharge instructions.  Medication reconciliation completed.  Patient and primary caregiver verbalized understanding.  Patient is slightly weak but have no other concerns or signs and symptoms of syncope.  Patient is stable and back to baseline.  Confirmed home health orders completed in the hospital.  Ordered new lightweight wheelchair for patient. Patient knows to follow-up as needed       Other Visit Diagnoses    Weakness       Relevant Orders   DME Wheelchair manual   At risk for falls       Relevant Orders   DME Wheelchair manual      Follow-up: Return if symptoms worsen or fail to improve.    Ivy Lynn,  NP

## 2019-12-26 NOTE — Assessment & Plan Note (Signed)
Patient is a 86 year old female who presents for today's visit transitional care management.  She was admitted to [facility] and discharged 12/22/2025 with a primary diagnosis of syncope.  We went over patient's discharge instructions.  Medication reconciliation completed.  Patient and primary caregiver verbalized understanding.  Patient is slightly weak but have no other concerns or signs and symptoms of syncope.  Patient is stable and back to baseline.  Confirmed home health orders completed in the hospital.  Ordered new lightweight wheelchair for patient. Patient knows to follow-up as needed

## 2019-12-26 NOTE — Patient Instructions (Addendum)
Hospital discharge follow-up Patient is a 86 year old female who presents for today's visit transitional care management.  She was admitted to [facility] and discharged 12/22/2025 with a primary diagnosis of syncope.  We went over patient's discharge instructions.  Medication reconciliation completed.  Patient and primary caregiver verbalized understanding.  Patient is slightly weak but have no other concerns or signs and symptoms of syncope.  Patient is stable and back to baseline.  Confirmed home health orders completed in the hospital.  Ordered new lightweight wheelchair for patient. Patient knows to follow-up as needed  Syncope Patient syncope symptoms well controlled.  No signs or symptoms of syncope today, provided education to patient with printed handout given to primary caregiver.  No changes to any medication.  Completed medication reconciliation after discharge from hospital. Patient knows to follow-up with any signs or symptoms of syncope.    Syncope Syncope is when you pass out (faint) for a short time. It is caused by a sudden decrease in blood flow to the brain. Signs that you may be about to pass out include:  Feeling dizzy or light-headed.  Feeling sick to your stomach (nauseous).  Seeing all white or all black.  Having cold, clammy skin. If you pass out, get help right away. Call your local emergency services (911 in the [place] Do not drive yourself to the hospital. Follow these instructions at home: Watch for any changes in your symptoms. Take these actions to stay safe and help with your symptoms: Lifestyle  Do not drive, use machinery, or play sports until your doctor says it is okay.  Do not drink alcohol.  Do not use any products that contain nicotine or tobacco, such as cigarettes and e-cigarettes. If you need help quitting, ask your doctor.  Drink enough fluid to keep your pee (urine) pale yellow. General instructions  Take over-the-counter and  prescription medicines only as told by your doctor.  If you are taking blood pressure or heart medicine, sit up and stand up slowly. Spend a few minutes getting ready to sit and then stand. This can help you feel less dizzy.  Have someone stay with you until you feel stable.  If you start to feel like you might pass out, lie down right away and raise (elevate) your feet above the level of your heart. Breathe deeply and steadily. Wait until all of the symptoms are gone.  Keep all follow-up visits as told by your doctor. This is important. Get help right away if:  You have a very bad headache.  You pass out once or more than once.  You have pain in your chest, belly, or back.  You have a very fast or uneven heartbeat (palpitations).  It hurts to breathe.  You are bleeding from your mouth or your bottom (rectum).  You have black or tarry poop (stool).  You have jerky movements that you cannot control (seizure).  You are confused.  You have trouble walking.  You are very weak.  You have vision problems. These symptoms may be an emergency. Do not wait to see if the symptoms will go away. Get medical help right away. Call your local emergency services (911 in the [place] Do not drive yourself to the hospital. Summary  Syncope is when you pass out (faint) for a short time. It is caused by a sudden decrease in blood flow to the brain.  Signs that you may be about to faint include feeling dizzy, light-headed, or sick to your stomach,  seeing all white or all black, or having cold, clammy skin.  If you start to feel like you might pass out, lie down right away and raise (elevate) your feet above the level of your heart. Breathe deeply and steadily. Wait until all of the symptoms are gone. This information is not intended to replace advice given to you by your health care provider. Make sure you discuss any questions you have with your health care provider. Document Revised: 06/16/2017  Document Reviewed: 06/16/2017 Elsevier Patient Education  2020 [employer].

## 2019-12-26 NOTE — Assessment & Plan Note (Signed)
Patient syncope symptoms well controlled.  No signs or symptoms of syncope today, provided education to patient with printed handout given to primary caregiver.  No changes to any medication.  Completed medication reconciliation after discharge from hospital. Patient knows to follow-up with any signs or symptoms of syncope.

## 2019-12-27 DIAGNOSIS — Z7982 Long term (current) use of aspirin: Secondary | ICD-10-CM | POA: Diagnosis not present

## 2019-12-27 DIAGNOSIS — I4510 Unspecified right bundle-branch block: Secondary | ICD-10-CM | POA: Diagnosis not present

## 2019-12-27 DIAGNOSIS — Z79899 Other long term (current) drug therapy: Secondary | ICD-10-CM | POA: Diagnosis not present

## 2019-12-27 DIAGNOSIS — I1310 Hypertensive heart and chronic kidney disease without heart failure, with stage 1 through stage 4 chronic kidney disease, or unspecified chronic kidney disease: Secondary | ICD-10-CM | POA: Diagnosis not present

## 2019-12-27 DIAGNOSIS — M47816 Spondylosis without myelopathy or radiculopathy, lumbar region: Secondary | ICD-10-CM | POA: Diagnosis not present

## 2019-12-27 DIAGNOSIS — M47817 Spondylosis without myelopathy or radiculopathy, lumbosacral region: Secondary | ICD-10-CM | POA: Diagnosis not present

## 2019-12-27 DIAGNOSIS — N183 Chronic kidney disease, stage 3 unspecified: Secondary | ICD-10-CM | POA: Diagnosis not present

## 2019-12-27 DIAGNOSIS — E785 Hyperlipidemia, unspecified: Secondary | ICD-10-CM | POA: Diagnosis not present

## 2019-12-27 DIAGNOSIS — M6282 Rhabdomyolysis: Secondary | ICD-10-CM | POA: Diagnosis not present

## 2019-12-28 ENCOUNTER — Telehealth: Payer: Self-pay | Admitting: Family Medicine

## 2019-12-28 DIAGNOSIS — Z79899 Other long term (current) drug therapy: Secondary | ICD-10-CM | POA: Diagnosis not present

## 2019-12-28 DIAGNOSIS — M6282 Rhabdomyolysis: Secondary | ICD-10-CM | POA: Diagnosis not present

## 2019-12-28 DIAGNOSIS — E785 Hyperlipidemia, unspecified: Secondary | ICD-10-CM | POA: Diagnosis not present

## 2019-12-28 DIAGNOSIS — I1310 Hypertensive heart and chronic kidney disease without heart failure, with stage 1 through stage 4 chronic kidney disease, or unspecified chronic kidney disease: Secondary | ICD-10-CM | POA: Diagnosis not present

## 2019-12-28 DIAGNOSIS — N183 Chronic kidney disease, stage 3 unspecified: Secondary | ICD-10-CM | POA: Diagnosis not present

## 2019-12-28 DIAGNOSIS — Z7982 Long term (current) use of aspirin: Secondary | ICD-10-CM | POA: Diagnosis not present

## 2019-12-28 DIAGNOSIS — I4510 Unspecified right bundle-branch block: Secondary | ICD-10-CM | POA: Diagnosis not present

## 2019-12-28 DIAGNOSIS — M47817 Spondylosis without myelopathy or radiculopathy, lumbosacral region: Secondary | ICD-10-CM | POA: Diagnosis not present

## 2019-12-28 DIAGNOSIS — M47816 Spondylosis without myelopathy or radiculopathy, lumbar region: Secondary | ICD-10-CM | POA: Diagnosis not present

## 2019-12-28 NOTE — Telephone Encounter (Signed)
Office visit notes with patient's height and weight were faxed to number provided.

## 2019-12-28 NOTE — Telephone Encounter (Signed)
Toni Gomez states the order for the wheelchair was sent but needing additional info. Needing office notes and heigh and weight. Fax#: [phone]

## 2020-01-02 DIAGNOSIS — E785 Hyperlipidemia, unspecified: Secondary | ICD-10-CM | POA: Diagnosis not present

## 2020-01-02 DIAGNOSIS — R55 Syncope and collapse: Secondary | ICD-10-CM | POA: Diagnosis not present

## 2020-01-02 DIAGNOSIS — M6281 Muscle weakness (generalized): Secondary | ICD-10-CM | POA: Diagnosis not present

## 2020-01-02 DIAGNOSIS — M47817 Spondylosis without myelopathy or radiculopathy, lumbosacral region: Secondary | ICD-10-CM | POA: Diagnosis not present

## 2020-01-02 DIAGNOSIS — N183 Chronic kidney disease, stage 3 unspecified: Secondary | ICD-10-CM | POA: Diagnosis not present

## 2020-01-02 DIAGNOSIS — Z79899 Other long term (current) drug therapy: Secondary | ICD-10-CM | POA: Diagnosis not present

## 2020-01-02 DIAGNOSIS — Z8673 Personal history of transient ischemic attack (TIA), and cerebral infarction without residual deficits: Secondary | ICD-10-CM | POA: Diagnosis not present

## 2020-01-02 DIAGNOSIS — I1310 Hypertensive heart and chronic kidney disease without heart failure, with stage 1 through stage 4 chronic kidney disease, or unspecified chronic kidney disease: Secondary | ICD-10-CM | POA: Diagnosis not present

## 2020-01-02 DIAGNOSIS — M47816 Spondylosis without myelopathy or radiculopathy, lumbar region: Secondary | ICD-10-CM | POA: Diagnosis not present

## 2020-01-02 DIAGNOSIS — M6282 Rhabdomyolysis: Secondary | ICD-10-CM | POA: Diagnosis not present

## 2020-01-02 DIAGNOSIS — I4510 Unspecified right bundle-branch block: Secondary | ICD-10-CM | POA: Diagnosis not present

## 2020-01-02 DIAGNOSIS — Z7982 Long term (current) use of aspirin: Secondary | ICD-10-CM | POA: Diagnosis not present

## 2020-01-02 DIAGNOSIS — R262 Difficulty in walking, not elsewhere classified: Secondary | ICD-10-CM | POA: Diagnosis not present

## 2020-01-03 DIAGNOSIS — I1310 Hypertensive heart and chronic kidney disease without heart failure, with stage 1 through stage 4 chronic kidney disease, or unspecified chronic kidney disease: Secondary | ICD-10-CM | POA: Diagnosis not present

## 2020-01-03 DIAGNOSIS — N183 Chronic kidney disease, stage 3 unspecified: Secondary | ICD-10-CM | POA: Diagnosis not present

## 2020-01-03 DIAGNOSIS — M6282 Rhabdomyolysis: Secondary | ICD-10-CM | POA: Diagnosis not present

## 2020-01-03 DIAGNOSIS — E785 Hyperlipidemia, unspecified: Secondary | ICD-10-CM | POA: Diagnosis not present

## 2020-01-03 DIAGNOSIS — Z79899 Other long term (current) drug therapy: Secondary | ICD-10-CM | POA: Diagnosis not present

## 2020-01-03 DIAGNOSIS — I4510 Unspecified right bundle-branch block: Secondary | ICD-10-CM | POA: Diagnosis not present

## 2020-01-03 DIAGNOSIS — Z7982 Long term (current) use of aspirin: Secondary | ICD-10-CM | POA: Diagnosis not present

## 2020-01-03 DIAGNOSIS — M47816 Spondylosis without myelopathy or radiculopathy, lumbar region: Secondary | ICD-10-CM | POA: Diagnosis not present

## 2020-01-03 DIAGNOSIS — M47817 Spondylosis without myelopathy or radiculopathy, lumbosacral region: Secondary | ICD-10-CM | POA: Diagnosis not present

## 2020-01-04 ENCOUNTER — Other Ambulatory Visit: Payer: Self-pay | Admitting: *Deleted

## 2020-01-04 DIAGNOSIS — I10 Essential (primary) hypertension: Secondary | ICD-10-CM

## 2020-01-04 MED ORDER — AMLODIPINE BESYLATE 5 MG PO TABS: 5.0000 mg | ORAL_TABLET | Freq: Every day | ORAL | 1 refills | Status: DC

## 2020-01-04 NOTE — Telephone Encounter (Signed)
VM from [name] PT with [facility] states while she was with pt the pt said she had lost her rx for Amlodipine 5mg  and hasn't had any for the last 3 days. Wants to know if we can send rx to [place] for pt?

## 2020-01-04 NOTE — Telephone Encounter (Signed)
sent 

## 2020-01-04 NOTE — Telephone Encounter (Signed)
Yes ok to send.  May need to call pharmacy to ask for insurance override given lost Rx.

## 2020-01-05 DIAGNOSIS — I4510 Unspecified right bundle-branch block: Secondary | ICD-10-CM | POA: Diagnosis not present

## 2020-01-05 DIAGNOSIS — M6282 Rhabdomyolysis: Secondary | ICD-10-CM | POA: Diagnosis not present

## 2020-01-05 DIAGNOSIS — N179 Acute kidney failure, unspecified: Secondary | ICD-10-CM | POA: Diagnosis not present

## 2020-01-05 DIAGNOSIS — I1310 Hypertensive heart and chronic kidney disease without heart failure, with stage 1 through stage 4 chronic kidney disease, or unspecified chronic kidney disease: Secondary | ICD-10-CM | POA: Diagnosis not present

## 2020-01-05 DIAGNOSIS — R627 Adult failure to thrive: Secondary | ICD-10-CM | POA: Diagnosis not present

## 2020-01-05 DIAGNOSIS — N2581 Secondary hyperparathyroidism of renal origin: Secondary | ICD-10-CM | POA: Diagnosis not present

## 2020-01-05 DIAGNOSIS — N183 Chronic kidney disease, stage 3 unspecified: Secondary | ICD-10-CM | POA: Diagnosis not present

## 2020-01-05 DIAGNOSIS — I700 Atherosclerosis of aorta: Secondary | ICD-10-CM | POA: Diagnosis not present

## 2020-01-05 DIAGNOSIS — R55 Syncope and collapse: Secondary | ICD-10-CM | POA: Diagnosis not present

## 2020-01-09 DIAGNOSIS — R627 Adult failure to thrive: Secondary | ICD-10-CM | POA: Diagnosis not present

## 2020-01-09 DIAGNOSIS — I1310 Hypertensive heart and chronic kidney disease without heart failure, with stage 1 through stage 4 chronic kidney disease, or unspecified chronic kidney disease: Secondary | ICD-10-CM | POA: Diagnosis not present

## 2020-01-09 DIAGNOSIS — N2581 Secondary hyperparathyroidism of renal origin: Secondary | ICD-10-CM | POA: Diagnosis not present

## 2020-01-09 DIAGNOSIS — R55 Syncope and collapse: Secondary | ICD-10-CM | POA: Diagnosis not present

## 2020-01-09 DIAGNOSIS — I4510 Unspecified right bundle-branch block: Secondary | ICD-10-CM | POA: Diagnosis not present

## 2020-01-09 DIAGNOSIS — M6282 Rhabdomyolysis: Secondary | ICD-10-CM | POA: Diagnosis not present

## 2020-01-09 DIAGNOSIS — N179 Acute kidney failure, unspecified: Secondary | ICD-10-CM | POA: Diagnosis not present

## 2020-01-09 DIAGNOSIS — I700 Atherosclerosis of aorta: Secondary | ICD-10-CM | POA: Diagnosis not present

## 2020-01-09 DIAGNOSIS — N183 Chronic kidney disease, stage 3 unspecified: Secondary | ICD-10-CM | POA: Diagnosis not present

## 2020-01-11 DIAGNOSIS — N2581 Secondary hyperparathyroidism of renal origin: Secondary | ICD-10-CM | POA: Diagnosis not present

## 2020-01-11 DIAGNOSIS — I1310 Hypertensive heart and chronic kidney disease without heart failure, with stage 1 through stage 4 chronic kidney disease, or unspecified chronic kidney disease: Secondary | ICD-10-CM | POA: Diagnosis not present

## 2020-01-11 DIAGNOSIS — M6282 Rhabdomyolysis: Secondary | ICD-10-CM | POA: Diagnosis not present

## 2020-01-11 DIAGNOSIS — I700 Atherosclerosis of aorta: Secondary | ICD-10-CM | POA: Diagnosis not present

## 2020-01-11 DIAGNOSIS — N179 Acute kidney failure, unspecified: Secondary | ICD-10-CM | POA: Diagnosis not present

## 2020-01-11 DIAGNOSIS — N183 Chronic kidney disease, stage 3 unspecified: Secondary | ICD-10-CM | POA: Diagnosis not present

## 2020-01-11 DIAGNOSIS — R627 Adult failure to thrive: Secondary | ICD-10-CM | POA: Diagnosis not present

## 2020-01-11 DIAGNOSIS — R55 Syncope and collapse: Secondary | ICD-10-CM | POA: Diagnosis not present

## 2020-01-11 DIAGNOSIS — I4510 Unspecified right bundle-branch block: Secondary | ICD-10-CM | POA: Diagnosis not present

## 2020-01-15 DIAGNOSIS — N179 Acute kidney failure, unspecified: Secondary | ICD-10-CM | POA: Diagnosis not present

## 2020-01-15 DIAGNOSIS — M6282 Rhabdomyolysis: Secondary | ICD-10-CM | POA: Diagnosis not present

## 2020-01-15 DIAGNOSIS — I700 Atherosclerosis of aorta: Secondary | ICD-10-CM | POA: Diagnosis not present

## 2020-01-15 DIAGNOSIS — I4510 Unspecified right bundle-branch block: Secondary | ICD-10-CM | POA: Diagnosis not present

## 2020-01-15 DIAGNOSIS — N183 Chronic kidney disease, stage 3 unspecified: Secondary | ICD-10-CM | POA: Diagnosis not present

## 2020-01-15 DIAGNOSIS — N2581 Secondary hyperparathyroidism of renal origin: Secondary | ICD-10-CM | POA: Diagnosis not present

## 2020-01-15 DIAGNOSIS — R627 Adult failure to thrive: Secondary | ICD-10-CM | POA: Diagnosis not present

## 2020-01-15 DIAGNOSIS — I1310 Hypertensive heart and chronic kidney disease without heart failure, with stage 1 through stage 4 chronic kidney disease, or unspecified chronic kidney disease: Secondary | ICD-10-CM | POA: Diagnosis not present

## 2020-01-15 DIAGNOSIS — R55 Syncope and collapse: Secondary | ICD-10-CM | POA: Diagnosis not present

## 2020-01-18 DIAGNOSIS — I700 Atherosclerosis of aorta: Secondary | ICD-10-CM | POA: Diagnosis not present

## 2020-01-18 DIAGNOSIS — I1310 Hypertensive heart and chronic kidney disease without heart failure, with stage 1 through stage 4 chronic kidney disease, or unspecified chronic kidney disease: Secondary | ICD-10-CM | POA: Diagnosis not present

## 2020-01-18 DIAGNOSIS — M6282 Rhabdomyolysis: Secondary | ICD-10-CM | POA: Diagnosis not present

## 2020-01-18 DIAGNOSIS — I4510 Unspecified right bundle-branch block: Secondary | ICD-10-CM | POA: Diagnosis not present

## 2020-01-18 DIAGNOSIS — N183 Chronic kidney disease, stage 3 unspecified: Secondary | ICD-10-CM | POA: Diagnosis not present

## 2020-01-18 DIAGNOSIS — N2581 Secondary hyperparathyroidism of renal origin: Secondary | ICD-10-CM | POA: Diagnosis not present

## 2020-01-18 DIAGNOSIS — N179 Acute kidney failure, unspecified: Secondary | ICD-10-CM | POA: Diagnosis not present

## 2020-01-18 DIAGNOSIS — R55 Syncope and collapse: Secondary | ICD-10-CM | POA: Diagnosis not present

## 2020-01-18 DIAGNOSIS — R627 Adult failure to thrive: Secondary | ICD-10-CM | POA: Diagnosis not present

## 2020-01-22 DIAGNOSIS — M6282 Rhabdomyolysis: Secondary | ICD-10-CM | POA: Diagnosis not present

## 2020-01-22 DIAGNOSIS — I4510 Unspecified right bundle-branch block: Secondary | ICD-10-CM | POA: Diagnosis not present

## 2020-01-22 DIAGNOSIS — R627 Adult failure to thrive: Secondary | ICD-10-CM | POA: Diagnosis not present

## 2020-01-22 DIAGNOSIS — N179 Acute kidney failure, unspecified: Secondary | ICD-10-CM | POA: Diagnosis not present

## 2020-01-22 DIAGNOSIS — I1310 Hypertensive heart and chronic kidney disease without heart failure, with stage 1 through stage 4 chronic kidney disease, or unspecified chronic kidney disease: Secondary | ICD-10-CM | POA: Diagnosis not present

## 2020-01-22 DIAGNOSIS — I700 Atherosclerosis of aorta: Secondary | ICD-10-CM | POA: Diagnosis not present

## 2020-01-22 DIAGNOSIS — N2581 Secondary hyperparathyroidism of renal origin: Secondary | ICD-10-CM | POA: Diagnosis not present

## 2020-01-22 DIAGNOSIS — N183 Chronic kidney disease, stage 3 unspecified: Secondary | ICD-10-CM | POA: Diagnosis not present

## 2020-01-22 DIAGNOSIS — R55 Syncope and collapse: Secondary | ICD-10-CM | POA: Diagnosis not present

## 2020-01-23 DIAGNOSIS — L84 Corns and callosities: Secondary | ICD-10-CM | POA: Diagnosis not present

## 2020-01-23 DIAGNOSIS — B351 Tinea unguium: Secondary | ICD-10-CM | POA: Diagnosis not present

## 2020-01-23 DIAGNOSIS — M79676 Pain in unspecified toe(s): Secondary | ICD-10-CM | POA: Diagnosis not present

## 2020-01-23 DIAGNOSIS — I70203 Unspecified atherosclerosis of native arteries of extremities, bilateral legs: Secondary | ICD-10-CM | POA: Diagnosis not present

## 2020-01-25 ENCOUNTER — Telehealth: Payer: Self-pay | Admitting: *Deleted

## 2020-01-25 DIAGNOSIS — I4510 Unspecified right bundle-branch block: Secondary | ICD-10-CM | POA: Diagnosis not present

## 2020-01-25 DIAGNOSIS — I700 Atherosclerosis of aorta: Secondary | ICD-10-CM | POA: Diagnosis not present

## 2020-01-25 DIAGNOSIS — N2581 Secondary hyperparathyroidism of renal origin: Secondary | ICD-10-CM | POA: Diagnosis not present

## 2020-01-25 DIAGNOSIS — R55 Syncope and collapse: Secondary | ICD-10-CM | POA: Diagnosis not present

## 2020-01-25 DIAGNOSIS — N183 Chronic kidney disease, stage 3 unspecified: Secondary | ICD-10-CM | POA: Diagnosis not present

## 2020-01-25 DIAGNOSIS — N179 Acute kidney failure, unspecified: Secondary | ICD-10-CM | POA: Diagnosis not present

## 2020-01-25 DIAGNOSIS — R627 Adult failure to thrive: Secondary | ICD-10-CM | POA: Diagnosis not present

## 2020-01-25 DIAGNOSIS — M6282 Rhabdomyolysis: Secondary | ICD-10-CM | POA: Diagnosis not present

## 2020-01-25 DIAGNOSIS — I1310 Hypertensive heart and chronic kidney disease without heart failure, with stage 1 through stage 4 chronic kidney disease, or unspecified chronic kidney disease: Secondary | ICD-10-CM | POA: Diagnosis not present

## 2020-01-25 NOTE — Telephone Encounter (Signed)
VM from [place] PT w/ [facility] Pt has been out of her BP med since last week. It was 156/70 at visit today, she was weak & unable to participate in PT, she also does not have an appetite  LMOVM for [name] to call back to discuss patient's BP medication

## 2020-01-26 ENCOUNTER — Ambulatory Visit (INDEPENDENT_AMBULATORY_CARE_PROVIDER_SITE_OTHER): Payer: Medicare HMO | Admitting: Family Medicine

## 2020-01-26 ENCOUNTER — Other Ambulatory Visit: Payer: Self-pay

## 2020-01-26 ENCOUNTER — Encounter: Payer: Self-pay | Admitting: Family Medicine

## 2020-01-26 VITALS — BP 158/61 | HR 68 | Temp 98.3°F | Ht 61.0 in | Wt 131.8 lb

## 2020-01-26 DIAGNOSIS — R531 Weakness: Secondary | ICD-10-CM | POA: Diagnosis not present

## 2020-01-26 DIAGNOSIS — R630 Anorexia: Secondary | ICD-10-CM | POA: Diagnosis not present

## 2020-01-26 DIAGNOSIS — N3281 Overactive bladder: Secondary | ICD-10-CM

## 2020-01-26 DIAGNOSIS — I10 Essential (primary) hypertension: Secondary | ICD-10-CM | POA: Diagnosis not present

## 2020-01-26 MED ORDER — MIRABEGRON ER 25 MG PO TB24: 25.0000 mg | ORAL_TABLET | Freq: Every day | ORAL | 12 refills | Status: AC

## 2020-01-26 MED ORDER — MIRTAZAPINE 15 MG PO TABS: 15.0000 mg | ORAL_TABLET | Freq: Every day | ORAL | 3 refills | Status: DC

## 2020-01-26 MED ORDER — AMLODIPINE BESYLATE 10 MG PO TABS: 10.0000 mg | ORAL_TABLET | Freq: Every day | ORAL | 3 refills | Status: DC

## 2020-01-26 NOTE — Telephone Encounter (Signed)
Informed patient that there was a prescription at [place] for her Amlodipine

## 2020-01-26 NOTE — Progress Notes (Signed)
Subjective: CC: Follow-up weight PCP: Toni Norlander, DO PXT:GGYIRSW C Toni Gomez is a 86 y.o. female presenting to clinic today for:  1. Weight recheck She is accompanied by family today.  She reports patient has not had a good appetite recently.  She is unsure if she is actually taking her Mirtazapine (thinks she may have run out).  The 7.5mg  didn't really seem to be increasing appetite anyway and she asks for increased dose.  Patient typically only eating cereal/ oatmeal.  She has not been drinking well and this subsequently caused her to be hospitalized for dehydration.  She notes that she does not drink a lot because she does not want to be urinating a lot.  She has overactive bladder and wears depends.  She saturates these frequently.  She reports generalized weakness and difficulty with mobility.  She has received her new wheelchair and seems to be doing well with this so far.  She has not followed up with her endocrinologist but will go since I am recommending it.  Home health PT is coming in and working with patient.  Additionally, she reports having been out of her blood pressure medicine for several days now and has not yet picked it up.  She is currently on amlodipine 10 mg daily.  No chest pain, shortness of breath.  She has persistent lower extremity edema that is unchanged.   ROS: Per HPI  No Known Allergies Past Medical History:  Diagnosis Date  . Elevated troponin 03/26/2018  . Hyperlipidemia   . Hypertension   . Renal disorder    chronic kidney disease  . Rhabdomyolysis   . Stroke [facility])     Current Outpatient Medications:  .  acetaminophen (TYLENOL) 325 MG tablet, Take 2 tablets (650 mg total) by mouth every 6 (six) hours., Disp: 12 tablet, Rfl: 0 .  amLODipine (NORVASC) 5 MG tablet, Take 1 tablet (5 mg total) by mouth daily. (Patient taking differently: Take 10 mg by mouth daily. ), Disp: 90 tablet, Rfl: 1 .  aspirin EC 81 MG tablet, Take 1 tablet (81 mg total)  by mouth daily with breakfast. Swallow whole., Disp: 30 tablet, Rfl: 11 .  atorvastatin (LIPITOR) 20 MG tablet, Take 1 tablet (20 mg total) by mouth daily., Disp: 90 tablet, Rfl: 3 .  azelastine (ASTELIN) 0.1 % nasal spray, Place 1 spray into both nostrils 2 (two) times daily., Disp: 30 mL, Rfl: 12 .  mirtazapine (REMERON) 7.5 MG tablet, Take 1 tablet (7.5 mg total) by mouth at bedtime., Disp: 90 tablet, Rfl: 0 .  vitamin B-12 (CYANOCOBALAMIN) 1000 MCG tablet, Take 1 tablet (1,000 mcg total) by mouth daily., Disp: 30 tablet, Rfl: 3 Social History   Socioeconomic History  . Marital status: Widowed    Spouse name: Not on file  . Number of children: 0  . Years of education: Not on file  . Highest education level: 7th grade  Occupational History  . Occupation: retired  Tobacco Use  . Smoking status: Never Smoker  . Smokeless tobacco: Never Used  Vaping Use  . Vaping Use: Never used  Substance and Sexual Activity  . Alcohol use: No  . Drug use: No  . Sexual activity: Not Currently  Other Topics Concern  . Not on file  Social History Narrative  . Not on file   Social Determinants of Health   Financial Resource Strain:   . Difficulty of Paying Living Expenses: Not on file  Food Insecurity:   . Worried  About Running Out of Food in the Last Year: Not on file  . Ran Out of Food in the Last Year: Not on file  Transportation Needs:   . Lack of Transportation (Medical): Not on file  . Lack of Transportation (Non-Medical): Not on file  Physical Activity:   . Days of Exercise per Week: Not on file  . Minutes of Exercise per Session: Not on file  Stress:   . Feeling of Stress : Not on file  Social Connections:   . Frequency of Communication with Friends and Family: Not on file  . Frequency of Social Gatherings with Friends and Family: Not on file  . Attends Religious Services: Not on file  . Active Member of Clubs or Organizations: Not on file  . Attends Archivist  Meetings: Not on file  . Marital Status: Not on file  Intimate Partner Violence:   . Fear of Current or Ex-Partner: Not on file  . Emotionally Abused: Not on file  . Physically Abused: Not on file  . Sexually Abused: Not on file   Family History  Problem Relation Age of Onset  . Hypertension Other   . Hypertension Mother   . Stroke Mother   . Asthma Father     Objective: Office vital signs reviewed. BP (!) 158/61   Pulse 68   Temp 98.3 F (36.8 C) (Temporal)   Ht 5\' 1"  (1.549 m)   Wt 131 lb 12.8 oz (59.8 kg)   BMI 24.90 kg/m   Physical Examination:  General: Awake, alert, frail appearing elderly female, No acute distress HEENT: Normal, sclera white Cardio: regular rate and rhythm w/ occasional skipped beat, S1S2 heard, no murmurs appreciated Pulm: clear to auscultation bilaterally, no wheezes, rhonchi or rales; normal work of breathing on room air Extremities: warm, well perfused, 1+ pitting edema to mid shins bilaterally. MSK: arrives in wheelchair Neuro: oriented. Following commands  Assessment/ Plan: 86 y.o. female   1. Weakness Likely secondary to physical deconditioning, inadequate caloric intake and inadequate fluid intake.  I reinforced need for fluids.  Since her overactive bladder seems to be the barrier to her intake of oral fluids I started her on Myrbetriq.  2-week sample was provided and this was sent to her pharmacy.  We may need to consider increasing the dose.  I would like to see her back in the next couple of months for recheck.  Continue to work with physical therapy.  2. Overactive bladder Myrbetriq sample provided.  This is also been sent to her pharmacy.  3. Poor appetite Mirtazapine increased to 50 mg daily.  Reinforced need to discontinue 7.5 mg if they are in the house.  I would like to see her back again in the next couple of months to recheck weight and how she is progressing - mirtazapine (REMERON) 15 MG tablet; Take 1 tablet (15 mg total) by  mouth at bedtime. Cancel 7.5mg  rx  Dispense: 90 tablet; Refill: 3  4. Essential hypertension Not controlled but has been out of medication for the last several days. - amLODipine (NORVASC) 10 MG tablet; Take 1 tablet (10 mg total) by mouth daily. Cancel 5mg  tablets.  Dispense: 90 tablet; Refill: 3   No orders of the defined types were placed in this encounter.  No orders of the defined types were placed in this encounter.    Toni Norlander, DO [place] [phone]

## 2020-01-29 DIAGNOSIS — R55 Syncope and collapse: Secondary | ICD-10-CM | POA: Diagnosis not present

## 2020-01-29 DIAGNOSIS — I1310 Hypertensive heart and chronic kidney disease without heart failure, with stage 1 through stage 4 chronic kidney disease, or unspecified chronic kidney disease: Secondary | ICD-10-CM | POA: Diagnosis not present

## 2020-01-29 DIAGNOSIS — N179 Acute kidney failure, unspecified: Secondary | ICD-10-CM | POA: Diagnosis not present

## 2020-01-29 DIAGNOSIS — N2581 Secondary hyperparathyroidism of renal origin: Secondary | ICD-10-CM | POA: Diagnosis not present

## 2020-01-29 DIAGNOSIS — M6282 Rhabdomyolysis: Secondary | ICD-10-CM | POA: Diagnosis not present

## 2020-01-29 DIAGNOSIS — I700 Atherosclerosis of aorta: Secondary | ICD-10-CM | POA: Diagnosis not present

## 2020-01-29 DIAGNOSIS — R627 Adult failure to thrive: Secondary | ICD-10-CM | POA: Diagnosis not present

## 2020-01-29 DIAGNOSIS — N183 Chronic kidney disease, stage 3 unspecified: Secondary | ICD-10-CM | POA: Diagnosis not present

## 2020-01-29 DIAGNOSIS — I4510 Unspecified right bundle-branch block: Secondary | ICD-10-CM | POA: Diagnosis not present

## 2020-02-02 DIAGNOSIS — Z8673 Personal history of transient ischemic attack (TIA), and cerebral infarction without residual deficits: Secondary | ICD-10-CM | POA: Diagnosis not present

## 2020-02-02 DIAGNOSIS — R55 Syncope and collapse: Secondary | ICD-10-CM | POA: Diagnosis not present

## 2020-02-02 DIAGNOSIS — M6281 Muscle weakness (generalized): Secondary | ICD-10-CM | POA: Diagnosis not present

## 2020-02-02 DIAGNOSIS — R262 Difficulty in walking, not elsewhere classified: Secondary | ICD-10-CM | POA: Diagnosis not present

## 2020-02-04 DIAGNOSIS — I4510 Unspecified right bundle-branch block: Secondary | ICD-10-CM | POA: Diagnosis not present

## 2020-02-04 DIAGNOSIS — R55 Syncope and collapse: Secondary | ICD-10-CM | POA: Diagnosis not present

## 2020-02-04 DIAGNOSIS — N183 Chronic kidney disease, stage 3 unspecified: Secondary | ICD-10-CM | POA: Diagnosis not present

## 2020-02-04 DIAGNOSIS — N179 Acute kidney failure, unspecified: Secondary | ICD-10-CM | POA: Diagnosis not present

## 2020-02-04 DIAGNOSIS — I700 Atherosclerosis of aorta: Secondary | ICD-10-CM | POA: Diagnosis not present

## 2020-02-04 DIAGNOSIS — I1310 Hypertensive heart and chronic kidney disease without heart failure, with stage 1 through stage 4 chronic kidney disease, or unspecified chronic kidney disease: Secondary | ICD-10-CM | POA: Diagnosis not present

## 2020-02-04 DIAGNOSIS — R627 Adult failure to thrive: Secondary | ICD-10-CM | POA: Diagnosis not present

## 2020-02-04 DIAGNOSIS — N2581 Secondary hyperparathyroidism of renal origin: Secondary | ICD-10-CM | POA: Diagnosis not present

## 2020-02-04 DIAGNOSIS — M6282 Rhabdomyolysis: Secondary | ICD-10-CM | POA: Diagnosis not present

## 2020-02-05 ENCOUNTER — Ambulatory Visit (INDEPENDENT_AMBULATORY_CARE_PROVIDER_SITE_OTHER): Payer: Medicare HMO

## 2020-02-05 ENCOUNTER — Other Ambulatory Visit: Payer: Self-pay

## 2020-02-05 DIAGNOSIS — R55 Syncope and collapse: Secondary | ICD-10-CM

## 2020-02-05 DIAGNOSIS — N183 Chronic kidney disease, stage 3 unspecified: Secondary | ICD-10-CM

## 2020-02-05 DIAGNOSIS — E785 Hyperlipidemia, unspecified: Secondary | ICD-10-CM

## 2020-02-05 DIAGNOSIS — M6282 Rhabdomyolysis: Secondary | ICD-10-CM

## 2020-02-05 DIAGNOSIS — R627 Adult failure to thrive: Secondary | ICD-10-CM

## 2020-02-05 DIAGNOSIS — I4510 Unspecified right bundle-branch block: Secondary | ICD-10-CM

## 2020-02-05 DIAGNOSIS — Z8673 Personal history of transient ischemic attack (TIA), and cerebral infarction without residual deficits: Secondary | ICD-10-CM

## 2020-02-05 DIAGNOSIS — I700 Atherosclerosis of aorta: Secondary | ICD-10-CM | POA: Diagnosis not present

## 2020-02-05 DIAGNOSIS — M47816 Spondylosis without myelopathy or radiculopathy, lumbar region: Secondary | ICD-10-CM

## 2020-02-05 DIAGNOSIS — N179 Acute kidney failure, unspecified: Secondary | ICD-10-CM | POA: Diagnosis not present

## 2020-02-05 DIAGNOSIS — Z7982 Long term (current) use of aspirin: Secondary | ICD-10-CM

## 2020-02-05 DIAGNOSIS — Z79899 Other long term (current) drug therapy: Secondary | ICD-10-CM

## 2020-02-05 DIAGNOSIS — M47817 Spondylosis without myelopathy or radiculopathy, lumbosacral region: Secondary | ICD-10-CM

## 2020-02-05 DIAGNOSIS — N2581 Secondary hyperparathyroidism of renal origin: Secondary | ICD-10-CM

## 2020-02-05 DIAGNOSIS — I1310 Hypertensive heart and chronic kidney disease without heart failure, with stage 1 through stage 4 chronic kidney disease, or unspecified chronic kidney disease: Secondary | ICD-10-CM

## 2020-02-05 DIAGNOSIS — Z9181 History of falling: Secondary | ICD-10-CM

## 2020-02-05 DIAGNOSIS — Z9011 Acquired absence of right breast and nipple: Secondary | ICD-10-CM

## 2020-02-05 NOTE — Progress Notes (Signed)
Patient location: Home Provider location: [place]

## 2020-02-09 DIAGNOSIS — R627 Adult failure to thrive: Secondary | ICD-10-CM | POA: Diagnosis not present

## 2020-02-09 DIAGNOSIS — N179 Acute kidney failure, unspecified: Secondary | ICD-10-CM | POA: Diagnosis not present

## 2020-02-09 DIAGNOSIS — I700 Atherosclerosis of aorta: Secondary | ICD-10-CM | POA: Diagnosis not present

## 2020-02-09 DIAGNOSIS — N183 Chronic kidney disease, stage 3 unspecified: Secondary | ICD-10-CM | POA: Diagnosis not present

## 2020-02-09 DIAGNOSIS — R55 Syncope and collapse: Secondary | ICD-10-CM | POA: Diagnosis not present

## 2020-02-09 DIAGNOSIS — I4510 Unspecified right bundle-branch block: Secondary | ICD-10-CM | POA: Diagnosis not present

## 2020-02-09 DIAGNOSIS — I1310 Hypertensive heart and chronic kidney disease without heart failure, with stage 1 through stage 4 chronic kidney disease, or unspecified chronic kidney disease: Secondary | ICD-10-CM | POA: Diagnosis not present

## 2020-02-09 DIAGNOSIS — M6282 Rhabdomyolysis: Secondary | ICD-10-CM | POA: Diagnosis not present

## 2020-02-09 DIAGNOSIS — N2581 Secondary hyperparathyroidism of renal origin: Secondary | ICD-10-CM | POA: Diagnosis not present

## 2020-02-14 DIAGNOSIS — I1310 Hypertensive heart and chronic kidney disease without heart failure, with stage 1 through stage 4 chronic kidney disease, or unspecified chronic kidney disease: Secondary | ICD-10-CM | POA: Diagnosis not present

## 2020-02-14 DIAGNOSIS — N183 Chronic kidney disease, stage 3 unspecified: Secondary | ICD-10-CM | POA: Diagnosis not present

## 2020-02-14 DIAGNOSIS — R627 Adult failure to thrive: Secondary | ICD-10-CM | POA: Diagnosis not present

## 2020-02-14 DIAGNOSIS — I700 Atherosclerosis of aorta: Secondary | ICD-10-CM | POA: Diagnosis not present

## 2020-02-14 DIAGNOSIS — N179 Acute kidney failure, unspecified: Secondary | ICD-10-CM | POA: Diagnosis not present

## 2020-02-14 DIAGNOSIS — I4510 Unspecified right bundle-branch block: Secondary | ICD-10-CM | POA: Diagnosis not present

## 2020-02-14 DIAGNOSIS — R55 Syncope and collapse: Secondary | ICD-10-CM | POA: Diagnosis not present

## 2020-02-14 DIAGNOSIS — M6282 Rhabdomyolysis: Secondary | ICD-10-CM | POA: Diagnosis not present

## 2020-02-14 DIAGNOSIS — N2581 Secondary hyperparathyroidism of renal origin: Secondary | ICD-10-CM | POA: Diagnosis not present

## 2020-02-21 DIAGNOSIS — N183 Chronic kidney disease, stage 3 unspecified: Secondary | ICD-10-CM | POA: Diagnosis not present

## 2020-02-21 DIAGNOSIS — N2581 Secondary hyperparathyroidism of renal origin: Secondary | ICD-10-CM | POA: Diagnosis not present

## 2020-02-21 DIAGNOSIS — M6282 Rhabdomyolysis: Secondary | ICD-10-CM | POA: Diagnosis not present

## 2020-02-21 DIAGNOSIS — N179 Acute kidney failure, unspecified: Secondary | ICD-10-CM | POA: Diagnosis not present

## 2020-02-21 DIAGNOSIS — R627 Adult failure to thrive: Secondary | ICD-10-CM | POA: Diagnosis not present

## 2020-02-21 DIAGNOSIS — I1310 Hypertensive heart and chronic kidney disease without heart failure, with stage 1 through stage 4 chronic kidney disease, or unspecified chronic kidney disease: Secondary | ICD-10-CM | POA: Diagnosis not present

## 2020-02-21 DIAGNOSIS — I700 Atherosclerosis of aorta: Secondary | ICD-10-CM | POA: Diagnosis not present

## 2020-02-21 DIAGNOSIS — R55 Syncope and collapse: Secondary | ICD-10-CM | POA: Diagnosis not present

## 2020-02-21 DIAGNOSIS — I4510 Unspecified right bundle-branch block: Secondary | ICD-10-CM | POA: Diagnosis not present

## 2020-03-03 DIAGNOSIS — M6281 Muscle weakness (generalized): Secondary | ICD-10-CM | POA: Diagnosis not present

## 2020-03-03 DIAGNOSIS — R55 Syncope and collapse: Secondary | ICD-10-CM | POA: Diagnosis not present

## 2020-03-03 DIAGNOSIS — R262 Difficulty in walking, not elsewhere classified: Secondary | ICD-10-CM | POA: Diagnosis not present

## 2020-03-03 DIAGNOSIS — Z8673 Personal history of transient ischemic attack (TIA), and cerebral infarction without residual deficits: Secondary | ICD-10-CM | POA: Diagnosis not present

## 2020-04-02 DIAGNOSIS — L84 Corns and callosities: Secondary | ICD-10-CM | POA: Diagnosis not present

## 2020-04-02 DIAGNOSIS — B351 Tinea unguium: Secondary | ICD-10-CM | POA: Diagnosis not present

## 2020-04-02 DIAGNOSIS — I70203 Unspecified atherosclerosis of native arteries of extremities, bilateral legs: Secondary | ICD-10-CM | POA: Diagnosis not present

## 2020-04-02 DIAGNOSIS — M79676 Pain in unspecified toe(s): Secondary | ICD-10-CM | POA: Diagnosis not present

## 2020-04-03 DIAGNOSIS — M6281 Muscle weakness (generalized): Secondary | ICD-10-CM | POA: Diagnosis not present

## 2020-04-03 DIAGNOSIS — R262 Difficulty in walking, not elsewhere classified: Secondary | ICD-10-CM | POA: Diagnosis not present

## 2020-04-03 DIAGNOSIS — R55 Syncope and collapse: Secondary | ICD-10-CM | POA: Diagnosis not present

## 2020-04-03 DIAGNOSIS — Z8673 Personal history of transient ischemic attack (TIA), and cerebral infarction without residual deficits: Secondary | ICD-10-CM | POA: Diagnosis not present

## 2020-04-24 ENCOUNTER — Encounter: Payer: Self-pay | Admitting: Family Medicine

## 2020-04-25 ENCOUNTER — Other Ambulatory Visit: Payer: Self-pay

## 2020-04-25 ENCOUNTER — Encounter: Payer: Self-pay | Admitting: Family Medicine

## 2020-04-25 ENCOUNTER — Ambulatory Visit (INDEPENDENT_AMBULATORY_CARE_PROVIDER_SITE_OTHER): Payer: Medicare HMO | Admitting: Family Medicine

## 2020-04-25 VITALS — BP 148/66 | HR 61 | Temp 98.1°F | Ht 61.0 in | Wt 129.6 lb

## 2020-04-25 DIAGNOSIS — Z789 Other specified health status: Secondary | ICD-10-CM | POA: Insufficient documentation

## 2020-04-25 DIAGNOSIS — I10 Essential (primary) hypertension: Secondary | ICD-10-CM | POA: Diagnosis not present

## 2020-04-25 DIAGNOSIS — H903 Sensorineural hearing loss, bilateral: Secondary | ICD-10-CM

## 2020-04-25 DIAGNOSIS — R630 Anorexia: Secondary | ICD-10-CM | POA: Diagnosis not present

## 2020-04-25 DIAGNOSIS — R5381 Other malaise: Secondary | ICD-10-CM | POA: Diagnosis not present

## 2020-04-25 DIAGNOSIS — Z7409 Other reduced mobility: Secondary | ICD-10-CM | POA: Insufficient documentation

## 2020-04-25 MED ORDER — AMLODIPINE BESYLATE 10 MG PO TABS: 10.0000 mg | ORAL_TABLET | Freq: Every day | ORAL | 3 refills | Status: AC

## 2020-04-25 MED ORDER — MIRTAZAPINE 15 MG PO TABS: 15.0000 mg | ORAL_TABLET | Freq: Every day | ORAL | 3 refills | Status: AC

## 2020-04-25 MED ORDER — VITAMIN B-12 1000 MCG PO TABS: 1000.0000 ug | ORAL_TABLET | Freq: Every day | ORAL | 3 refills | Status: AC

## 2020-04-25 MED ORDER — ATORVASTATIN CALCIUM 20 MG PO TABS: 20.0000 mg | ORAL_TABLET | Freq: Every day | ORAL | 3 refills | Status: AC

## 2020-04-25 NOTE — Patient Instructions (Signed)
TYLENOL before bedtime for arthritis  Ensure if you are not going to eat a REAL meal.  Call me next week if NO call about hearing aid or from [facility]

## 2020-04-25 NOTE — Progress Notes (Signed)
Subjective: CC: follow up weight, deconditioning PCP: [name], DO Toni Gomez is a 86 y.o. female presenting to clinic today for:  1.  Deconditioning, appetite Patient notes that her appetite still is lower than her normal but she has been trying to force herself to eat meals.  She will often eat a breakfast and at supper but will not always eat lunch.  Sometimes she just has crackers and peanut butter.  She has ensures at home.  She does feel that she is gotten stronger after physical therapy.  She continues to use a walker for ambulation.  She does feel that she needs assistance at home, particularly with ADLs.  She lives totally alone but is accompanied today by her former neighbor, Toni No.  Sometimes Toni Gomez is able to prepare meals but she also works so Toni Gomez is sometimes alone and has to fend for herself.  2.  Hypertension Patient needs refills on her Norvasc 10 mg daily, Lipitor 20 mg daily.  No chest pain, shortness of breath.  No dizziness  3.  Sensorineural hearing loss Noted on 11/2019 exam with audiology.  Apparently she still has not gotten any earing aids and is asking for me to help her with this as she struggles with hearing.  She will take anything that she can get at this point.  ROS: Per HPI  Allergies  Allergen Reactions  . Cardizem [Diltiazem]     Other reaction(s): Unknown  . Celecoxib     Other reaction(s): Unknown  . Macrobid [Nitrofurantoin]     Other reaction(s): Unknown  . Paxil [Paroxetine]     Other reaction(s): Unknown  . Singulair [Montelukast]     Other reaction(s): Unknown   Past Medical History:  Diagnosis Date  . Elevated troponin 03/26/2018  . Hyperlipidemia   . Hypertension   . Renal disorder    chronic kidney disease  . Rhabdomyolysis   . Stroke [facility])     Current Outpatient Medications:  .  acetaminophen (TYLENOL) 325 MG tablet, Take 2 tablets (650 mg total) by mouth every 6 (six) hours., Disp: 12 tablet, Rfl: 0 .   amLODipine (NORVASC) 10 MG tablet, Take 10 mg by mouth daily., Disp: , Rfl:  .  aspirin EC 81 MG tablet, Take 1 tablet (81 mg total) by mouth daily with breakfast. Swallow whole., Disp: 30 tablet, Rfl: 11 .  atorvastatin (LIPITOR) 20 MG tablet, Take 1 tablet (20 mg total) by mouth daily., Disp: 90 tablet, Rfl: 3 .  azelastine (ASTELIN) 0.1 % nasal spray, Place 1 spray into both nostrils 2 (two) times daily., Disp: 30 mL, Rfl: 12 .  mirabegron ER (MYRBETRIQ) 25 MG TB24 tablet, Take 1 tablet (25 mg total) by mouth daily., Disp: 30 tablet, Rfl: 12 .  mirtazapine (REMERON) 15 MG tablet, Take 1 tablet (15 mg total) by mouth at bedtime. Cancel 7.5mg  rx, Disp: 90 tablet, Rfl: 3 .  vitamin B-12 (CYANOCOBALAMIN) 1000 MCG tablet, Take 1 tablet (1,000 mcg total) by mouth daily., Disp: 30 tablet, Rfl: 3 Social History   Socioeconomic History  . Marital status: Widowed    Spouse name: Not on file  . Number of children: 0  . Years of education: Not on file  . Highest education level: 7th grade  Occupational History  . Occupation: retired  Tobacco Use  . Smoking status: Never Smoker  . Smokeless tobacco: Never Used  Vaping Use  . Vaping Use: Never used  Substance and Sexual Activity  .  Alcohol use: No  . Drug use: No  . Sexual activity: Not Currently  Other Topics Concern  . Not on file  Social History Narrative  . Not on file   Social Determinants of Health   Financial Resource Strain: Not on file  Food Insecurity: Not on file  Transportation Needs: Not on file  Physical Activity: Not on file  Stress: Not on file  Social Connections: Not on file  Intimate Partner Violence: Not on file   Family History  Problem Relation Age of Onset  . Hypertension Other   . Hypertension Mother   . Stroke Mother   . Asthma Father     Objective: Office vital signs reviewed. BP (!) 148/66   Pulse 61   Temp 98.1 F (36.7 C)   Ht 5\' 1"  (1.549 m)   Wt 129 lb 9.6 oz (58.8 kg)   SpO2 100%   BMI  24.49 kg/m   Physical Examination:  General: Awake, alert, thin but appears healthier than last visit, No acute distress HEENT: Normal, sclera white Cardio: regular rate and rhythm, S1S2 heard, no murmurs appreciated Pulm: clear to auscultation bilaterally, no wheezes, rhonchi or rales; normal work of breathing on room air MSK: slow gait and hunched station; requires rolling walker Neuro: very hard of hearing, follows commands  Assessment/ Plan: 86 y.o. female   Poor appetite - Plan: mirtazapine (REMERON) 15 MG tablet  Physical deconditioning  Impaired mobility and activities of daily living  Sensorineural hearing loss (SNHL) of both ears  Essential hypertension - Plan: amLODipine (NORVASC) 10 MG tablet  Continue mirtazapine.  She appears to be stronger than previous but still needs some extra help at home.  I explained to Toni Gomez that Medicare does not typically pay for personal care services but if she was eligible for Medicaid we could get this.  I have already emailed her the phone numbers to a couple of private personal care service agencies.  I will also try and reach out to chronic care management to see if they might be of assistance with this as well.  With regards to her hearing loss, I am unsure as to why she has not gotten any hearing aids yet but I will reach out to her audiologist to see how we can coordinate this for her.   No orders of the defined types were placed in this encounter.  No orders of the defined types were placed in this encounter.    [name], DO [place] [phone]

## 2020-04-26 ENCOUNTER — Telehealth: Payer: Self-pay | Admitting: *Deleted

## 2020-04-26 ENCOUNTER — Other Ambulatory Visit: Payer: Self-pay | Admitting: *Deleted

## 2020-04-26 DIAGNOSIS — W19XXXA Unspecified fall, initial encounter: Secondary | ICD-10-CM

## 2020-04-26 DIAGNOSIS — R5381 Other malaise: Secondary | ICD-10-CM

## 2020-04-26 DIAGNOSIS — I639 Cerebral infarction, unspecified: Secondary | ICD-10-CM

## 2020-04-26 DIAGNOSIS — Z7409 Other reduced mobility: Secondary | ICD-10-CM

## 2020-04-26 NOTE — Chronic Care Management (AMB) (Signed)
  Chronic Care Management   Outreach Note  04/26/2020 Name: Toni Gomez MRN: [id number] DOB: 04-19-1924  [name] is a 86 y.o. year old female who is a primary care patient of [name], DO. I reached out to [name] by phone today in response to a referral sent by Ms. Larwance Rote Topham's PCP, Ronnie Doss M, DO.     An unsuccessful telephone outreach was attempted today. The patient was referred to the case management team for assistance with care management and care coordination.   Follow Up Plan: A HIPAA compliant phone message was left for the patient providing contact information and requesting a return call. The care management team will reach out to the patient again over the next 7 days. If patient returns call to provider office, please advise to call [place] at [phone].  [place] Management

## 2020-04-29 NOTE — Chronic Care Management (AMB) (Signed)
  Chronic Care Management   Note  04/29/2020 Name: Toni Gomez MRN: [id number] DOB: [birth year]  Toni Gomez is a 86 y.o. year old female who is a primary care patient of [name], DO. I reached out to [name] by phone today in response to a referral sent by Ms. Larwance Rote Spainhour's PCP, [name], DO.     Ms. Mesmer was given information about Chronic Care Management services today including:  1. CCM service includes personalized support from designated clinical staff supervised by her physician, including individualized plan of care and coordination with other care providers 2. 24/7 contact phone numbers for assistance for urgent and routine care needs. 3. Service will only be billed when office clinical staff spend 20 minutes or more in a month to coordinate care. 4. Only one practitioner may furnish and bill the service in a calendar month. 5. The patient may stop CCM services at any time (effective at the end of the month) by phone call to the office staff. 6. The patient will be responsible for cost sharing (co-pay) of up to 20% of the service fee (after annual deductible is met).  Patient agreed to services and verbal consent obtained.   Follow up plan: Telephone appointment with care management team member scheduled for: 05/23/2020  [place] Management

## 2020-05-03 DIAGNOSIS — M6281 Muscle weakness (generalized): Secondary | ICD-10-CM | POA: Diagnosis not present

## 2020-05-03 DIAGNOSIS — R55 Syncope and collapse: Secondary | ICD-10-CM | POA: Diagnosis not present

## 2020-05-03 DIAGNOSIS — R262 Difficulty in walking, not elsewhere classified: Secondary | ICD-10-CM | POA: Diagnosis not present

## 2020-05-03 DIAGNOSIS — Z8673 Personal history of transient ischemic attack (TIA), and cerebral infarction without residual deficits: Secondary | ICD-10-CM | POA: Diagnosis not present

## 2020-05-23 ENCOUNTER — Ambulatory Visit: Payer: Medicare HMO | Admitting: *Deleted

## 2020-05-23 DIAGNOSIS — I10 Essential (primary) hypertension: Secondary | ICD-10-CM

## 2020-05-23 DIAGNOSIS — H903 Sensorineural hearing loss, bilateral: Secondary | ICD-10-CM

## 2020-05-23 DIAGNOSIS — N1832 Chronic kidney disease, stage 3b: Secondary | ICD-10-CM

## 2020-05-29 ENCOUNTER — Emergency Department (HOSPITAL_COMMUNITY)
Admission: EM | Admit: 2020-05-29 | Discharge: 2020-05-29 | Disposition: A | Discharge status: Home / Self Care | Payer: Medicare HMO | Attending: Emergency Medicine | Admitting: Emergency Medicine

## 2020-05-29 ENCOUNTER — Other Ambulatory Visit: Payer: Self-pay

## 2020-05-29 ENCOUNTER — Encounter (HOSPITAL_COMMUNITY): Payer: Self-pay

## 2020-05-29 DIAGNOSIS — Z5321 Procedure and treatment not carried out due to patient leaving prior to being seen by health care provider: Secondary | ICD-10-CM | POA: Insufficient documentation

## 2020-05-29 DIAGNOSIS — U071 COVID-19: Secondary | ICD-10-CM | POA: Diagnosis not present

## 2020-05-29 DIAGNOSIS — R531 Weakness: Secondary | ICD-10-CM | POA: Diagnosis present

## 2020-05-29 LAB — CBC
HCT: 42.1 % (ref 36.0–46.0)
Hemoglobin: 13.1 g/dL (ref 12.0–15.0)
MCH: 26.8 pg (ref 26.0–34.0)
MCHC: 31.1 g/dL (ref 30.0–36.0)
MCV: 86.1 fL (ref 80.0–100.0)
Platelets: 240 10*3/uL (ref 150–400)
RBC: 4.89 MIL/uL (ref 3.87–5.11)
RDW: 15.3 % (ref 11.5–15.5)
WBC: 11.2 10*3/uL — ABNORMAL HIGH (ref 4.0–10.5)
nRBC: 0 % (ref 0.0–0.2)

## 2020-05-29 LAB — BASIC METABOLIC PANEL
Anion gap: 13 (ref 5–15)
BUN: 28 mg/dL — ABNORMAL HIGH (ref 8–23)
CO2: 24 mmol/L (ref 22–32)
Calcium: 10.5 mg/dL — ABNORMAL HIGH (ref 8.9–10.3)
Chloride: 103 mmol/L (ref 98–111)
Creatinine, Ser: 1.87 mg/dL — ABNORMAL HIGH (ref 0.44–1.00)
GFR, Estimated: 25 mL/min — ABNORMAL LOW (ref >60)
Glucose, Bld: 113 mg/dL — ABNORMAL HIGH (ref 70–99)
Potassium: 4.6 mmol/L (ref 3.5–5.1)
Sodium: 140 mmol/L (ref 135–145)

## 2020-05-29 NOTE — ED Triage Notes (Signed)
Pt presents to ED for generalized weakness, and states she is unable to eat. When asked why she can't eat, pt states "I don't know" Pt denies N/V/D

## 2020-05-30 ENCOUNTER — Encounter: Payer: Self-pay | Admitting: Family Medicine

## 2020-05-30 LAB — SARS CORONAVIRUS 2 (TAT 6-24 HRS): SARS Coronavirus 2: POSITIVE — AB

## 2020-06-03 DIAGNOSIS — Z8673 Personal history of transient ischemic attack (TIA), and cerebral infarction without residual deficits: Secondary | ICD-10-CM | POA: Diagnosis not present

## 2020-06-03 DIAGNOSIS — R262 Difficulty in walking, not elsewhere classified: Secondary | ICD-10-CM | POA: Diagnosis not present

## 2020-06-03 DIAGNOSIS — R55 Syncope and collapse: Secondary | ICD-10-CM | POA: Diagnosis not present

## 2020-06-03 DIAGNOSIS — M6281 Muscle weakness (generalized): Secondary | ICD-10-CM | POA: Diagnosis not present

## 2020-06-05 ENCOUNTER — Ambulatory Visit: Payer: Medicare HMO | Admitting: *Deleted

## 2020-06-05 DIAGNOSIS — H903 Sensorineural hearing loss, bilateral: Secondary | ICD-10-CM

## 2020-06-06 NOTE — Chronic Care Management (AMB) (Signed)
   06/06/2020  PAISLIE TESSLER 1924-02-25 [id number]  I spoke with Ms Mellor and her caregiver on 05/23/20 for an Initial CCM Visit and they had questions about her hearing evaluation that was done during the summer. She thought that she was going to receive hearing aids after that visit and did not.   I reviewed her chart and forwarded the following through My Chart Message so that she would have a written list of what the audiologist recommended.   Ms Schloesser was evaluated at [facility] [place] ([address] [phone]) during the summer but a hearing aid fitting and communication needs assessment with an audiologist was not done at that time. The following are the recommendations from their note:  Recommendations: 1. Caption Call Telephone  2. Communication Needs Assessment with an Audiologist if [place] is interested in hearing aids.  3. TV Ears can be purchased online, on [place], [place], [place] 4. The following are all amplified headsets that Francy can easily wear at home or with family to help her hear day to day conversations without the cost or upkeep of a hearing aids. The can be purchased online.  1. BeHear Access by [employer]ear and Hear 2. Bose Hearphones 3. [employer] Sound PockeTalker Ultra Duo [employer] with Headphone  Follow-up Plan: RNCM will reach out to patient over the next 10 days  Chong Sicilian, BSN, RN-BC [place] [facility] [place] Management Direct Dial: [phone]

## 2020-06-08 ENCOUNTER — Encounter (HOSPITAL_COMMUNITY): Payer: Self-pay

## 2020-06-08 ENCOUNTER — Emergency Department (HOSPITAL_COMMUNITY): Payer: Medicare HMO

## 2020-06-08 ENCOUNTER — Emergency Department (HOSPITAL_COMMUNITY)
Admission: EM | Admit: 2020-06-08 | Discharge: 2020-06-08 | Disposition: A | Discharge status: Home / Self Care | Payer: Medicare HMO | Attending: Emergency Medicine | Admitting: Emergency Medicine

## 2020-06-08 ENCOUNTER — Other Ambulatory Visit: Payer: Self-pay

## 2020-06-08 DIAGNOSIS — U071 COVID-19: Secondary | ICD-10-CM | POA: Insufficient documentation

## 2020-06-08 DIAGNOSIS — Z79899 Other long term (current) drug therapy: Secondary | ICD-10-CM | POA: Diagnosis not present

## 2020-06-08 DIAGNOSIS — Z7982 Long term (current) use of aspirin: Secondary | ICD-10-CM | POA: Insufficient documentation

## 2020-06-08 DIAGNOSIS — N183 Chronic kidney disease, stage 3 unspecified: Secondary | ICD-10-CM | POA: Diagnosis not present

## 2020-06-08 DIAGNOSIS — I129 Hypertensive chronic kidney disease with stage 1 through stage 4 chronic kidney disease, or unspecified chronic kidney disease: Secondary | ICD-10-CM | POA: Insufficient documentation

## 2020-06-08 DIAGNOSIS — R630 Anorexia: Secondary | ICD-10-CM | POA: Diagnosis not present

## 2020-06-08 DIAGNOSIS — R001 Bradycardia, unspecified: Secondary | ICD-10-CM | POA: Insufficient documentation

## 2020-06-08 DIAGNOSIS — R531 Weakness: Secondary | ICD-10-CM | POA: Diagnosis not present

## 2020-06-08 DIAGNOSIS — Z743 Need for continuous supervision: Secondary | ICD-10-CM | POA: Diagnosis not present

## 2020-06-08 DIAGNOSIS — R5381 Other malaise: Secondary | ICD-10-CM | POA: Diagnosis not present

## 2020-06-08 LAB — COMPREHENSIVE METABOLIC PANEL
ALT: 19 U/L (ref 0–44)
AST: 25 U/L (ref 15–41)
Albumin: 2.7 g/dL — ABNORMAL LOW (ref 3.5–5.0)
Alkaline Phosphatase: 59 U/L (ref 38–126)
Anion gap: 9 (ref 5–15)
BUN: 26 mg/dL — ABNORMAL HIGH (ref 8–23)
CO2: 19 mmol/L — ABNORMAL LOW (ref 22–32)
Calcium: 9.1 mg/dL (ref 8.9–10.3)
Chloride: 114 mmol/L — ABNORMAL HIGH (ref 98–111)
Creatinine, Ser: 1.28 mg/dL — ABNORMAL HIGH (ref 0.44–1.00)
GFR, Estimated: 39 mL/min — ABNORMAL LOW (ref >60)
Glucose, Bld: 80 mg/dL (ref 70–99)
Potassium: 4.1 mmol/L (ref 3.5–5.1)
Sodium: 142 mmol/L (ref 135–145)
Total Bilirubin: 0.6 mg/dL (ref 0.3–1.2)
Total Protein: 5.9 g/dL — ABNORMAL LOW (ref 6.5–8.1)

## 2020-06-08 LAB — URINALYSIS, ROUTINE W REFLEX MICROSCOPIC
Bacteria, UA: NONE SEEN
Bilirubin Urine: NEGATIVE
Glucose, UA: NEGATIVE mg/dL
Hgb urine dipstick: NEGATIVE
Ketones, ur: 5 mg/dL — AB
Leukocytes,Ua: NEGATIVE
Nitrite: NEGATIVE
Protein, ur: 100 mg/dL — AB
Specific Gravity, Urine: 1.013 (ref 1.005–1.030)
pH: 5 (ref 5.0–8.0)

## 2020-06-08 LAB — CBC WITH DIFFERENTIAL/PLATELET
Abs Immature Granulocytes: 0.07 10*3/uL (ref 0.00–0.07)
Basophils Absolute: 0 10*3/uL (ref 0.0–0.1)
Basophils Relative: 1 %
Eosinophils Absolute: 0.1 10*3/uL (ref 0.0–0.5)
Eosinophils Relative: 2 %
HCT: 44.6 % (ref 36.0–46.0)
Hemoglobin: 13.4 g/dL (ref 12.0–15.0)
Immature Granulocytes: 2 %
Lymphocytes Relative: 22 %
Lymphs Abs: 1 10*3/uL (ref 0.7–4.0)
MCH: 26 pg (ref 26.0–34.0)
MCHC: 30 g/dL (ref 30.0–36.0)
MCV: 86.6 fL (ref 80.0–100.0)
Monocytes Absolute: 0.3 10*3/uL (ref 0.1–1.0)
Monocytes Relative: 7 %
Neutro Abs: 3 10*3/uL (ref 1.7–7.7)
Neutrophils Relative %: 66 %
Platelets: 217 10*3/uL (ref 150–400)
RBC: 5.15 MIL/uL — ABNORMAL HIGH (ref 3.87–5.11)
RDW: 15.5 % (ref 11.5–15.5)
WBC: 4.5 10*3/uL (ref 4.0–10.5)
nRBC: 0 % (ref 0.0–0.2)

## 2020-06-08 MED ORDER — SODIUM CHLORIDE 0.9 % IV BOLUS
1000.0000 mL | Freq: Once | INTRAVENOUS | Status: AC
  Administered 2020-06-08: 1000 mL via INTRAVENOUS

## 2020-06-08 NOTE — ED Provider Notes (Signed)
Care of the patient assumed at the change of shift pending labs. Patient with recent Covid infection has had poor appetite and increasing weakness at home recently.  Physical Exam  BP (!) 144/59   Pulse 70   Temp 98 F (36.7 C) (Oral)   Resp 12   Ht 5\' 1"  (1.549 m)   Wt 52.2 kg   SpO2 98%   BMI 21.73 kg/m   Physical Exam Awake and alert RRR CTAB Abd benign ED Course/Procedures   Clinical Course as of 06/08/20 1755  Sat Jun 08, 2020  1534 CMP shows Creatinine improved from previous labs. CBC is normal.  [CS]  1651 UA is negative.  [CS]  7829 FAOZH with [name], patient's caregiver/neighbor. Patient's labs here are normal and patient is feeling better and would like to eat. [name] reports patient is mostly wheelchair bound but gets around the house well. They are comfortable with her coming home if she is able to eat here.  [CS]  0865 Patient eating applesauce and drinking some juice now. Plan discharge back home. Caregiver encouraged to have food options at home that appeal to the patient including supplement shakes such as Ensure. PCP follow up. RTED for any other concerns.  [CS]    Clinical Course User Index [CS] Truddie Hidden, MD    Procedures  MDM        Truddie Hidden, MD 06/08/20 1728

## 2020-06-08 NOTE — ED Provider Notes (Signed)
[facility] EMERGENCY DEPARTMENT Provider Note   CSN: [id number] Arrival date & time: 06/08/20  1152     History Chief Complaint  Patient presents with  . Weakness    Toni Gomez is a 87 y.o. female.  HPI 87 year old female presents with generalized weakness and poor appetite.  The history is primarily obtained from the patient.  For about 1 week she has been having poor appetite for food and drink.  Family is trying to get her to eat and drink.  Denies a cough but states she has having some sputum over this time.  Denies headache, chest pain, shortness of breath.  Feels diffusely weak but not focally weak.  No vomiting.  No urinary symptoms.  Chart review indicates she came to the ER on 1/12 and tested positive for Covid-19.  Did not stay to be evaluated.   Past Medical History:  Diagnosis Date  . Elevated troponin 03/26/2018  . Hyperlipidemia   . Hypertension   . Renal disorder    chronic kidney disease  . Rhabdomyolysis   . Stroke [facility])     Patient Active Problem List   Diagnosis Date Noted  . Sensorineural hearing loss (SNHL) of both ears 04/25/2020  . Impaired mobility and activities of daily living 04/25/2020  . Physical deconditioning 04/25/2020  . Poor appetite 04/25/2020  . Hospital discharge follow-up 12/26/2019  . Syncope 12/22/2019  . Hyperparathyroidism, primary ([place]) 07/19/2019  . Recurrent strokes ([place]) 10/29/2018  . Hypercalcemia 10/29/2018  . Allergic conjunctivitis of both eyes 07/25/2018  . Hyperlipidemia 03/31/2018  . History of stroke 03/28/2018  . CKD (chronic kidney disease) stage 3, GFR 30-59 ml/min ([facility])   . Fall   . Stroke (cerebrum) ([place])   . Abnormal EKG 03/27/2018  . Syncope and collapse 03/27/2018  . Acute encephalopathy 03/27/2018  . Altered mental status 03/26/2018  . Rhabdomyolysis 03/26/2018  . Hypertension 03/26/2018    Past Surgical History:  Procedure Laterality Date  . BREAST SURGERY  1980   R breast removed, not  cancer     OB History   No obstetric history on file.     Family History  Problem Relation Age of Onset  . Hypertension Other   . Hypertension Mother   . Stroke Mother   . Asthma Father     Social History   Tobacco Use  . Smoking status: Never Smoker  . Smokeless tobacco: Never Used  Vaping Use  . Vaping Use: Never used  Substance Use Topics  . Alcohol use: No  . Drug use: No    Home Medications Prior to Admission medications   Medication Sig Start Date End Date Taking? Authorizing Provider  acetaminophen (TYLENOL) 325 MG tablet Take 2 tablets (650 mg total) by mouth every 6 (six) hours. 12/23/19   Johnson, Clanford L, MD  amLODipine (NORVASC) 10 MG tablet Take 1 tablet (10 mg total) by mouth daily. 04/25/20   [name], DO  aspirin EC 81 MG tablet Take 1 tablet (81 mg total) by mouth daily with breakfast. Swallow whole. 11/03/19   Roxan Hockey, MD  atorvastatin (LIPITOR) 20 MG tablet Take 1 tablet (20 mg total) by mouth daily. 04/25/20   [name], DO  azelastine (ASTELIN) 0.1 % nasal spray Place 1 spray into both nostrils 2 (two) times daily. 09/20/19   [name], DO  mirabegron ER (MYRBETRIQ) 25 MG TB24 tablet Take 1 tablet (25 mg total) by mouth daily. 01/26/20   Ronnie Doss  M, DO  mirtazapine (REMERON) 15 MG tablet Take 1 tablet (15 mg total) by mouth at bedtime. Cancel 7.5mg  rx 04/25/20   Ronnie Doss M, DO  vitamin B-12 (CYANOCOBALAMIN) 1000 MCG tablet Take 1 tablet (1,000 mcg total) by mouth daily. 04/25/20   [name], DO    Allergies    Cardizem [diltiazem], Celecoxib, Macrobid [nitrofurantoin], Paxil [paroxetine], and Singulair [montelukast]  Review of Systems   Review of Systems  Constitutional: Negative for fever.  Respiratory: Positive for cough. Negative for shortness of breath.   Cardiovascular: Negative for chest pain.  Gastrointestinal: Negative for abdominal pain and vomiting.  Genitourinary: Negative  for dysuria.  Neurological: Positive for weakness. Negative for headaches.  All other systems reviewed and are negative.   Physical Exam Updated Vital Signs BP 112/62   Pulse 74   Temp 98 F (36.7 C) (Oral)   Resp 13   Ht 5\' 1"  (1.549 m)   Wt 52.2 kg   SpO2 100%   BMI 21.73 kg/m   Physical Exam Vitals and nursing note reviewed.  Constitutional:      General: She is not in acute distress.    Appearance: She is well-developed and well-nourished. She is not ill-appearing or diaphoretic.  HENT:     Head: Normocephalic and atraumatic.     Right Ear: External ear normal.     Left Ear: External ear normal.     Nose: Nose normal.  Eyes:     General:        Right eye: No discharge.        Left eye: No discharge.  Cardiovascular:     Rate and Rhythm: Regular rhythm. Bradycardia present.     Heart sounds: Normal heart sounds.  Pulmonary:     Effort: Pulmonary effort is normal.     Breath sounds: Normal breath sounds.  Abdominal:     Palpations: Abdomen is soft.     Tenderness: There is no abdominal tenderness.  Skin:    General: Skin is warm and dry.  Neurological:     Mental Status: She is alert and oriented to person, place, and time.     Comments: CN 3-12 grossly intact. 5/5 strength in both upper extremities. 4/5 strength in BLE Grossly normal sensation. Normal finger to nose.   Psychiatric:        Mood and Affect: Mood is not anxious.     ED Results / Procedures / Treatments   Labs (all labs ordered are listed, but only abnormal results are displayed) Labs Reviewed  CBC WITH DIFFERENTIAL/PLATELET - Abnormal; Notable for the following components:      Result Value   RBC 5.15 (*)    All other components within normal limits  URINALYSIS, ROUTINE W REFLEX MICROSCOPIC  COMPREHENSIVE METABOLIC PANEL    EKG EKG Interpretation  Date/Time:  Saturday June 08 2020 12:45:56 EST Ventricular Rate:  48 PR Interval:    QRS Duration: 170 QT Interval:  488 QTC  Calculation: 436 R Axis:   -41 Text Interpretation: Sinus bradycardia Prolonged PR interval Right bundle branch block Confirmed by Sherwood Gambler [phone]) on 06/08/2020 1:06:16 PM   Radiology DG Chest Portable 1 View  Result Date: 06/08/2020 CLINICAL DATA:  Patient with history of COVID.  Weakness. EXAM: PORTABLE CHEST 1 VIEW COMPARISON:  Chest radiograph 12/22/2019. FINDINGS: Monitoring leads overlie the patient. Stable cardiac and mediastinal contours. There are patchy airspace opacities throughout the right lung. No pleural effusion or pneumothorax. Thoracic spine degenerative changes.  IMPRESSION: Scattered patchy opacities within the right lung which may represent infection in the appropriate clinical setting. Electronically Signed   By: Lovey Newcomer M.D.   On: 06/08/2020 13:04    Procedures Procedures (including critical care time)  Medications Ordered in ED Medications  sodium chloride 0.9 % bolus 1,000 mL (1,000 mLs Intravenous New Bag/Given 06/08/20 1330)    ED Course  I have reviewed the triage vital signs and the nursing notes.  Pertinent labs & imaging results that were available during my care of the patient were reviewed by me and considered in my medical decision making (see chart for details).    MDM Rules/Calculators/A&P                          Patient's chart was reviewed and shows that she does have COVID-19.  However she seems to be at the end of her course.  My suspicion is she has decreased p.o. intake and probable dehydration from this.  Creatinine was mildly elevated when she was checked into the ER but left early a week ago.  CMP is currently pending. No focal weakness. Care to Dr. Karle Starch. Final Clinical Impression(s) / ED Diagnoses Final diagnoses:  None    Rx / DC Orders ED Discharge Orders    None       Sherwood Gambler, MD 06/08/20 [phone]

## 2020-06-08 NOTE — ED Triage Notes (Signed)
Pt brought to ED via [facility] for generalized weakness and not eating x 2 days. Pt covid + 10 days ago. Pt denies any issues and states she feels "very well, but I ain't been eating and they wanted me to come be seen but I wanted to stay home."

## 2020-06-14 ENCOUNTER — Ambulatory Visit: Payer: Medicare HMO | Admitting: *Deleted

## 2020-06-14 DIAGNOSIS — R531 Weakness: Secondary | ICD-10-CM

## 2020-06-14 DIAGNOSIS — R5381 Other malaise: Secondary | ICD-10-CM

## 2020-06-14 DIAGNOSIS — I10 Essential (primary) hypertension: Secondary | ICD-10-CM

## 2020-06-14 DIAGNOSIS — H903 Sensorineural hearing loss, bilateral: Secondary | ICD-10-CM

## 2020-06-14 DIAGNOSIS — N1832 Chronic kidney disease, stage 3b: Secondary | ICD-10-CM

## 2020-06-14 DIAGNOSIS — Z789 Other specified health status: Secondary | ICD-10-CM

## 2020-06-14 NOTE — Chronic Care Management (AMB) (Signed)
Chronic Care Management   CCM RN Visit Note  05/23/2020 Name: Toni Gomez MRN: [id number] DOB: 04-Apr-1924  Subjective: Toni Gomez is a 87 y.o. year old female who is a primary care patient of [name], DO. The care management team was consulted for assistance with disease management and care coordination needs.    Engaged with patient by telephone for initial visit in response to provider referral for case management and/or care coordination services.   Consent to Services:  The patient was given the following information about Chronic Care Management services today, agreed to services, and gave verbal consent: 1. CCM service includes personalized support from designated clinical staff supervised by the primary care provider, including individualized plan of care and coordination with other care providers 2. 24/7 contact phone numbers for assistance for urgent and routine care needs. 3. Service will only be billed when office clinical staff spend 20 minutes or more in a month to coordinate care. 4. Only one practitioner may furnish and bill the service in a calendar month. 5.The patient may stop CCM services at any time (effective at the end of the month) by phone call to the office staff. 6. The patient will be responsible for cost sharing (co-pay) of up to 20% of the service fee (after annual deductible is met). Patient agreed to services and consent obtained.  Patient agreed to services and verbal consent obtained.   Assessment: Review of patient past medical history, allergies, medications, health status, including review of consultants reports, laboratory and other test data, was performed as part of comprehensive evaluation and provision of chronic care management services.   SDOH (Social Determinants of Health) assessments and interventions performed:    CCM Care Plan  Allergies  Allergen Reactions  . Cardizem [Diltiazem]     Other reaction(s): Unknown  . Celecoxib      Other reaction(s): Unknown  . Macrobid [Nitrofurantoin]     Other reaction(s): Unknown  . Paxil [Paroxetine]     Other reaction(s): Unknown  . Singulair [Montelukast]     Other reaction(s): Unknown    Outpatient Encounter Medications as of 05/23/2020  Medication Sig  . acetaminophen (TYLENOL) 325 MG tablet Take 2 tablets (650 mg total) by mouth every 6 (six) hours.  Marland Kitchen amLODipine (NORVASC) 10 MG tablet Take 1 tablet (10 mg total) by mouth daily.  Marland Kitchen aspirin EC 81 MG tablet Take 1 tablet (81 mg total) by mouth daily with breakfast. Swallow whole.  Marland Kitchen atorvastatin (LIPITOR) 20 MG tablet Take 1 tablet (20 mg total) by mouth daily.  Marland Kitchen azelastine (ASTELIN) 0.1 % nasal spray Place 1 spray into both nostrils 2 (two) times daily.  . mirabegron ER (MYRBETRIQ) 25 MG TB24 tablet Take 1 tablet (25 mg total) by mouth daily.  . mirtazapine (REMERON) 15 MG tablet Take 1 tablet (15 mg total) by mouth at bedtime. Cancel 7.26m rx  . vitamin B-12 (CYANOCOBALAMIN) 1000 MCG tablet Take 1 tablet (1,000 mcg total) by mouth daily.   No facility-administered encounter medications on file as of 05/23/2020.    Patient Active Problem List   Diagnosis Date Noted  . Sensorineural hearing loss (SNHL) of both ears 04/25/2020  . Impaired mobility and activities of daily living 04/25/2020  . Physical deconditioning 04/25/2020  . Poor appetite 04/25/2020  . Hospital discharge follow-up 12/26/2019  . Syncope 12/22/2019  . Hyperparathyroidism, primary ([place] 07/19/2019  . Recurrent strokes ([place] 10/29/2018  . Hypercalcemia 10/29/2018  . Allergic conjunctivitis of both eyes 07/25/2018  .  Hyperlipidemia 03/31/2018  . History of stroke 03/28/2018  . CKD (chronic kidney disease) stage 3, GFR 30-59 ml/min (HCC)   . Fall   . Stroke (cerebrum) ([place])   . Abnormal EKG 03/27/2018  . Syncope and collapse 03/27/2018  . Acute encephalopathy 03/27/2018  . Altered mental status 03/26/2018  . Rhabdomyolysis 03/26/2018  .  Hypertension 03/26/2018    Conditions to be addressed/monitored: HTN, CKD, sensorineural hearing loss, hx of CVA, poor appetite, physical deconditioning, hx of falls  Care Plan : RNCM: Sensorineural Hearing Loss  Updates made by Ilean China, RN since 06/14/2020 12:00 AM    Problem: Resources for Hearing Deficits   Priority: Medium    Goal: Obtain Hearing Aids/Devices   Start Date: 05/23/2020  This Visit's Progress: Not on track  Priority: Medium  Note:   Current Barriers:  Marland Kitchen Knowledge Deficits related to hearing aids/device acquisition  . Chronic Disease Management support and education needs related to sensorineural hearing loss . Unable to perform IADLs independently  Nurse Case Manager Clinical Goal(s):  Marland Kitchen Over the next 30 days, patient will verbalize understanding of plan for obtaining hearing aids or other hearing devices  Interventions:  . 1:1 collaboration with [name], DO regarding development and update of comprehensive plan of care as evidenced by provider attestation and co-signature . Inter-disciplinary care team collaboration (see longitudinal plan of care) . Evaluation of current treatment plan related to sensorineural hearing loss and patient's adherence to plan as established by provider. . Talked with patient/caregiver by telephone . Discussed hearing evaluation that was performed in July 2021 and that patient did not receive any hearing aids at that time . Discussed how hearing deficit affects daily life and that she feels that quality of life would be improved with hearing aids/devices . Advised that I would check into why she wasn't given hearing aids and provide an update once I have an answer    Care Plan : RNCM: Hypertension (Adult)  Updates made by Ilean China, RN since 06/14/2020 12:00 AM    Problem: Hypertension (Hypertension)   Priority: Medium    Long-Range Goal: Hypertension Monitored   This Visit's Progress: On track  Priority:  Medium  Note:   Current Barriers:  . Chronic Disease Management support and education needs related to hypertension  in a patient with CKD and hx of CVA . Unable to perform IADLs independently  Nurse Case Manager Clinical Goal(s):  Marland Kitchen Over the next 30 days, patient will verbalize understanding of plan for hypertension . Over the next 30 days, patient will work with Consulting civil engineer to address needs related to hypertension  Interventions:  . 1:1 collaboration with [name], DO regarding development and update of comprehensive plan of care as evidenced by provider attestation and co-signature . Inter-disciplinary care team collaboration (see longitudinal plan of care) . Evaluation of current treatment plan related to hypertension and patient's adherence to plan as established by provider.  Patient Goals/Self-Care Activities Over the next 30 days, patient will: . check blood pressure daily . write blood pressure results in a log or diary  . Bring blood pressure logs to PCP appointment . Call PCP with any readings outside of recommended range       Follow Up Plan:  . Telephone follow up appointment with care management team member scheduled for:06/14/20 with RN Care Manager . The patient has been provided with contact information for the care management team and has been advised to call with any  health related questions or concerns.   Chong Sicilian, BSN, RN-BC Embedded Chronic Care Manager [facility] / [place] Management Direct Dial: [phone]

## 2020-06-14 NOTE — Chronic Care Management (AMB) (Signed)
Chronic Care Management   CCM RN Visit Note  06/14/2020 Name: Toni Gomez MRN: [id number] DOB: [birth year]  Subjective: Toni Gomez is a 87 y.o. year old female who is a primary care patient of [name], DO. The care management team was consulted for assistance with disease management and care coordination needs.    Engaged with patient by telephone for follow up visit in response to provider referral for case management and/or care coordination services.   Consent to Services:  The patient was given information about Chronic Care Management services, agreed to services, and gave verbal consent prior to initiation of services.  Please see initial visit note for detailed documentation.   Patient agreed to services and verbal consent obtained.   Assessment: Review of patient past medical history, allergies, medications, health status, including review of consultants reports, laboratory and other test data, was performed as part of comprehensive evaluation and provision of chronic care management services.   SDOH (Social Determinants of Health) assessments and interventions performed: No   CCM Care Plan  Allergies  Allergen Reactions  . Cardizem [Diltiazem]     Other reaction(s): Unknown  . Celecoxib     Other reaction(s): Unknown  . Macrobid [Nitrofurantoin]     Other reaction(s): Unknown  . Paxil [Paroxetine]     Other reaction(s): Unknown  . Singulair [Montelukast]     Other reaction(s): Unknown    Outpatient Encounter Medications as of 06/14/2020  Medication Sig  . acetaminophen (TYLENOL) 325 MG tablet Take 2 tablets (650 mg total) by mouth every 6 (six) hours.  Marland Kitchen amLODipine (NORVASC) 10 MG tablet Take 1 tablet (10 mg total) by mouth daily.  Marland Kitchen aspirin EC 81 MG tablet Take 1 tablet (81 mg total) by mouth daily with breakfast. Swallow whole.  Marland Kitchen atorvastatin (LIPITOR) 20 MG tablet Take 1 tablet (20 mg total) by mouth daily.  Marland Kitchen azelastine (ASTELIN) 0.1 % nasal  spray Place 1 spray into both nostrils 2 (two) times daily.  Marland Kitchen LORazepam (ATIVAN) 0.5 MG tablet 1 tablet as needed  . mirabegron ER (MYRBETRIQ) 25 MG TB24 tablet Take 1 tablet (25 mg total) by mouth daily.  . mirtazapine (REMERON) 15 MG tablet Take 1 tablet (15 mg total) by mouth at bedtime. Cancel 7.5mg  rx  . vitamin B-12 (CYANOCOBALAMIN) 1000 MCG tablet Take 1 tablet (1,000 mcg total) by mouth daily.   No facility-administered encounter medications on file as of 06/14/2020.    Patient Active Problem List   Diagnosis Date Noted  . Sensorineural hearing loss (SNHL) of both ears 04/25/2020  . Impaired mobility and activities of daily living 04/25/2020  . Physical deconditioning 04/25/2020  . Poor appetite 04/25/2020  . Hospital discharge follow-up 12/26/2019  . Syncope 12/22/2019  . Hyperparathyroidism, primary ([place]) 07/19/2019  . Recurrent strokes ([place]) 10/29/2018  . Hypercalcemia 10/29/2018  . Allergic conjunctivitis of both eyes 07/25/2018  . Hyperlipidemia 03/31/2018  . History of stroke 03/28/2018  . CKD (chronic kidney disease) stage 3, GFR 30-59 ml/min (HCC)   . Fall   . Stroke (cerebrum) ([place])   . Abnormal EKG 03/27/2018  . Syncope and collapse 03/27/2018  . Acute encephalopathy 03/27/2018  . Altered mental status 03/26/2018  . Rhabdomyolysis 03/26/2018  . Hypertension 03/26/2018    Conditions to be addressed/monitored:HTN, CKD, sensorineural hearing loss, hx of CVA, poor appetite, physical deconditioning, hx of falls  Care Plan : RNCM: Sensorineural Hearing Loss  Updates made by Ilean China, RN since 06/14/2020 12:00 AM  Problem: Resources for Hearing Deficits   Priority: Medium    Goal: Obtain Hearing Aids/Devices   Start Date: 05/23/2020  Recent Progress: Not on track  Priority: Medium  Note:   Current Barriers:  Marland Kitchen Knowledge Deficits related to hearing aids/device acquisition  . Chronic Disease Management support and education needs related to  sensorineural hearing loss . Unable to perform IADLs independently  Nurse Case Manager Clinical Goal(s):  Marland Kitchen Over the next 30 days, patient will verbalize understanding of plan for obtaining hearing aids or other hearing devices  Interventions:  . 1:1 collaboration with [name], DO regarding development and update of comprehensive plan of care as evidenced by provider attestation and co-signature . Inter-disciplinary care team collaboration (see longitudinal plan of care) . Evaluation of current treatment plan related to sensorineural hearing loss and patient's adherence to plan as established by provider. . Talked with patient/caregiver by telephone . Previously discussed hearing evaluation that was performed in July 2021 and that patient did not receive any hearing aids at that time . Chart reviewed including audiology office notes o Hearing aid fitting was not scheduled . Provided patient/caregiver with a list of their recommendations via My Chart Message o Message was received and read  Follow Up Plan:  Telephone follow up appointment with care management team member scheduled for: 06/27/20 with RN Care Manager The patient has been provided with contact information for the care management team and has been advised to call with any health related questions or concerns.        Care Plan : RNCM: Hypertension (Adult)  Updates made by Ilean China, RN since 06/14/2020 12:00 AM    Problem: Hypertension (Hypertension)   Priority: Medium    Long-Range Goal: Hypertension Monitored   This Visit's Progress: On track  Priority: Medium  Note:   Current Barriers:  . Chronic Disease Management support and education needs related to hypertension  in a patient with CKD and hx of CVA . Unable to perform IADLs independently  Nurse Case Manager Clinical Goal(s):  Marland Kitchen Over the next 30 days, patient will verbalize understanding of plan for hypertension . Over the next 30 days,  patient will work with Consulting civil engineer to address needs related to hypertension  Interventions:  . 1:1 collaboration with [name], DO regarding development and update of comprehensive plan of care as evidenced by provider attestation and co-signature . Inter-disciplinary care team collaboration (see longitudinal plan of care) . Evaluation of current treatment plan related to hypertension and patient's adherence to plan as established by provider.  Patient Goals/Self-Care Activities Over the next 30 days, patient will: . check blood pressure daily . write blood pressure results in a log or diary  . Bring blood pressure logs to PCP appointment . Call PCP with any readings outside of recommended range    Care Plan : RNCM: Weakness  Updates made by Ilean China, RN since 06/14/2020 12:00 AM    Problem: Recent ED Visits   Priority: High    Goal: Improve Strength and Decrease ED Visits   This Visit's Progress: Not on track  Priority: High  Note:   Current Barriers:  . Care Coordination needs related to weakness and recent visits in a patient with HTN, CKD, sensorineural hearing loss, hx of CVA, poor appetite, physical deconditioning, hx of falls . Transportation barriers . Advanced age . Unable to perform IADLs independently  Nurse Case Manager Clinical Goal(s):  Marland Kitchen Over the next 10 days, patient  will verbalize understanding of plan for improving strength and decreasing ED visits . Over the next 10 days, patient will work with PCP and [facility] clinical staff to address needs related to scheduling a follow-up appointment with PCP and ordering physical therapy for strength and gait training  Interventions:  . 1:1 collaboration with [name], DO regarding development and update of comprehensive plan of care as evidenced by provider attestation and co-signature . Inter-disciplinary care team collaboration (see longitudinal plan of care) . Chart reviewed including  relevant office notes, hospital notes, and lab results . Unsucessful outreach to patient by telephone . Talked with friend/caregiver, [name] (confirmed HIPAA), by telephone o Would like to schedule a follow-up appointment with Dr Lajuana Ripple o Inquired if home health PT would be helpful . Advised that physical therapy may be beneficial and that Dr Lajuana Ripple can order that at her visit . Explained that it has been very difficult to arrange home health physical therapy but outpatient PT is an option . Previously provided with RN Care Manager contact numer and encouraged to reach out as needed . Previously discussed need for in-home services and advised that Medicare does not cover those services o Discussed self-pay options o Recommended they contact [place] and inquire about applying for Medicaid. If approved she could get personal care services . Collaborated with [facility] front office staff to have them reach out to [name] to schedule patient with Dr Lajuana Ripple for an ED f/u and to order physical therapy . Encouraged to reach out to PCP with any new or worsening symptoms  Patient Goals/Self-Care Activities Over the next 10 days, patient will: . Patient will attend all scheduled provider appointments . Talk with PCP office regarding appointment scheduling . Work with PCP office to schedule physical therapy       Follow Up Plan:  Telephone follow up appointment with care management team member scheduled for: 06/27/20 with RN Care Manager The patient has been provided with contact information for the care management team and has been advised to call with any health related questions or concerns.  Patient/caregiver will be contacted to schedule a follow-up visit with PCP  Chong Sicilian, BSN, RN-BC [place] / [place] Management Direct Dial: [phone]

## 2020-06-14 NOTE — Patient Instructions (Signed)
Visit Information  Goals Addressed            This Visit's Progress   . Improve Strength       Timeframe:  Short-Term Goal Priority:  High Start Date:   06/14/20                          Expected End Date: 07/02/20                      . Patient will attend all scheduled provider appointments . Talk with PCP office regarding appointment scheduling . Work with PCP office to schedule physical therapy       Patient verbalizes understanding of instructions provided today and agrees to view in [place].    Follow Up Plan:  Telephone follow up appointment with care management team member scheduled for: 06/27/20 with RN Care Manager The patient has been provided with contact information for the care management team and has been advised to call with any health related questions or concerns.  Patient/caregiver will be contacted to schedule a follow-up visit with PCP   Chong Sicilian, BSN, RN-BC [place] / [place] Management Direct Dial: [phone]

## 2020-06-14 NOTE — Patient Instructions (Signed)
Visit Information  Goals Addressed            This Visit's Progress   . Track and Manage My Blood Pressure-Hypertension       Timeframe:  Long-Range Goal Priority:  Medium Start Date: 05/23/20                            Expected End Date:                       Follow Up Date 06/14/20   . check blood pressure daily . write blood pressure results in a log or diary  . Bring blood pressure logs to PCP appointment . Call PCP with any readings outside of recommended range   Why is this important?    You won't feel high blood pressure, but it can still hurt your blood vessels.   High blood pressure can cause heart or kidney problems. It can also cause a stroke.   Making lifestyle changes like losing a little weight or eating less salt will help.   Checking your blood pressure at home and at different times of the day can help to control blood pressure.   If the doctor prescribes medicine remember to take it the way the doctor ordered.   Call the office if you cannot afford the medicine or if there are questions about it.     Notes:        Patient Care Plan: RNCM: Sensorineural Hearing Loss    Problem Identified: Resources for Hearing Deficits   Priority: Medium    Goal: Obtain Hearing Aids/Devices   Start Date: 05/23/2020  This Visit's Progress: Not on track  Priority: Medium  Note:   Current Barriers:  Marland Kitchen Knowledge Deficits related to hearing aids/device acquisition  . Chronic Disease Management support and education needs related to sensorineural hearing loss . Unable to perform IADLs independently  Nurse Case Manager Clinical Goal(s):  Marland Kitchen Over the next 30 days, patient will verbalize understanding of plan for obtaining hearing aids or other hearing devices  Interventions:  . 1:1 collaboration with [name], DO regarding development and update of comprehensive plan of care as evidenced by provider attestation and co-signature . Inter-disciplinary care team  collaboration (see longitudinal plan of care) . Evaluation of current treatment plan related to sensorineural hearing loss and patient's adherence to plan as established by provider. . Talked with patient/caregiver by telephone . Discussed hearing evaluation that was performed in July 2021 and that patient did not receive any hearing aids at that time . Discussed how hearing deficit affects daily life and that she feels that quality of life would be improved with hearing aids/devices . Advised that I would check into why she wasn't given hearing aids and provide an update once I have an answer  Follow Up Plan:  Telephone follow up appointment with care management team member scheduled for: 06/14/20 with RN Care Manager The patient has been provided with contact information for the care management team and has been advised to call with any health related questions or concerns.        Patient Care Plan: RNCM: Hypertension (Adult)    Problem Identified: Hypertension (Hypertension)   Priority: Medium    Long-Range Goal: Hypertension Monitored   This Visit's Progress: On track  Priority: Medium  Note:   Current Barriers:  . Chronic Disease Management support and education needs related to hypertension  in a patient with CKD and hx of CVA . Unable to perform IADLs independently  Nurse Case Manager Clinical Goal(s):  Marland Kitchen Over the next 30 days, patient will verbalize understanding of plan for hypertension . Over the next 30 days, patient will work with Consulting civil engineer to address needs related to hypertension  Interventions:  . 1:1 collaboration with [name], DO regarding development and update of comprehensive plan of care as evidenced by provider attestation and co-signature . Inter-disciplinary care team collaboration (see longitudinal plan of care) . Evaluation of current treatment plan related to hypertension and patient's adherence to plan as established by  provider.  Patient Goals/Self-Care Activities Over the next 30 days, patient will: . check blood pressure daily . write blood pressure results in a log or diary  . Bring blood pressure logs to PCP appointment . Call PCP with any readings outside of recommended range  Follow Up Plan:  . Telephone follow up appointment with care management team member scheduled for:06/14/20 with RN Care Manager . The patient has been provided with contact information for the care management team and has been advised to call with any health related questions or concerns.           Patient verbalizes understanding of instructions provided today and agrees to view in [place].    Follow Up Plan:  . Telephone follow up appointment with care management team member scheduled for:06/14/20 with RN Care Manager . The patient has been provided with contact information for the care management team and has been advised to call with any health related questions or concerns.   Chong Sicilian, BSN, RN-BC Embedded Chronic Care Manager [facility] / [place] Management Direct Dial: [phone]

## 2020-06-17 ENCOUNTER — Telehealth: Payer: Self-pay

## 2020-06-17 NOTE — Telephone Encounter (Signed)
LM for her to call back and ask for triage to schedule Dr Darnell Level appt for pt = will need face to face OV

## 2020-06-17 NOTE — Telephone Encounter (Signed)
Pt called back and appt made

## 2020-06-25 ENCOUNTER — Ambulatory Visit: Payer: Medicare HMO | Admitting: Family Medicine

## 2020-06-27 ENCOUNTER — Ambulatory Visit: Payer: Medicare HMO | Admitting: *Deleted

## 2020-06-27 DIAGNOSIS — I10 Essential (primary) hypertension: Secondary | ICD-10-CM

## 2020-06-27 DIAGNOSIS — N1832 Chronic kidney disease, stage 3b: Secondary | ICD-10-CM

## 2020-06-27 DIAGNOSIS — R531 Weakness: Secondary | ICD-10-CM

## 2020-06-27 NOTE — Patient Instructions (Signed)
Visit Information  PATIENT GOALS: Goals Addressed            This Visit's Progress   . Improve Strength   Not on track    Timeframe:  Short-Term Goal Priority:  High Start Date:   06/14/20                          Expected End Date:                      Follow-up: 07/30/20  . Patient will attend all scheduled provider appointments . Keep appointment with Dr Lajuana Ripple on 07/26/20 . Work with PCP office to schedule physical therapy . Call PCP with any new or worsening symptoms . Continue to eat meals regularly  . Continue to increase activity level as safely tolerated       Patient verbalizes understanding of instructions provided today and agrees to view in [place].   Follow Up Plan:  Telephone follow up appointment with care management team member scheduled for: 07/30/20 with RN Care Manager The patient has been provided with contact information for the care management team and has been advised to call with any health related questions or concerns.  Next PCP appointment scheduled for 07/26/20 with Dr Lajuana Ripple

## 2020-06-27 NOTE — Chronic Care Management (AMB) (Signed)
Chronic Care Management   CCM RN Visit Note  06/27/2020 Name: Toni Gomez MRN: [id number] DOB: May 12, 1924  Subjective: Toni Gomez is a 87 y.o. year old female who is a primary care patient of [name], DO. The care management team was consulted for assistance with disease management and care coordination needs.    Engaged with patient's friend/caregiver (HIPAA authorized), [name], for follow up visit in response to provider referral for case management and/or care coordination services.   Consent to Services:  The patient was given information about Chronic Care Management services, agreed to services, and gave verbal consent prior to initiation of services.  Please see initial visit note for detailed documentation.   Patient agreed to services and verbal consent obtained.   Assessment: Review of patient past medical history, allergies, medications, health status, including review of consultants reports, laboratory and other test data, was performed as part of comprehensive evaluation and provision of chronic care management services.   SDOH (Social Determinants of Health) assessments and interventions performed:  yes  CCM Care Plan  Allergies  Allergen Reactions  . Cardizem [Diltiazem]     Other reaction(s): Unknown  . Celecoxib     Other reaction(s): Unknown  . Macrobid [Nitrofurantoin]     Other reaction(s): Unknown  . Paxil [Paroxetine]     Other reaction(s): Unknown  . Singulair [Montelukast]     Other reaction(s): Unknown    Outpatient Encounter Medications as of 06/27/2020  Medication Sig  . acetaminophen (TYLENOL) 325 MG tablet Take 2 tablets (650 mg total) by mouth every 6 (six) hours.  Marland Kitchen amLODipine (NORVASC) 10 MG tablet Take 1 tablet (10 mg total) by mouth daily.  Marland Kitchen aspirin EC 81 MG tablet Take 1 tablet (81 mg total) by mouth daily with breakfast. Swallow whole.  Marland Kitchen atorvastatin (LIPITOR) 20 MG tablet Take 1 tablet (20 mg total) by mouth daily.  Marland Kitchen  azelastine (ASTELIN) 0.1 % nasal spray Place 1 spray into both nostrils 2 (two) times daily.  Marland Kitchen LORazepam (ATIVAN) 0.5 MG tablet 1 tablet as needed  . mirabegron ER (MYRBETRIQ) 25 MG TB24 tablet Take 1 tablet (25 mg total) by mouth daily.  . mirtazapine (REMERON) 15 MG tablet Take 1 tablet (15 mg total) by mouth at bedtime. Cancel 7.5mg  rx  . vitamin B-12 (CYANOCOBALAMIN) 1000 MCG tablet Take 1 tablet (1,000 mcg total) by mouth daily.   No facility-administered encounter medications on file as of 06/27/2020.    Patient Active Problem List   Diagnosis Date Noted  . Sensorineural hearing loss (SNHL) of both ears 04/25/2020  . Impaired mobility and activities of daily living 04/25/2020  . Physical deconditioning 04/25/2020  . Poor appetite 04/25/2020  . Hospital discharge follow-up 12/26/2019  . Syncope 12/22/2019  . Hyperparathyroidism, primary ([place]) 07/19/2019  . Recurrent strokes ([place]) 10/29/2018  . Hypercalcemia 10/29/2018  . Allergic conjunctivitis of both eyes 07/25/2018  . Hyperlipidemia 03/31/2018  . History of stroke 03/28/2018  . CKD (chronic kidney disease) stage 3, GFR 30-59 ml/min ([facility])   . Fall   . Stroke (cerebrum) ([place])   . Abnormal EKG 03/27/2018  . Syncope and collapse 03/27/2018  . Acute encephalopathy 03/27/2018  . Altered mental status 03/26/2018  . Rhabdomyolysis 03/26/2018  . Hypertension 03/26/2018    Conditions to be addressed/monitored:HTN, CKD, CVA and hx of CVA  Care Plan : RNCM: Weakness  Updates made by Ilean China, RN since 06/27/2020 12:00 AM    Problem: Recent ED Visits  Priority: High    Goal: Improve Strength and Decrease ED Visits   This Visit's Progress: Not on track  Recent Progress: Not on track  Priority: High  Note:   Current Barriers:  . Care Coordination needs related to weakness and recent visits in a patient with HTN, CKD, sensorineural hearing loss, hx of CVA, poor appetite, physical deconditioning, hx of  falls . Transportation barriers . Advanced age . Unable to perform IADLs independently  Nurse Case Manager Clinical Goal(s):  Marland Kitchen Over the next 30 days, patient will work with PCP and [facility] clinical staff to address needs related to scheduling a follow-up appointment with PCP and ordering physical therapy for strength and gait training  Interventions:  . 1:1 collaboration with [name], DO regarding development and update of comprehensive plan of care as evidenced by provider attestation and co-signature . Inter-disciplinary care team collaboration (see longitudinal plan of care) . Chart reviewed including relevant office notes, hospital notes, and lab results . Talked with friend/caregiver, [name] (confirmed HIPAA), by telephone o Per [name], patient is doing much better. Her appetite has improved and she is eating and drinking more. She is also moving around more during the day.  . Reviewed upcoming appt with Dr Lajuana Ripple for 07/26/20 for routine f/u and order for PT for strengthening and gait training . Previously explained that it has been very difficult to arrange home health physical therapy but outpatient PT is an option . Previously provided with RN Care Manager contact numer and encouraged to reach out as needed . Encouraged to reach out to PCP with any new or worsening symptoms  Patient Goals/Self-Care Activities Over the next 10 days, patient will: . Patient will attend all scheduled provider appointments . Keep appointment with Dr Lajuana Ripple on 07/26/20 . Work with PCP office to schedule physical therapy . Call PCP with any new or worsening symptoms . Continue to eat meals regularly  . Continue to increase activity level as safely tolerated        Follow Up Plan:   Telephone follow up appointment with care management team member scheduled for: 07/30/20 with RN Care Manager  The patient has been provided with contact information for the care management team and  has been advised to call with any health related questions or concerns.   Next PCP appointment scheduled for 07/26/20 with Dr Lajuana Ripple  Chong Sicilian, BSN, RN-BC [place] / [place] Management Direct Dial: [phone]

## 2020-07-04 DIAGNOSIS — R262 Difficulty in walking, not elsewhere classified: Secondary | ICD-10-CM | POA: Diagnosis not present

## 2020-07-04 DIAGNOSIS — R55 Syncope and collapse: Secondary | ICD-10-CM | POA: Diagnosis not present

## 2020-07-04 DIAGNOSIS — M6281 Muscle weakness (generalized): Secondary | ICD-10-CM | POA: Diagnosis not present

## 2020-07-04 DIAGNOSIS — Z8673 Personal history of transient ischemic attack (TIA), and cerebral infarction without residual deficits: Secondary | ICD-10-CM | POA: Diagnosis not present

## 2020-07-09 DIAGNOSIS — I70203 Unspecified atherosclerosis of native arteries of extremities, bilateral legs: Secondary | ICD-10-CM | POA: Diagnosis not present

## 2020-07-09 DIAGNOSIS — M79676 Pain in unspecified toe(s): Secondary | ICD-10-CM | POA: Diagnosis not present

## 2020-07-09 DIAGNOSIS — L84 Corns and callosities: Secondary | ICD-10-CM | POA: Diagnosis not present

## 2020-07-09 DIAGNOSIS — B351 Tinea unguium: Secondary | ICD-10-CM | POA: Diagnosis not present

## 2020-07-10 DIAGNOSIS — I469 Cardiac arrest, cause unspecified: Secondary | ICD-10-CM | POA: Diagnosis not present

## 2020-07-10 DIAGNOSIS — J9811 Atelectasis: Secondary | ICD-10-CM | POA: Diagnosis not present

## 2020-07-10 DIAGNOSIS — B348 Other viral infections of unspecified site: Secondary | ICD-10-CM | POA: Diagnosis not present

## 2020-07-10 DIAGNOSIS — I700 Atherosclerosis of aorta: Secondary | ICD-10-CM | POA: Diagnosis not present

## 2020-07-10 DIAGNOSIS — I213 ST elevation (STEMI) myocardial infarction of unspecified site: Secondary | ICD-10-CM | POA: Diagnosis not present

## 2020-07-10 DIAGNOSIS — Z515 Encounter for palliative care: Secondary | ICD-10-CM | POA: Diagnosis not present

## 2020-07-10 DIAGNOSIS — Z66 Do not resuscitate: Secondary | ICD-10-CM | POA: Diagnosis not present

## 2020-07-10 DIAGNOSIS — Z8673 Personal history of transient ischemic attack (TIA), and cerebral infarction without residual deficits: Secondary | ICD-10-CM | POA: Diagnosis not present

## 2020-07-10 DIAGNOSIS — R404 Transient alteration of awareness: Secondary | ICD-10-CM | POA: Diagnosis not present

## 2020-07-10 DIAGNOSIS — B9789 Other viral agents as the cause of diseases classified elsewhere: Secondary | ICD-10-CM | POA: Diagnosis not present

## 2020-07-10 DIAGNOSIS — I499 Cardiac arrhythmia, unspecified: Secondary | ICD-10-CM | POA: Diagnosis not present

## 2020-07-10 DIAGNOSIS — R778 Other specified abnormalities of plasma proteins: Secondary | ICD-10-CM | POA: Diagnosis not present

## 2020-07-10 DIAGNOSIS — I440 Atrioventricular block, first degree: Secondary | ICD-10-CM | POA: Diagnosis not present

## 2020-07-10 DIAGNOSIS — I10 Essential (primary) hypertension: Secondary | ICD-10-CM | POA: Diagnosis not present

## 2020-07-10 DIAGNOSIS — Z8674 Personal history of sudden cardiac arrest: Secondary | ICD-10-CM | POA: Diagnosis not present

## 2020-07-10 DIAGNOSIS — Z4682 Encounter for fitting and adjustment of non-vascular catheter: Secondary | ICD-10-CM | POA: Diagnosis not present

## 2020-07-10 DIAGNOSIS — Z95810 Presence of automatic (implantable) cardiac defibrillator: Secondary | ICD-10-CM | POA: Diagnosis not present

## 2020-07-10 DIAGNOSIS — R079 Chest pain, unspecified: Secondary | ICD-10-CM | POA: Diagnosis not present

## 2020-07-10 DIAGNOSIS — I214 Non-ST elevation (NSTEMI) myocardial infarction: Secondary | ICD-10-CM | POA: Diagnosis not present

## 2020-07-10 DIAGNOSIS — Z20822 Contact with and (suspected) exposure to covid-19: Secondary | ICD-10-CM | POA: Diagnosis not present

## 2020-07-10 DIAGNOSIS — R0789 Other chest pain: Secondary | ICD-10-CM | POA: Diagnosis not present

## 2020-07-10 DIAGNOSIS — E7800 Pure hypercholesterolemia, unspecified: Secondary | ICD-10-CM | POA: Diagnosis not present

## 2020-07-10 DIAGNOSIS — Z9911 Dependence on respirator [ventilator] status: Secondary | ICD-10-CM | POA: Diagnosis not present

## 2020-07-11 DIAGNOSIS — Z66 Do not resuscitate: Secondary | ICD-10-CM | POA: Diagnosis not present

## 2020-07-11 DIAGNOSIS — Z9911 Dependence on respirator [ventilator] status: Secondary | ICD-10-CM | POA: Diagnosis not present

## 2020-07-11 DIAGNOSIS — Z8674 Personal history of sudden cardiac arrest: Secondary | ICD-10-CM | POA: Diagnosis not present

## 2020-07-11 DIAGNOSIS — Z20822 Contact with and (suspected) exposure to covid-19: Secondary | ICD-10-CM | POA: Diagnosis not present

## 2020-07-11 DIAGNOSIS — J9811 Atelectasis: Secondary | ICD-10-CM | POA: Diagnosis not present

## 2020-07-11 DIAGNOSIS — I700 Atherosclerosis of aorta: Secondary | ICD-10-CM | POA: Diagnosis not present

## 2020-07-11 DIAGNOSIS — B348 Other viral infections of unspecified site: Secondary | ICD-10-CM | POA: Diagnosis not present

## 2020-07-11 DIAGNOSIS — Z515 Encounter for palliative care: Secondary | ICD-10-CM | POA: Diagnosis not present

## 2020-07-11 DIAGNOSIS — R778 Other specified abnormalities of plasma proteins: Secondary | ICD-10-CM | POA: Diagnosis not present

## 2020-07-11 DIAGNOSIS — Z8673 Personal history of transient ischemic attack (TIA), and cerebral infarction without residual deficits: Secondary | ICD-10-CM | POA: Diagnosis not present

## 2020-07-11 DIAGNOSIS — I213 ST elevation (STEMI) myocardial infarction of unspecified site: Secondary | ICD-10-CM | POA: Diagnosis not present

## 2020-07-11 DIAGNOSIS — E7800 Pure hypercholesterolemia, unspecified: Secondary | ICD-10-CM | POA: Diagnosis not present

## 2020-07-11 DIAGNOSIS — B9789 Other viral agents as the cause of diseases classified elsewhere: Secondary | ICD-10-CM | POA: Diagnosis not present

## 2020-07-11 DIAGNOSIS — I10 Essential (primary) hypertension: Secondary | ICD-10-CM | POA: Diagnosis not present

## 2020-07-16 DEATH — deceased

## 2020-07-26 ENCOUNTER — Ambulatory Visit: Payer: Medicare HMO | Admitting: Family Medicine

## 2020-07-30 ENCOUNTER — Telehealth: Payer: Medicare HMO

## 2020-08-07 ENCOUNTER — Ambulatory Visit: Payer: Medicare HMO | Admitting: Family Medicine

## 2020-08-31 IMAGING — CT CT HEAD W/O CM
3 series · 15 of 47 positions shown, 18 images · non-contrast
Comparison: 02/18/2019

CLINICAL DATA: Syncope.  Normal neurological examination.

EXAM:
CT HEAD WITHOUT CONTRAST
TECHNIQUE: Contiguous axial images were obtained from the base of the skull
through the vertex without intravenous contrast.

[Series 2: head w o · axial · 0.44mm/px · z∈[+64,+199]mm · 9 of 33 slices shown, 12 images]
[im 3/33  brain]
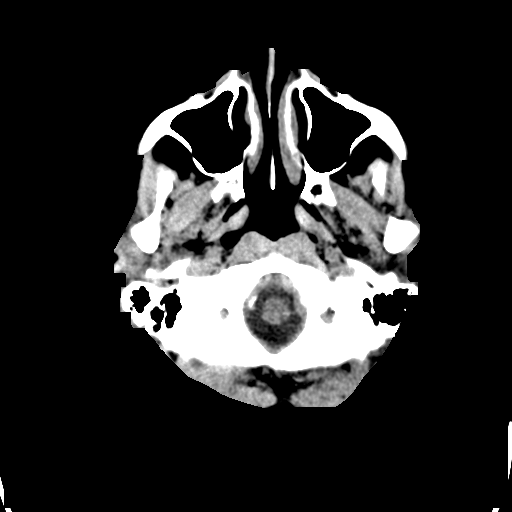
[im 3/33  bone]
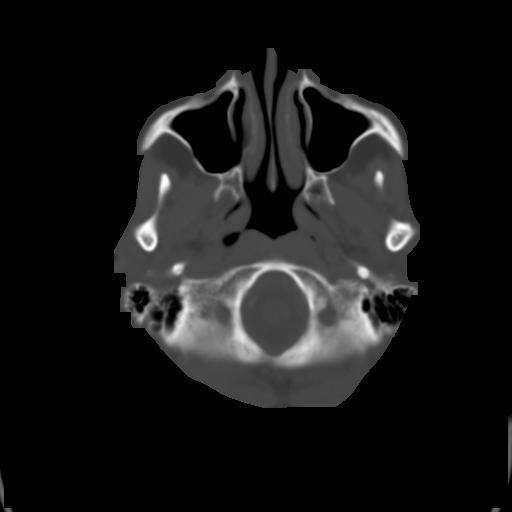
[im 6/33  brain]
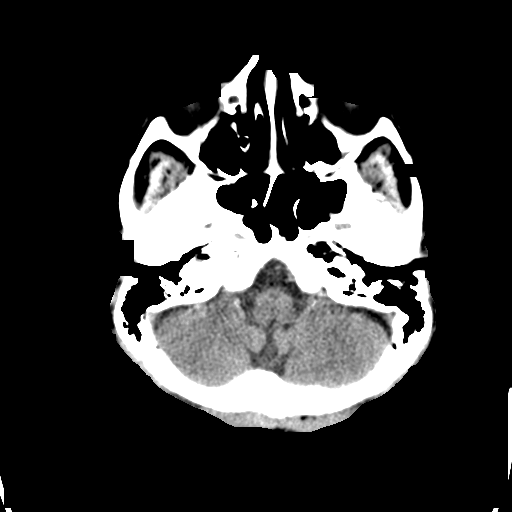
[im 9/33  brain]
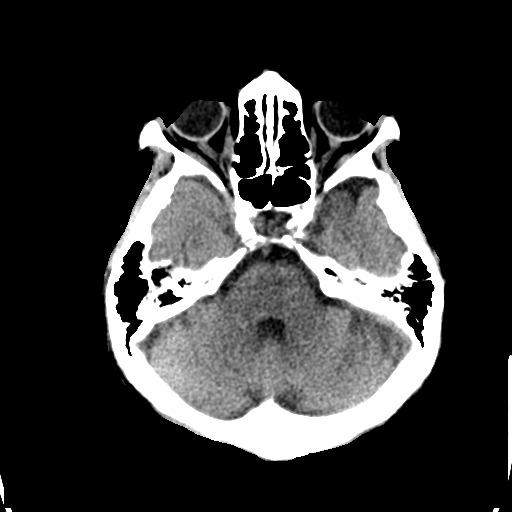
[im 13/33  brain]
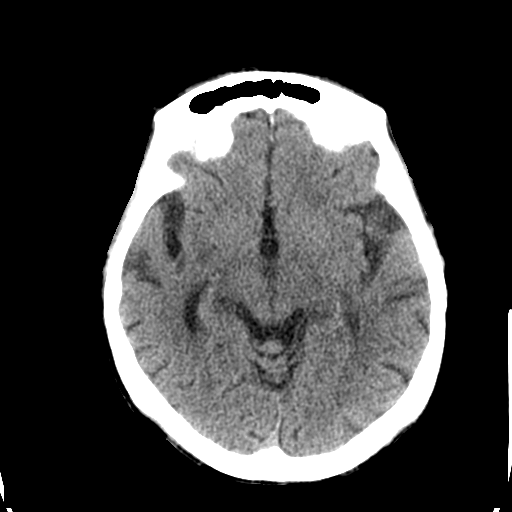
[im 17/33  brain]
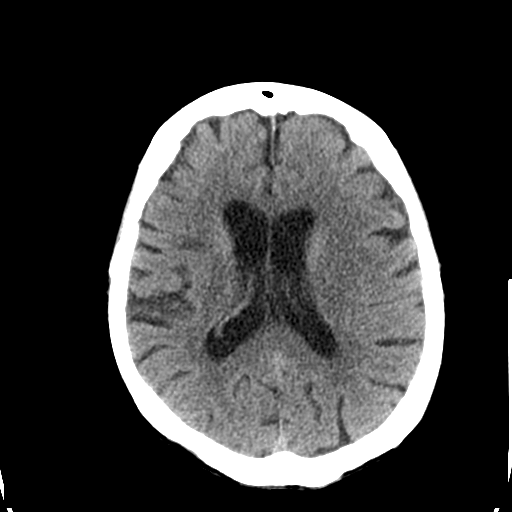
[im 17/33  bone]
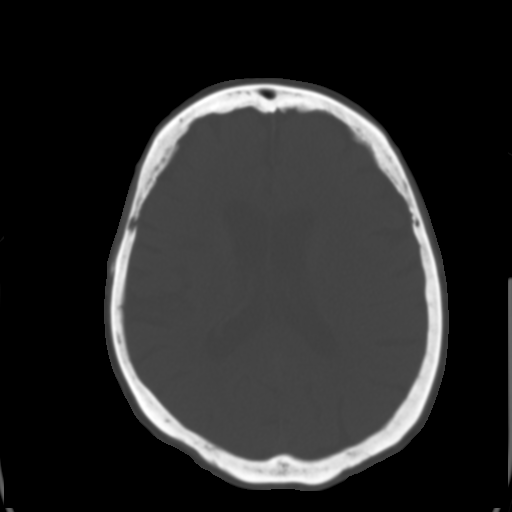
[im 20/33  brain]
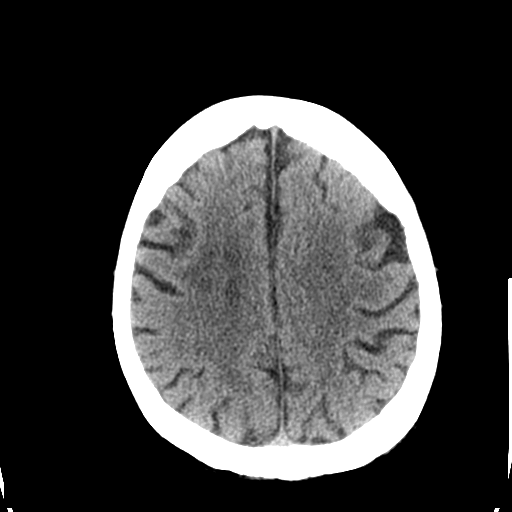
[im 24/33  brain]
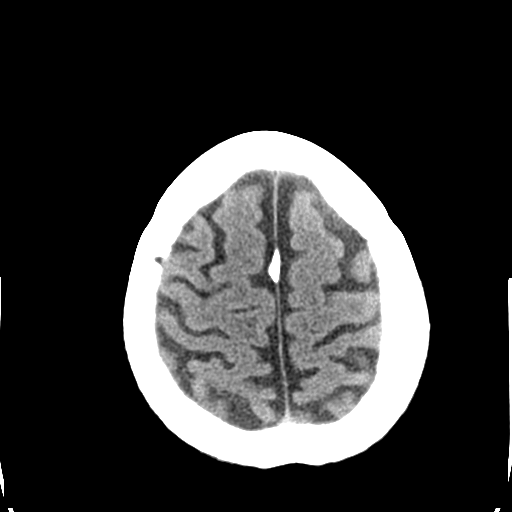
[im 27/33  brain]
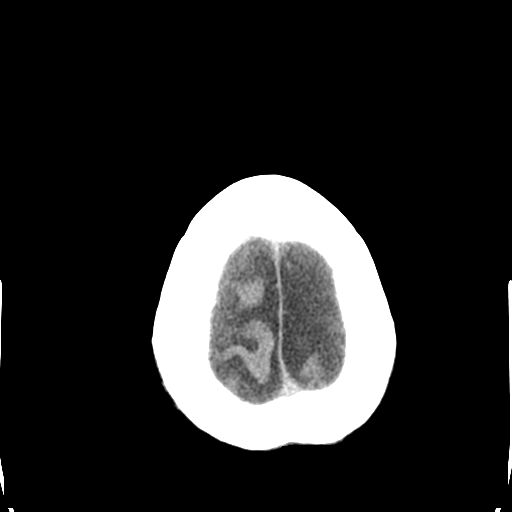
[im 30/33  brain]
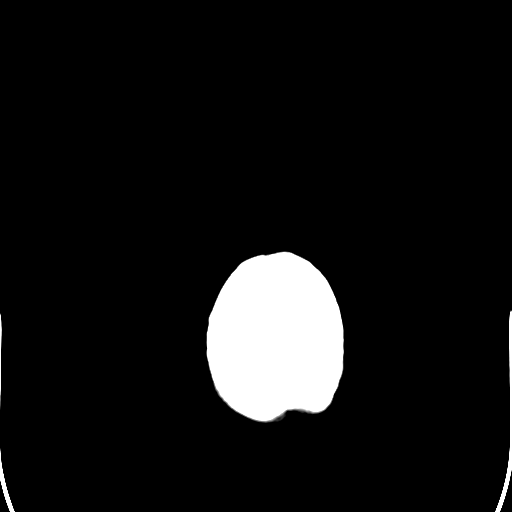
[im 30/33  bone]
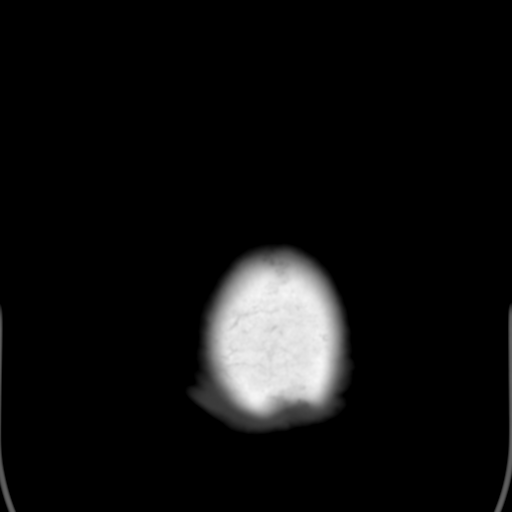

[Series 4: coronal soft · coronal · 0.35mm/px · 3 of 65 slices shown]
[im 22/65  brain]
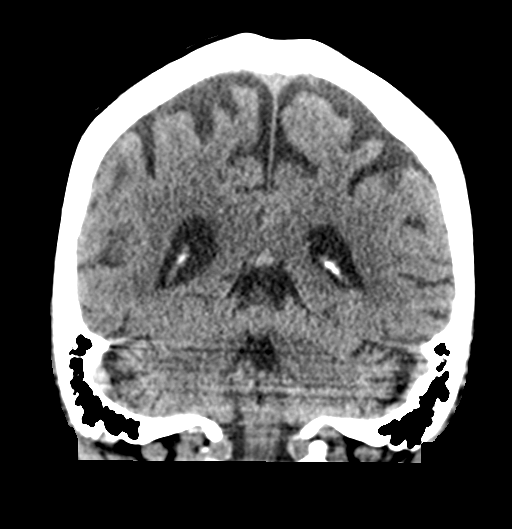
[im 29/65  brain]
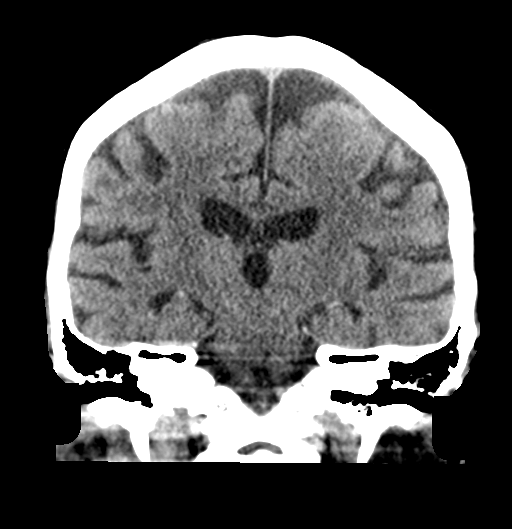
[im 36/65  brain]
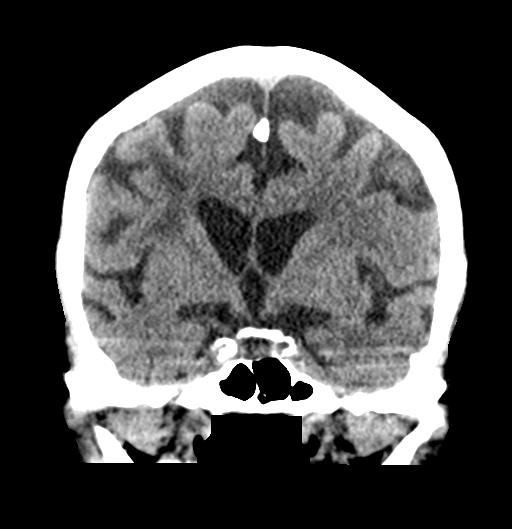

[Series 5: sagittal soft · sagittal · 0.37mm/px · 3 of 61 slices shown]
[im 21/61  brain]
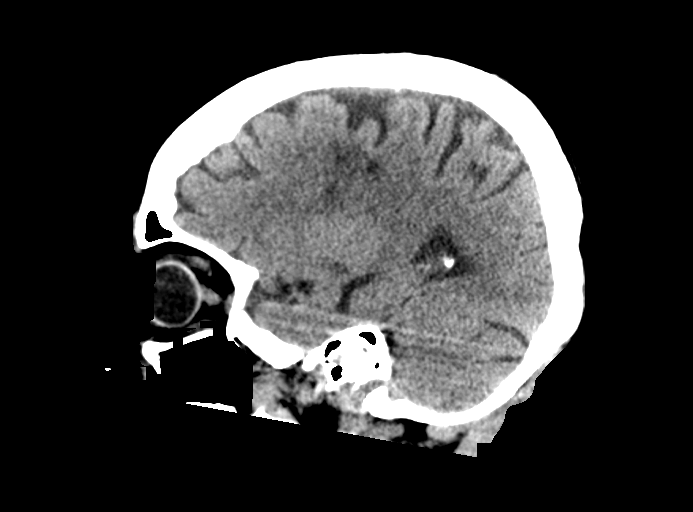
[im 31/61  brain]
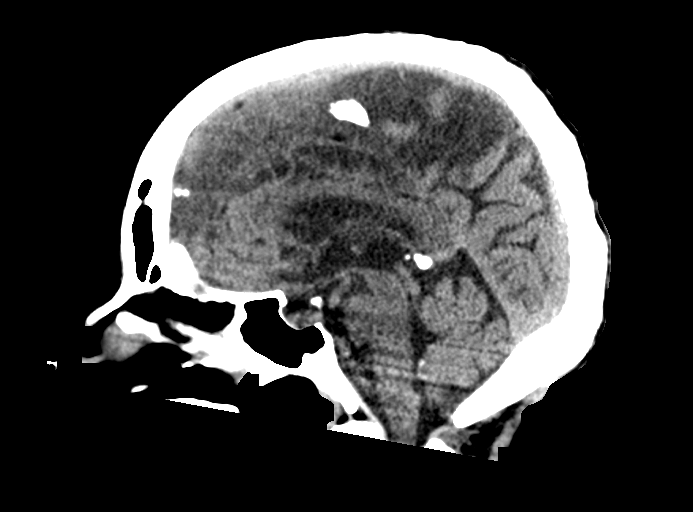
[im 41/61  brain]
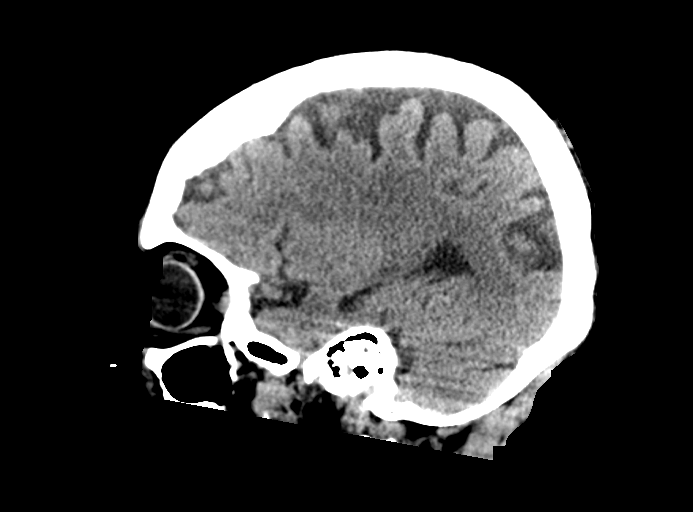

[15 of 47 positions shown; findings below may reference images not displayed]

FINDINGS: Brain: Diffuse cerebral atrophy. Low-attenuation changes in the deep
white matter consistent with small vessel ischemic change. Mild
ventricular dilatation consistent with central atrophy. There is a
focal area of low-attenuation in the right frontal region, unchanged
since prior study, likely an old infarct. No abnormal extra-axial
fluid collections. No mass effect or midline shift. Gray-white
matter junctions are distinct. Basal cisterns are not effaced. No
acute intracranial hemorrhage.

Vascular: No hyperdense vessel or unexpected calcification.

Skull: Normal. Negative for fracture or focal lesion.

Sinuses/Orbits: No acute finding.

Other: None.
IMPRESSION: 1. No acute intracranial abnormalities.
2. Chronic atrophy and small vessel ischemic changes. Old right
frontal infarct.
